# Patient Record
Sex: Female | Born: 1940 | Race: White | Hispanic: No | Smoking: Former smoker
Health system: Southern US, Community
[De-identification: ages and names within clinical notes are randomized; demographics above are authoritative.]

## PROBLEM LIST (undated history)

## (undated) DIAGNOSIS — I712 Thoracic aortic aneurysm, without rupture: Secondary | ICD-10-CM

## (undated) DIAGNOSIS — D649 Anemia, unspecified: Secondary | ICD-10-CM

## (undated) DIAGNOSIS — E119 Type 2 diabetes mellitus without complications: Secondary | ICD-10-CM

## (undated) DIAGNOSIS — R6889 Other general symptoms and signs: Secondary | ICD-10-CM

## (undated) DIAGNOSIS — J9601 Acute respiratory failure with hypoxia: Secondary | ICD-10-CM

## (undated) DIAGNOSIS — Z66 Do not resuscitate: Secondary | ICD-10-CM

## (undated) DIAGNOSIS — N390 Urinary tract infection, site not specified: Secondary | ICD-10-CM

## (undated) DIAGNOSIS — M1990 Unspecified osteoarthritis, unspecified site: Secondary | ICD-10-CM

## (undated) DIAGNOSIS — W19XXXA Unspecified fall, initial encounter: Secondary | ICD-10-CM

## (undated) DIAGNOSIS — L304 Erythema intertrigo: Secondary | ICD-10-CM

## (undated) DIAGNOSIS — K649 Unspecified hemorrhoids: Secondary | ICD-10-CM

## (undated) DIAGNOSIS — R918 Other nonspecific abnormal finding of lung field: Secondary | ICD-10-CM

## (undated) DIAGNOSIS — N3000 Acute cystitis without hematuria: Secondary | ICD-10-CM

## (undated) DIAGNOSIS — R911 Solitary pulmonary nodule: Secondary | ICD-10-CM

## (undated) DIAGNOSIS — F409 Phobic anxiety disorder, unspecified: Secondary | ICD-10-CM

## (undated) DIAGNOSIS — R410 Disorientation, unspecified: Secondary | ICD-10-CM

## (undated) DIAGNOSIS — I471 Supraventricular tachycardia: Secondary | ICD-10-CM

## (undated) DIAGNOSIS — E669 Obesity, unspecified: Secondary | ICD-10-CM

## (undated) DIAGNOSIS — R532 Functional quadriplegia: Secondary | ICD-10-CM

## (undated) DIAGNOSIS — Z593 Problems related to living in residential institution: Secondary | ICD-10-CM

## (undated) DIAGNOSIS — F32A Depression, unspecified: Secondary | ICD-10-CM

## (undated) DIAGNOSIS — J9602 Acute respiratory failure with hypercapnia: Secondary | ICD-10-CM

## (undated) DIAGNOSIS — F329 Major depressive disorder, single episode, unspecified: Secondary | ICD-10-CM

## (undated) DIAGNOSIS — I10 Essential (primary) hypertension: Secondary | ICD-10-CM

## (undated) DIAGNOSIS — A0811 Acute gastroenteropathy due to Norwalk agent: Secondary | ICD-10-CM

## (undated) DIAGNOSIS — I2721 Secondary pulmonary arterial hypertension: Secondary | ICD-10-CM

## (undated) DIAGNOSIS — M7752 Other enthesopathy of left foot: Secondary | ICD-10-CM

## (undated) DIAGNOSIS — D500 Iron deficiency anemia secondary to blood loss (chronic): Secondary | ICD-10-CM

## (undated) DIAGNOSIS — E785 Hyperlipidemia, unspecified: Secondary | ICD-10-CM

## (undated) DIAGNOSIS — R7303 Prediabetes: Secondary | ICD-10-CM

## (undated) HISTORY — DX: Iron deficiency anemia secondary to blood loss (chronic): D50.0

## (undated) HISTORY — DX: Major depressive disorder, single episode, unspecified: F32.9

## (undated) HISTORY — DX: Problems related to living in residential institution: Z59.3

## (undated) HISTORY — DX: Other enthesopathy of left foot and ankle: M77.52

## (undated) HISTORY — DX: Unspecified fall, initial encounter: W19.XXXA

## (undated) HISTORY — DX: Essential (primary) hypertension: I10

## (undated) HISTORY — DX: Hypercalcemia: E83.52

## (undated) HISTORY — DX: Urinary tract infection, site not specified: N39.0

## (undated) HISTORY — PX: KNEE SURGERY: SHX244

## (undated) HISTORY — DX: Unspecified osteoarthritis, unspecified site: M19.90

## (undated) HISTORY — DX: Hyperlipidemia, unspecified: E78.5

## (undated) HISTORY — DX: Do not resuscitate: Z66

## (undated) HISTORY — DX: Functional quadriplegia: R53.2

## (undated) HISTORY — DX: Type 2 diabetes mellitus without complications: E11.9

## (undated) HISTORY — DX: Acute respiratory failure with hypoxia: J96.02

## (undated) HISTORY — DX: Depression, unspecified: F32.A

## (undated) HISTORY — DX: Acute cystitis without hematuria: N30.00

## (undated) HISTORY — DX: Anemia, unspecified: D64.9

## (undated) HISTORY — DX: Obesity, unspecified: E66.9

## (undated) HISTORY — DX: Solitary pulmonary nodule: R91.1

## (undated) HISTORY — DX: Acute gastroenteropathy due to Norwalk agent: A08.11

## (undated) HISTORY — DX: Unspecified hemorrhoids: K64.9

## (undated) HISTORY — DX: Phobic anxiety disorder, unspecified: F40.9

## (undated) HISTORY — DX: Acute respiratory failure with hypoxia: J96.01

## (undated) HISTORY — DX: Disorientation, unspecified: R41.0

## (undated) HISTORY — DX: Other general symptoms and signs: R68.89

## (undated) HISTORY — DX: Erythema intertrigo: L30.4

---

## 2000-01-21 ENCOUNTER — Other Ambulatory Visit: Admission: RE | Admit: 2000-01-21 | Discharge: 2000-01-21 | Payer: Self-pay | Admitting: Obstetrics and Gynecology

## 2001-11-20 ENCOUNTER — Encounter: Admission: RE | Admit: 2001-11-20 | Discharge: 2002-02-18 | Payer: Self-pay | Admitting: Family Medicine

## 2004-05-12 ENCOUNTER — Encounter: Admission: RE | Admit: 2004-05-12 | Discharge: 2004-05-24 | Payer: Self-pay | Admitting: Family Medicine

## 2006-02-03 ENCOUNTER — Observation Stay (HOSPITAL_COMMUNITY): Admission: EM | Admit: 2006-02-03 | Discharge: 2006-02-04 | Payer: Self-pay | Admitting: Emergency Medicine

## 2006-05-30 ENCOUNTER — Inpatient Hospital Stay (HOSPITAL_COMMUNITY): Admission: EM | Admit: 2006-05-30 | Discharge: 2006-06-01 | Payer: Self-pay | Admitting: Emergency Medicine

## 2006-06-01 ENCOUNTER — Encounter (INDEPENDENT_AMBULATORY_CARE_PROVIDER_SITE_OTHER): Payer: Self-pay | Admitting: Specialist

## 2007-08-07 ENCOUNTER — Other Ambulatory Visit: Admission: RE | Admit: 2007-08-07 | Discharge: 2007-08-07 | Payer: Self-pay | Admitting: Obstetrics and Gynecology

## 2008-02-07 ENCOUNTER — Ambulatory Visit: Payer: Self-pay | Admitting: Oncology

## 2008-02-13 LAB — CBC WITH DIFFERENTIAL/PLATELET
BASO%: 0 % (ref 0.0–2.0)
EOS%: 2.8 % (ref 0.0–7.0)
HCT: 30.9 % — ABNORMAL LOW (ref 34.8–46.6)
LYMPH%: 11.2 % — ABNORMAL LOW (ref 14.0–48.0)
MCH: 27.8 pg (ref 26.0–34.0)
MCHC: 32.3 g/dL (ref 32.0–36.0)
MCV: 85.9 fL (ref 81.0–101.0)
MONO%: 5.3 % (ref 0.0–13.0)
NEUT%: 80.7 % — ABNORMAL HIGH (ref 39.6–76.8)
Platelets: 335 10*3/uL (ref 145–400)
RBC: 3.6 10*6/uL — ABNORMAL LOW (ref 3.70–5.32)

## 2008-02-18 LAB — COMPREHENSIVE METABOLIC PANEL
AST: 11 U/L (ref 0–37)
Alkaline Phosphatase: 108 U/L (ref 39–117)
BUN: 39 mg/dL — ABNORMAL HIGH (ref 6–23)
Creatinine, Ser: 1.37 mg/dL — ABNORMAL HIGH (ref 0.40–1.20)
Glucose, Bld: 123 mg/dL — ABNORMAL HIGH (ref 70–99)
Total Bilirubin: 0.3 mg/dL (ref 0.3–1.2)

## 2008-02-18 LAB — SPEP & IFE WITH QIG
Albumin ELP: 51.1 % — ABNORMAL LOW (ref 55.8–66.1)
Alpha-1-Globulin: 6.3 % — ABNORMAL HIGH (ref 2.9–4.9)
Alpha-2-Globulin: 13.7 % — ABNORMAL HIGH (ref 7.1–11.8)
Beta 2: 5.3 % (ref 3.2–6.5)
IgM, Serum: 90 mg/dL (ref 60–263)

## 2008-02-18 LAB — IRON AND TIBC
%SAT: 7 % — ABNORMAL LOW (ref 20–55)
Iron: 26 ug/dL — ABNORMAL LOW (ref 42–145)
TIBC: 349 ug/dL (ref 250–470)
UIBC: 323 ug/dL

## 2008-02-18 LAB — ERYTHROPOIETIN: Erythropoietin: 17.3 m[IU]/mL (ref 2.6–34.0)

## 2008-02-18 LAB — FERRITIN: Ferritin: 30 ng/mL (ref 10–291)

## 2008-04-21 ENCOUNTER — Ambulatory Visit: Payer: Self-pay | Admitting: Oncology

## 2008-05-09 LAB — COMPREHENSIVE METABOLIC PANEL
Albumin: 4 g/dL (ref 3.5–5.2)
BUN: 31 mg/dL — ABNORMAL HIGH (ref 6–23)
CO2: 26 mEq/L (ref 19–32)
Glucose, Bld: 108 mg/dL — ABNORMAL HIGH (ref 70–99)
Potassium: 4.4 mEq/L (ref 3.5–5.3)
Sodium: 143 mEq/L (ref 135–145)
Total Protein: 7.2 g/dL (ref 6.0–8.3)

## 2008-05-09 LAB — CBC WITH DIFFERENTIAL/PLATELET
Basophils Absolute: 0 10*3/uL (ref 0.0–0.1)
Eosinophils Absolute: 0.2 10*3/uL (ref 0.0–0.5)
HGB: 11.2 g/dL — ABNORMAL LOW (ref 11.6–15.9)
LYMPH%: 14.2 % (ref 14.0–49.7)
MCV: 94.6 fL (ref 79.5–101.0)
MONO#: 0.3 10*3/uL (ref 0.1–0.9)
MONO%: 4.8 % (ref 0.0–14.0)
NEUT#: 5.2 10*3/uL (ref 1.5–6.5)
Platelets: 283 10*3/uL (ref 145–400)
RBC: 3.57 10*6/uL — ABNORMAL LOW (ref 3.70–5.45)
RDW: 15 % — ABNORMAL HIGH (ref 11.2–14.5)
WBC: 6.8 10*3/uL (ref 3.9–10.3)

## 2008-05-09 LAB — IRON AND TIBC
Iron: 52 ug/dL (ref 42–145)
UIBC: 270 ug/dL

## 2008-05-09 LAB — FERRITIN: Ferritin: 66 ng/mL (ref 10–291)

## 2008-07-22 ENCOUNTER — Ambulatory Visit: Payer: Self-pay | Admitting: Oncology

## 2008-07-24 LAB — COMPREHENSIVE METABOLIC PANEL
AST: 11 U/L (ref 0–37)
Albumin: 4 g/dL (ref 3.5–5.2)
Alkaline Phosphatase: 89 U/L (ref 39–117)
Potassium: 4.2 mEq/L (ref 3.5–5.3)
Sodium: 138 mEq/L (ref 135–145)
Total Protein: 6.9 g/dL (ref 6.0–8.3)

## 2008-07-24 LAB — CBC WITH DIFFERENTIAL/PLATELET
EOS%: 4.9 % (ref 0.0–7.0)
MCH: 31.6 pg (ref 25.1–34.0)
MCV: 95.4 fL (ref 79.5–101.0)
MONO%: 5.6 % (ref 0.0–14.0)
NEUT#: 6.9 10*3/uL — ABNORMAL HIGH (ref 1.5–6.5)
RBC: 3.19 10*6/uL — ABNORMAL LOW (ref 3.70–5.45)
RDW: 13.4 % (ref 11.2–14.5)

## 2008-07-24 LAB — IRON AND TIBC: UIBC: 296 ug/dL

## 2008-10-24 ENCOUNTER — Ambulatory Visit: Payer: Self-pay | Admitting: Oncology

## 2008-10-28 LAB — COMPREHENSIVE METABOLIC PANEL
ALT: 10 U/L (ref 0–35)
BUN: 46 mg/dL — ABNORMAL HIGH (ref 6–23)
CO2: 21 mEq/L (ref 19–32)
Calcium: 10.2 mg/dL (ref 8.4–10.5)
Chloride: 105 mEq/L (ref 96–112)
Creatinine, Ser: 1.4 mg/dL — ABNORMAL HIGH (ref 0.40–1.20)
Glucose, Bld: 114 mg/dL — ABNORMAL HIGH (ref 70–99)

## 2008-10-28 LAB — CBC WITH DIFFERENTIAL/PLATELET
Basophils Absolute: 0 10*3/uL (ref 0.0–0.1)
EOS%: 2.4 % (ref 0.0–7.0)
HGB: 10.5 g/dL — ABNORMAL LOW (ref 11.6–15.9)
MCH: 31 pg (ref 25.1–34.0)
MCHC: 33.4 g/dL (ref 31.5–36.0)
MCV: 92.8 fL (ref 79.5–101.0)
MONO%: 4.7 % (ref 0.0–14.0)
NEUT%: 80.3 % — ABNORMAL HIGH (ref 38.4–76.8)
RDW: 14.4 % (ref 11.2–14.5)

## 2008-10-28 LAB — IRON AND TIBC
%SAT: 13 % — ABNORMAL LOW (ref 20–55)
Iron: 41 ug/dL — ABNORMAL LOW (ref 42–145)
UIBC: 284 ug/dL

## 2009-03-11 ENCOUNTER — Ambulatory Visit: Payer: Self-pay | Admitting: Oncology

## 2009-05-26 ENCOUNTER — Ambulatory Visit: Payer: Self-pay | Admitting: Oncology

## 2009-05-29 LAB — IRON AND TIBC
Iron: 23 ug/dL — ABNORMAL LOW (ref 42–145)
TIBC: 380 ug/dL (ref 250–470)
UIBC: 357 ug/dL

## 2009-05-29 LAB — COMPREHENSIVE METABOLIC PANEL
Albumin: 4.3 g/dL (ref 3.5–5.2)
Alkaline Phosphatase: 97 U/L (ref 39–117)
BUN: 40 mg/dL — ABNORMAL HIGH (ref 6–23)
Calcium: 10.4 mg/dL (ref 8.4–10.5)
Chloride: 103 mEq/L (ref 96–112)
Creatinine, Ser: 1.25 mg/dL — ABNORMAL HIGH (ref 0.40–1.20)
Glucose, Bld: 112 mg/dL — ABNORMAL HIGH (ref 70–99)
Potassium: 4.6 mEq/L (ref 3.5–5.3)

## 2009-05-29 LAB — CBC WITH DIFFERENTIAL/PLATELET
Basophils Absolute: 0 10*3/uL (ref 0.0–0.1)
EOS%: 2.8 % (ref 0.0–7.0)
Eosinophils Absolute: 0.3 10*3/uL (ref 0.0–0.5)
HCT: 29.6 % — ABNORMAL LOW (ref 34.8–46.6)
HGB: 9.9 g/dL — ABNORMAL LOW (ref 11.6–15.9)
MCH: 30.4 pg (ref 25.1–34.0)
MCV: 90.3 fL (ref 79.5–101.0)
MONO%: 4.7 % (ref 0.0–14.0)
NEUT#: 7 10*3/uL — ABNORMAL HIGH (ref 1.5–6.5)
NEUT%: 77.4 % — ABNORMAL HIGH (ref 38.4–76.8)
RDW: 13.8 % (ref 11.2–14.5)

## 2009-07-21 ENCOUNTER — Ambulatory Visit: Payer: Self-pay | Admitting: Oncology

## 2009-07-22 LAB — CBC WITH DIFFERENTIAL/PLATELET
BASO%: 0.2 % (ref 0.0–2.0)
EOS%: 2.1 % (ref 0.0–7.0)
HCT: 35.9 % (ref 34.8–46.6)
MCH: 30.1 pg (ref 25.1–34.0)
MCHC: 32.8 g/dL (ref 31.5–36.0)
MONO#: 0.6 10*3/uL (ref 0.1–0.9)
NEUT%: 72.8 % (ref 38.4–76.8)
RDW: 17.7 % — ABNORMAL HIGH (ref 11.2–14.5)
WBC: 8.9 10*3/uL (ref 3.9–10.3)
lymph#: 1.7 10*3/uL (ref 0.9–3.3)

## 2009-07-22 LAB — COMPREHENSIVE METABOLIC PANEL
ALT: 9 U/L (ref 0–35)
AST: 11 U/L (ref 0–37)
Albumin: 4.6 g/dL (ref 3.5–5.2)
CO2: 22 mEq/L (ref 19–32)
Calcium: 10.4 mg/dL (ref 8.4–10.5)
Chloride: 104 mEq/L (ref 96–112)
Potassium: 4.4 mEq/L (ref 3.5–5.3)
Sodium: 140 mEq/L (ref 135–145)
Total Protein: 7.9 g/dL (ref 6.0–8.3)

## 2009-07-22 LAB — IRON AND TIBC: TIBC: 300 ug/dL (ref 250–470)

## 2009-07-22 LAB — FERRITIN: Ferritin: 210 ng/mL (ref 10–291)

## 2009-08-31 ENCOUNTER — Ambulatory Visit: Payer: Self-pay | Admitting: Oncology

## 2009-09-02 LAB — CBC WITH DIFFERENTIAL/PLATELET
BASO%: 0.2 % (ref 0.0–2.0)
LYMPH%: 14.9 % (ref 14.0–49.7)
MCHC: 33.8 g/dL (ref 31.5–36.0)
MCV: 91.7 fL (ref 79.5–101.0)
MONO%: 5.7 % (ref 0.0–14.0)
NEUT%: 76.9 % — ABNORMAL HIGH (ref 38.4–76.8)
Platelets: 271 10*3/uL (ref 145–400)
RBC: 3.98 10*6/uL (ref 3.70–5.45)

## 2009-09-02 LAB — IRON AND TIBC
%SAT: 15 % — ABNORMAL LOW (ref 20–55)
UIBC: 265 ug/dL

## 2009-09-02 LAB — FERRITIN: Ferritin: 102 ng/mL (ref 10–291)

## 2009-09-02 LAB — COMPREHENSIVE METABOLIC PANEL
ALT: <8 U/L (ref 0–35)
Alkaline Phosphatase: 87 U/L (ref 39–117)
Creatinine, Ser: 1.2 mg/dL (ref 0.40–1.20)
Glucose, Bld: 114 mg/dL — ABNORMAL HIGH (ref 70–99)
Sodium: 139 mEq/L (ref 135–145)
Total Bilirubin: 0.3 mg/dL (ref 0.3–1.2)
Total Protein: 7.7 g/dL (ref 6.0–8.3)

## 2009-11-06 ENCOUNTER — Ambulatory Visit: Payer: Self-pay | Admitting: Oncology

## 2009-11-10 LAB — CBC WITH DIFFERENTIAL/PLATELET
BASO%: 0.4 % (ref 0.0–2.0)
EOS%: 2.7 % (ref 0.0–7.0)
HCT: 35.4 % (ref 34.8–46.6)
LYMPH%: 17.2 % (ref 14.0–49.7)
MCH: 31.5 pg (ref 25.1–34.0)
MCHC: 33.2 g/dL (ref 31.5–36.0)
MONO#: 0.4 10*3/uL (ref 0.1–0.9)
NEUT%: 73.7 % (ref 38.4–76.8)
Platelets: 259 10*3/uL (ref 145–400)
RBC: 3.74 10*6/uL (ref 3.70–5.45)
WBC: 7.4 10*3/uL (ref 3.9–10.3)

## 2009-11-10 LAB — COMPREHENSIVE METABOLIC PANEL
ALT: 9 U/L (ref 0–35)
AST: 14 U/L (ref 0–37)
Albumin: 4.2 g/dL (ref 3.5–5.2)
Alkaline Phosphatase: 88 U/L (ref 39–117)
Calcium: 10.1 mg/dL (ref 8.4–10.5)
Creatinine, Ser: 1.36 mg/dL — ABNORMAL HIGH (ref 0.40–1.20)
Sodium: 140 mEq/L (ref 135–145)
Total Bilirubin: 0.4 mg/dL (ref 0.3–1.2)
Total Protein: 7.2 g/dL (ref 6.0–8.3)

## 2009-11-10 LAB — IRON AND TIBC
Iron: 65 ug/dL (ref 42–145)
UIBC: 254 ug/dL

## 2009-11-10 LAB — FERRITIN: Ferritin: 92 ng/mL (ref 10–291)

## 2009-12-28 ENCOUNTER — Ambulatory Visit: Payer: Self-pay | Admitting: Oncology

## 2009-12-30 LAB — COMPREHENSIVE METABOLIC PANEL
ALT: 11 U/L (ref 0–35)
AST: 13 U/L (ref 0–37)
Alkaline Phosphatase: 92 U/L (ref 39–117)
BUN: 28 mg/dL — ABNORMAL HIGH (ref 6–23)
Chloride: 102 mEq/L (ref 96–112)
Creatinine, Ser: 1.17 mg/dL (ref 0.40–1.20)
Total Bilirubin: 0.6 mg/dL (ref 0.3–1.2)

## 2009-12-30 LAB — CBC WITH DIFFERENTIAL/PLATELET
BASO%: 0.4 % (ref 0.0–2.0)
Basophils Absolute: 0 10*3/uL (ref 0.0–0.1)
EOS%: 1.5 % (ref 0.0–7.0)
HCT: 36.3 % (ref 34.8–46.6)
LYMPH%: 15.9 % (ref 14.0–49.7)
MCH: 32.3 pg (ref 25.1–34.0)
MCHC: 34 g/dL (ref 31.5–36.0)
MCV: 95 fL (ref 79.5–101.0)
MONO%: 6.5 % (ref 0.0–14.0)
NEUT%: 75.7 % (ref 38.4–76.8)
lymph#: 1.4 10*3/uL (ref 0.9–3.3)

## 2009-12-30 LAB — IRON AND TIBC
%SAT: 17 % — ABNORMAL LOW (ref 20–55)
TIBC: 324 ug/dL (ref 250–470)

## 2009-12-30 LAB — FERRITIN: Ferritin: 74 ng/mL (ref 10–291)

## 2010-04-12 ENCOUNTER — Encounter: Payer: Self-pay | Admitting: Family Medicine

## 2010-05-05 ENCOUNTER — Other Ambulatory Visit: Payer: Self-pay | Admitting: Oncology

## 2010-05-05 ENCOUNTER — Encounter (HOSPITAL_BASED_OUTPATIENT_CLINIC_OR_DEPARTMENT_OTHER): Payer: Medicare Other | Admitting: Oncology

## 2010-05-05 DIAGNOSIS — D509 Iron deficiency anemia, unspecified: Secondary | ICD-10-CM

## 2010-05-05 DIAGNOSIS — D649 Anemia, unspecified: Secondary | ICD-10-CM

## 2010-05-05 DIAGNOSIS — N289 Disorder of kidney and ureter, unspecified: Secondary | ICD-10-CM

## 2010-05-05 DIAGNOSIS — D638 Anemia in other chronic diseases classified elsewhere: Secondary | ICD-10-CM

## 2010-05-05 LAB — CBC WITH DIFFERENTIAL/PLATELET
Basophils Absolute: 0 10*3/uL (ref 0.0–0.1)
Eosinophils Absolute: 0.3 10*3/uL (ref 0.0–0.5)
HGB: 12.9 g/dL (ref 11.6–15.9)
MCV: 93.5 fL (ref 79.5–101.0)
MONO%: 6.1 % (ref 0.0–14.0)
NEUT#: 6.1 10*3/uL (ref 1.5–6.5)
RBC: 4.14 10*6/uL (ref 3.70–5.45)
RDW: 14.2 % (ref 11.2–14.5)
WBC: 8.4 10*3/uL (ref 3.9–10.3)
lymph#: 1.4 10*3/uL (ref 0.9–3.3)

## 2010-05-05 LAB — IRON AND TIBC
%SAT: 16 % — ABNORMAL LOW (ref 20–55)
Iron: 56 ug/dL (ref 42–145)
UIBC: 287 ug/dL

## 2010-05-05 LAB — FERRITIN: Ferritin: 48 ng/mL (ref 10–291)

## 2010-08-06 NOTE — H&P (Signed)
[name], [name]                 ACCOUNT NO.:  [id number]   MEDICAL RECORD NO.:  [id number]          [name] TYPE:  INP   LOCATION:  1416                         FACILITY:  [facility]   PHYSICIAN:  Andres Shad. Lantos, MD     DATE OF BIRTH:  10/22/1940   DATE OF PROCEDURE:  DATE OF DISCHARGE:                    STAT - MUST CHANGE TO CORRECT WORK TYPE   CHIEF COMPLAINT:  Shortness of breath and severe anemia.   HISTORY OF PRESENT ILLNESS:  [name] is a 70 year old female who  presented to her primary care physician today for a physical.  During  the physical, she complained of being very short of breath, especially  with exertion over the last month and a half.  He performed some blood  work and found that she had a hemoglobin of 3.7 and referred her for  admission.  The [name] was an inpatient on the [facility] in November 2007 with a similar complaint.  At that time she was  found to have a hemoglobin of approximately 7 and heme-positive stools.  The [name] was having bloody bowel movements at the time that she  attributed to external hemorrhoids and a rectal prolapse.  She was seen  by gastroenterology during that admission who felt that she was most  likely having chronic blood loss from her hemorrhoids and referred her  for an outpatient colonoscopy.  She was transfused during that admission  and discharged in stable condition.  However, her husband passed away  several days after she was discharged and she was unable to keep her  colonoscopy appointment.  She has been physically reasonably well since  that time until about one to one and a half months ago when she began to  have progressive shortness of breath, especially with minimal exertion.  She has always felt short of breath while lying flat on her back and  feels that this is unchanged.  She more recently has been short of  breath even at rest.   REVIEW OF SYSTEMS:  Positive for headache, especially when  she is lying  supine.  Positive for cold intolerance.  Negative for diarrhea, negative  for blood in her stools, negative for hematemesis, negative for melena.   PAST MEDICAL HISTORY:  1. Diet controlled diabetes mellitus.  2. Stress incontinence.  3. Hypertension.  4. Gastroesophageal reflux disease.  5. Hyperlipidemia.  6. Diverticulosis.  7. Obesity.  8. Osteoarthritis.  9. Anxiety and depression.  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   MEDICATIONS:  1. Maxzide 75/50 mg p.o. daily.  2. Tylenol 1 g p.o. q.6 h. p.r.n.  3. Methadone 5 mg p.o. q.i.d. p.r.n.  4. Tenex 2 mg p.o. daily.  5. Diovan 80 mg p.o. daily.  6. MiraLax 17 g p.o. daily.   ALLERGIES/INTOLERANCES:  1. PENICILLIN.  2. ASPIRIN.  3. DARVOCET.  4. HYDROCODONE.  5. CODEINE.  6. LIPITOR.  7. STADOL.  8. AVANDIA.  9. ACTOS.  10.ZETIA.  11.ACE INHIBITORS.  12.WELCHOL.   SOCIAL HISTORY:  The [name] does not smoke.  She denies drinking and  drug use.  She is a  former nurses' aide.   PHYSICAL EXAMINATION:  VITAL SIGNS:  Temperature 98.4, pulse 73,  respiratory rate 20, blood pressure 130/48, oxygen saturation 94-96% on  room air.  Weight 113 kg.  Height 60 inches.  GENERAL APPEARANCE:  The [name] was an obese, Caucasian female, who was  quite pale.  She was alert, oriented and in no apparent distress.  HEENT:  Mucous membranes were pale and moist.  NECK:  There was no jugular venous distension.  CHEST:  Clear to auscultation bilaterally.  CARDIOVASCULAR:  Regular.  Hyperdynamic.  No murmurs, rubs or gallops.  ABDOMEN:  Obese.  Normal bowel sounds.  Soft, nontender, nondistended.  EXTREMITIES:  No edema.  Warm. Well perfused.  RECTAL:  The [name] had three large, visible external hemorrhoids.  None  of the hemorrhoids were bleeding and none were thrombosed.   LABORATORY DATA:  White blood cell count 15.2, hemoglobin 4.1,  hematocrit 13.1, platelets 514,000, 86% polys, 10% lymphocytes, 4%  monocytes  and MCV 71.6, RDW 22.5.  Blood smear showed polychromasia with  reticulocyte count of 2.9%.  Coagulation studies were normal.  Electrolytes were within normal limits.  Creatinine was 1.35. The  previous one checked at this hospital was 1.2 in November.   ASSESSMENT/PLAN:  1. Severe anemia.  This is most likely an iron deficiency anemia due      to chronic blood loss. This is suggestive by her very low MCV and      very elevated RDW.  Plan:  I will transfuse the [name] 4 units of      packed red blood cells and give a dose of Lasix to prevent volume      overload.  I will begin the [name] on an iron supplement, 325 mg      twice daily of iron sulfate.  I will also consult gastroenterology.      Additionally, the [name] will remain on her daily MiraLax which      she says prevents her hemorrhoids from bleeding and will begin her      on a proton pump inhibitor.  2. Hypertension.  This is stable and will be monitored.  3. Diabetes mellitus.  The [name] is requesting a regular diet and      says that she is ordinarily under good control.  I will give her      normal diet but check blood sugars four times a daily and restrict      her diet if these become elevated.      Andres Shad. Rudean Curt, MD  Electronically Signed     PML/MEDQ  D:  05/30/2006  T:  06/01/2006  Job:  [id number]   cc:   Fayrene Fearing L. Malon Kindle., M.D.  Fax: [phone]   Chales Salmon. Abigail Miyamoto, M.D.  Fax: [phone]

## 2010-08-06 NOTE — Op Note (Signed)
[name], [name]                 ACCOUNT NO.:  [id number]   MEDICAL RECORD NO.:  [id number]          PATIENT TYPE:  INP   LOCATION:  1416                         FACILITY:  [facility]   PHYSICIAN:  Bernette Redbird, M.D.   DATE OF BIRTH:  1940/12/19   DATE OF PROCEDURE:  06/01/2006  DATE OF DISCHARGE:  06/01/2006                               OPERATIVE REPORT   PROCEDURE:  Upper endoscopy with biopsies.   INDICATIONS:  Profound microcytic anemia in a 70 year old female with a  prior history of recurrent anemia, she is not on any ulcerogenic  medications.   FINDINGS:  Minimal hiatal hernia with Schatzki's ring.   DESCRIPTION OF PROCEDURE:  The nature, purpose, and risks of the  procedure had been discussed with the patient who provided written  consent.  Sedation was per anesthesia and included propofol, Versed, and  a short acting form of a fentanyl derivative.  The Pentax video  endoscope was passed under direct vision.  The vocal cords looked  grossly normal.  The esophagus was readily entered and was  endoscopically normal except for a widely patent esophageal mucosal ring  at the squamocolumnar junction, below which was a 2 cm hiatal hernia.  No reflux esophagitis, Barrett's esophagus, varices, infection or  neoplasia were seen.   The stomach contained a small bilious residual.  No significant  gastritis or any focal lesion such as erosions, ulcers, polyps or masses  were seen.  The pylorus, duodenal bulb and second duodenum looked  normal.  Duodenal biopsies were obtained to help exclude celiac disease,  amyloidosis, etc.  The endoscope was then removed from the patient who  tolerated the procedure well without apparent complication.   IMPRESSION:  Essentially normal endoscopy without source of anemia seen.  Small hiatal hernia and Schatzki's ring not felt to be clinically  significant.   PLAN:  Await pathology and duodenal biopsies and proceed to colonoscopic   evaluation.           ______________________________  Bernette Redbird, M.D.     RB/MEDQ  D:  06/01/2006  T:  06/02/2006  Job:  [id number]   cc:   Chales Salmon. Abigail Miyamoto, M.D.  Fax: [phone]

## 2010-08-06 NOTE — Discharge Summary (Signed)
[name], [name]                 ACCOUNT NO.:  [id number]   MEDICAL RECORD NO.:  [id number]          PATIENT TYPE:  OBV   LOCATION:  1603                         FACILITY:  [facility]   PHYSICIAN:  Theone Stanley, MD   DATE OF BIRTH:  [birth year]   DATE OF ADMISSION:  02/03/2006  DATE OF DISCHARGE:  02/04/2006                               DISCHARGE SUMMARY   ADMISSION DIAGNOSES:  1. Critical anemia.  2. Hypertension.  3. Gastroesophageal reflux disease.  4. Hyperlipidemia.  5. Diabetes type 2.  6. Morbid obesity.  7. Osteoarthritis.  8. Degenerative disk disease.   DISCHARGE DIAGNOSES:  1. Critical anemia secondary to gastrointestinal loss, most likely      from hemorrhoids.  2. Hypertension.  3. Gastroesophageal reflux disease.  4. Hyperlipidemia.  5. Diabetes type 2.  6. Morbid obesity.  7. Osteoarthritis.  8. Degenerative disk disease.   CONSULTATIONS:  None.   PROCEDURES/DIAGNOSTIC TESTS:  None.   HOSPITAL COURSE:  Ms. Korf is a very pleasant 70 year old Caucasian  female who presents to her primary care physician's office for a routine  exam where lab work was obtained.  It was noted that she has critical  anemia.  She was asked to return to the office for confirmatory repeat  CBC, which also indicated that she had critical anemia.  Although she  was not very symptomatic with shortness of breath, she did have some  tachycardia on presentation to the ER.  She was transfused 2 units.  Her  hemoglobin came up to 9.7.  I was able to talk with Dr. Ewing Schlein.  Because  there was no urgent need at this point in time, it was felt she could  have a follow-up colonoscopy as an outpatient.  In addition, patient was  very anxious to go home because her husband recently had surgery, and  she was very worried about him.  She is a very reliable patient, and it  was felt that she could follow up with Dr. Randa Evens on an outpatient  basis for a colonoscopy since her last one was about  5-6 years ago.   Follow up with Dr. Abigail Miyamoto in 1-2 weeks, Dr. Randa Evens in 2-3 weeks.   DISCHARGE MEDICATIONS:  1. Maxzide 75/50 1 p.o. daily.  2. Diovan 80 mg 1 p.o. daily.  3. Tenex 2 mg 1 p.o. daily.  4. Methadone 5 mg q.i.d.      Theone Stanley, MD  Electronically Signed     AEJ/MEDQ  D:  02/04/2006  T:  02/04/2006  Job:  [id number]   cc:   Chales Salmon. Abigail Miyamoto, M.D.  Fax: [phone]   Llana Aliment. Malon Kindle., M.D.  Fax: [phone]

## 2010-08-06 NOTE — Consult Note (Signed)
[name], [name]                 ACCOUNT NO.:  [id number]   MEDICAL RECORD NO.:  [id number]          PATIENT TYPE:  INP   LOCATION:  1416                         FACILITY:  [facility]   PHYSICIAN:  Bernette Redbird, M.D.   DATE OF BIRTH:  10-Mar-1941   DATE OF CONSULTATION:  05/30/2006  DATE OF DISCHARGE:                                 CONSULTATION   GASTROENTEROLOGY CONSULTATION   HISTORY OF PRESENT ILLNESS:  Dr. Rudean Curt of the [facility] hospitalists asked  [name] to see this pleasant 70 year old female because of profound anemia.   [name] was admitted to the hospital, today, from her physician's  office where she had been found to have a hemoglobin of about 3.6; and,  on admission here, it was 4.1.  She had had critical anemia back in  November; and was transfused 2 units with a discharge hemoglobin of 9.7;  I do not have the value for what that actual hemoglobin was.  Unfortunately, the patient's husband passed away shortly, thereafter,  and her plans for follow-up colonoscopy got put on hold; and she  presented to Dr. Recardo Evangelist office today with weakness and shortness of  breath; and was found to have the above-mentioned low hemoglobin.   Of note, the patient has longstanding small-volume hematochezia  attributed to hemorrhoids.  The blood occurs only on the commode, not at  other times.  She has had a tendency toward constipation which is well  controlled by MiraLax.  The patient was seen by Dr. Randa Evens a year ago  with the rectal bleeding and colonoscopy was recommended at that time;  but she did not follow through, apparently due to looking after her  husband.   She is not having any problem with abdominal pain.  She does have a fair  amount of dyspeptic symptoms, but is not on prescription medication for  that.  She does take Tums regularly.  She is not on any ulcerogenic  medications.   The patient had colonoscopy by Dr. Randa Evens 10 years ago in 1997, at  which time  diverticulosis and internal hemorrhoids were noted, but no  polyps.   ALLERGIES/INTOLERANCE:  To multiple medications including PENICILLIN,  LIPITOR, STADOL, ASPIRIN, AVANDIA, ACTOS, CODEINE, WELCHOL, ACE  inhibitors, ZETIA, VICODIN and DARVOCET.   OUTPATIENT MEDICATIONS PRIOR TO ADMISSION INCLUDED:  Maxzide, methadone,  Tenex and Diovan.   OPERATIONS INCLUDE:  Hysterectomy, a subsequent bladder suspension  procedure, also umbilical hernia repair on multiple occasions with mesh  on the latter occasion; and she has had some sort of orthopedic surgery.   MEDICAL ILLNESSES INCLUDE:  1. Type 2 diabetes currently diet controlled.  2. Recurrent anemia.  3. GERD symptoms.  4. Hypertension.  5. Hyperlipidemia.  6. Morbid obesity.  7. Peripheral neuropathy.  8. Stress urinary incontinence.   HABITS:  Nonsmoker, nondrinker.   FAMILY HISTORY:  Negative for GI illnesses such as colon cancer or ulcer  disease.   SOCIAL HISTORY:  As noted, the patient has just been widowed for the  past several months.  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   PHYSICAL EXAM:  GENERAL:  A morbidly obese very pleasant, articulate  Caucasian female in no evident distress.  She is anicteric.  Despite her  profound anemia, she does not really look all that pale.  She has  received part of 1 unit of blood so far.  CHEST:  Clear to auscultation.  HEART:  Normal without gallops, rubs, murmurs, clicks, or arrhythmias.  ABDOMEN:  Morbidly obese but I cannot discern any discrete ventral  hernia.  RECTAL:  I did not repeat a rectal exam, but when the physician  assistant did it earlier, there did appear to be just a tinge of mucoid  blood, with no stool present.  The residue was strongly guaiac positive.   LABS:  White count 15,200 with 86 polys, 10 lymphs, and 4 monocytes.  Hemoglobin 4.1 with an MCV of 72, platelets 514,000, RDW elevated at  22.5.  INR  normal at 1.1.  Chemistry panel pertinent for BUN 34,  creatinine 1.4.  Liver chemistries not checked glucose 117.   IMPRESSION:  1. Chronic recurrent microcytic anemia.  2. Longstanding small-volume hematochezia.  3. Recurrent small volume hematochezia most likely due to hemorrhoids.  4. Multiple medical problems including morbid obesity.   DISCUSSION:  I do not think the patient's small volume hematochezia  accounts for such profound anemia.  I thus suspect either chronic  gastrointestinal blood loss from a lower gastrointestinal tract lesion  such as a tumor, or alternatively, perhaps an upper tract source such as  a large hiatal hernia.   PLAN:  The patient will be transfused, tonight, undergo a colonoscopy  prep tomorrow; and then undergo colonoscopy, hopefully under propofol  sedation, the day after.  I have reviewed the nature, purpose, and risks  of the procedures with the patient and she is agreeable.           ______________________________  Bernette Redbird, M.D.     RB/MEDQ  D:  05/30/2006  T:  06/01/2006  Job:  [id number]   cc:   Chales Salmon. Abigail Miyamoto, M.D.  Fax: [phone]   Llana Aliment. Malon Kindle., M.D.  Fax: [phone]

## 2010-08-12 ENCOUNTER — Other Ambulatory Visit: Payer: Self-pay | Admitting: Medical

## 2010-08-12 ENCOUNTER — Encounter (HOSPITAL_BASED_OUTPATIENT_CLINIC_OR_DEPARTMENT_OTHER): Payer: Medicare Other | Admitting: Oncology

## 2010-08-12 DIAGNOSIS — D509 Iron deficiency anemia, unspecified: Secondary | ICD-10-CM

## 2010-08-12 DIAGNOSIS — D649 Anemia, unspecified: Secondary | ICD-10-CM

## 2010-08-12 DIAGNOSIS — N289 Disorder of kidney and ureter, unspecified: Secondary | ICD-10-CM

## 2010-08-12 DIAGNOSIS — D638 Anemia in other chronic diseases classified elsewhere: Secondary | ICD-10-CM

## 2010-08-12 LAB — CBC WITH DIFFERENTIAL/PLATELET
BASO%: 0.8 % (ref 0.0–2.0)
EOS%: 3.1 % (ref 0.0–7.0)
LYMPH%: 15.9 % (ref 14.0–49.7)
MCHC: 32.9 g/dL (ref 31.5–36.0)
MCV: 91.5 fL (ref 79.5–101.0)
MONO%: 5.2 % (ref 0.0–14.0)
Platelets: 256 10*3/uL (ref 145–400)
RBC: 4.1 10*6/uL (ref 3.70–5.45)
RDW: 14.4 % (ref 11.2–14.5)

## 2010-08-12 LAB — IRON AND TIBC
%SAT: 13 % — ABNORMAL LOW (ref 20–55)
Iron: 45 ug/dL (ref 42–145)

## 2010-08-12 LAB — FERRITIN: Ferritin: 44 ng/mL (ref 10–291)

## 2010-11-16 ENCOUNTER — Encounter (HOSPITAL_BASED_OUTPATIENT_CLINIC_OR_DEPARTMENT_OTHER): Payer: Medicare Other | Admitting: Oncology

## 2010-11-16 ENCOUNTER — Other Ambulatory Visit: Payer: Self-pay | Admitting: Oncology

## 2010-11-16 DIAGNOSIS — D649 Anemia, unspecified: Secondary | ICD-10-CM

## 2010-11-16 DIAGNOSIS — D509 Iron deficiency anemia, unspecified: Secondary | ICD-10-CM

## 2010-11-16 DIAGNOSIS — N289 Disorder of kidney and ureter, unspecified: Secondary | ICD-10-CM

## 2010-11-16 DIAGNOSIS — D638 Anemia in other chronic diseases classified elsewhere: Secondary | ICD-10-CM

## 2010-11-16 LAB — COMPREHENSIVE METABOLIC PANEL
Albumin: 4.3 g/dL (ref 3.5–5.2)
BUN: 24 mg/dL — ABNORMAL HIGH (ref 6–23)
Calcium: 10.3 mg/dL (ref 8.4–10.5)
Chloride: 100 mEq/L (ref 96–112)
Glucose, Bld: 102 mg/dL — ABNORMAL HIGH (ref 70–99)
Potassium: 4 mEq/L (ref 3.5–5.3)

## 2010-11-16 LAB — IRON AND TIBC
TIBC: 369 ug/dL (ref 250–470)
UIBC: 312 ug/dL (ref 125–400)

## 2010-11-16 LAB — FERRITIN: Ferritin: 48 ng/mL (ref 10–291)

## 2010-11-16 LAB — CBC WITH DIFFERENTIAL/PLATELET
Basophils Absolute: 0 10*3/uL (ref 0.0–0.1)
EOS%: 2.6 % (ref 0.0–7.0)
HCT: 40.8 % (ref 34.8–46.6)
HGB: 13.2 g/dL (ref 11.6–15.9)
LYMPH%: 16.6 % (ref 14.0–49.7)
MCH: 29.2 pg (ref 25.1–34.0)
MONO#: 0.7 10*3/uL (ref 0.1–0.9)
NEUT%: 73.2 % (ref 38.4–76.8)
Platelets: 257 10*3/uL (ref 145–400)
lymph#: 1.6 10*3/uL (ref 0.9–3.3)

## 2011-01-31 ENCOUNTER — Telehealth: Payer: Self-pay | Admitting: Oncology

## 2011-01-31 NOTE — Telephone Encounter (Signed)
S/w pt, advised 11/29 appt has been cancelled due to Epic Go-Live. Advised pt, we will call her later with a new appt. Pt verbalize understanding.

## 2011-02-05 ENCOUNTER — Telehealth: Payer: Self-pay | Admitting: Oncology

## 2011-02-05 NOTE — Telephone Encounter (Signed)
S/w pt, gave appt 03/31/11 @ 9am r/s'd from 11/29 appt (cx'd due to Epic).

## 2011-02-08 ENCOUNTER — Telehealth: Payer: Self-pay | Admitting: Oncology

## 2011-02-08 NOTE — Telephone Encounter (Signed)
Returned pt's call and r/s 1/10 appt to 1/17 @ 9 am.  D/t per pt.

## 2011-03-31 ENCOUNTER — Other Ambulatory Visit: Payer: Medicare Other | Admitting: Lab

## 2011-03-31 ENCOUNTER — Ambulatory Visit: Payer: Medicare Other | Admitting: Oncology

## 2011-04-04 ENCOUNTER — Encounter: Payer: Self-pay | Admitting: *Deleted

## 2011-04-07 ENCOUNTER — Ambulatory Visit: Payer: Medicare Other | Admitting: Oncology

## 2011-04-07 ENCOUNTER — Other Ambulatory Visit: Payer: Medicare Other | Admitting: Lab

## 2011-05-25 ENCOUNTER — Other Ambulatory Visit (HOSPITAL_BASED_OUTPATIENT_CLINIC_OR_DEPARTMENT_OTHER): Payer: Medicare Other | Admitting: Lab

## 2011-05-25 ENCOUNTER — Ambulatory Visit (HOSPITAL_BASED_OUTPATIENT_CLINIC_OR_DEPARTMENT_OTHER): Payer: Medicare Other | Admitting: Oncology

## 2011-05-25 VITALS — BP 151/90 | HR 93 | Temp 98.3°F

## 2011-05-25 DIAGNOSIS — D509 Iron deficiency anemia, unspecified: Secondary | ICD-10-CM

## 2011-05-25 DIAGNOSIS — D649 Anemia, unspecified: Secondary | ICD-10-CM

## 2011-05-25 DIAGNOSIS — K649 Unspecified hemorrhoids: Secondary | ICD-10-CM

## 2011-05-25 LAB — CBC WITH DIFFERENTIAL/PLATELET
BASO%: 0.2 % (ref 0.0–2.0)
EOS%: 1.6 % (ref 0.0–7.0)
HCT: 38.3 % (ref 34.8–46.6)
LYMPH%: 16.8 % (ref 14.0–49.7)
MCH: 30.1 pg (ref 25.1–34.0)
MCHC: 33.1 g/dL (ref 31.5–36.0)
NEUT%: 75.9 % (ref 38.4–76.8)
Platelets: 261 10*3/uL (ref 145–400)
RBC: 4.22 10*6/uL (ref 3.70–5.45)
WBC: 9 10*3/uL (ref 3.9–10.3)
lymph#: 1.5 10*3/uL (ref 0.9–3.3)

## 2011-05-25 LAB — FERRITIN: Ferritin: 38 ng/mL (ref 10–291)

## 2011-05-25 NOTE — Progress Notes (Signed)
Hematology and Oncology Follow Up Visit  [name] [id number] [birth year] 71 y.o. 05/25/2011 10:24 AM  CC: [name]. [name], M.D.    Principle Diagnosis: This is a 71 year old with an iron-deficiency anemia.   Prior Therapy:  Status post total iron replacement with Feraheme in March 2011 as well as 2 treatment on 24th and March 31.   Interim History: [name] presents today for a followup visit.  She has continued to do very well without any evidence to suggest a worsening iron-deficiency.  She did not report any shortness of breath and did not report any chest pain.  She did report an increase in hemorrhoidal bleeding intermittently but otherwise did not report any hematochezia, did not report any melena.  Overall, performance status and activity level remains stable.  Medications: I have reviewed the patient's current medications. Current outpatient prescriptions:acetaminophen (TYLENOL EX ST ARTHRITIS PAIN) 500 MG tablet, Take 500 mg by mouth 2 (two) times daily as needed., Disp: , Rfl: ;  cholecalciferol (VITAMIN D) 1000 UNITS tablet, Take 2,000 Units by mouth 2 (two) times daily., Disp: , Rfl: ;  Cyanocobalamin (VITAMIN B-12 IJ), Inject 500 mcg as directed once a week., Disp: , Rfl: ;  docusate calcium (SURFAK) 240 MG capsule, Take 240 mg by mouth as needed., Disp: , Rfl:  docusate sodium (COLACE) 100 MG capsule, Take 100 mg by mouth daily., Disp: , Rfl: ;  guanFACINE (TENEX) 2 MG tablet, Take 2 mg by mouth at bedtime., Disp: , Rfl: ;  hydrochlorothiazide (HYDRODIURIL) 25 MG tablet, Take 25 mg by mouth daily., Disp: , Rfl: ;  polyethylene glycol (MIRALAX / GLYCOLAX) packet, Take 17 g by mouth daily., Disp: , Rfl: ;  valsartan (DIOVAN) 80 MG tablet, Take 80 mg by mouth daily., Disp: , Rfl:   Allergies:  Allergies  Allergen Reactions  . Ace Inhibitors   . Aspirin   . Atorvastatin   . Codeine Sulfate   . Colesevelam Hcl   . Estradiol   . Ezetimibe   . Penicillins   . Pioglitazone   .  Rosiglitazone Maleate   . Statins     Past Medical History, Surgical history, Social history, and Family History were reviewed and updated.  Review of Systems: Constitutional:  Negative for fever, chills, night sweats, anorexia, weight loss, pain. Cardiovascular: no chest pain or dyspnea on exertion Respiratory: negative Neurological: negative Dermatological: negative ENT: negative Skin: Negative. Gastrointestinal: negative Genito-Urinary: negative Hematological and Lymphatic: negative Breast: negative Musculoskeletal: negative Remaining ROS negative. Physical Exam: Blood pressure 151/90, pulse 93, temperature 98.3 F (36.8 C), temperature source Oral. ECOG: 1 General appearance: alert Head: Normocephalic, without obvious abnormality, atraumatic Neck: no adenopathy, no carotid bruit, no JVD, supple, symmetrical, trachea midline and thyroid not enlarged, symmetric, no tenderness/mass/nodules Lymph nodes: Cervical, supraclavicular, and axillary nodes normal. Heart:regular rate and rhythm, S1, S2 normal, no murmur, click, rub or gallop Lung:chest clear, no wheezing, rales, normal symmetric air entry Abdomin: soft, non-tender, without masses or organomegaly EXT:no erythema, induration, or nodules   Lab Results: Lab Results  Component Value Date   WBC 9.0 05/25/2011   HGB 12.7 05/25/2011   HCT 38.3 05/25/2011   MCV 90.9 05/25/2011   PLT 261 05/25/2011     Chemistry      Component Value Date/Time   NA 142 11/16/2010 0926   K 4.0 11/16/2010 0926   CL 100 11/16/2010 0926   CO2 28 11/16/2010 0926   BUN 24* 11/16/2010 0926   CREATININE 1.09 11/16/2010 0926  Component Value Date/Time   CALCIUM 10.3 11/16/2010 0926   ALKPHOS 94 11/16/2010 0926   AST 16 11/16/2010 0926   ALT 11 11/16/2010 0926   BILITOT 0.5 11/16/2010 0926        Impression and Plan:  A pleasant 71 year old female with the following issues:  1. Iron-deficiency anemia.  Her hemoglobin continued to be adequate at  this time, within normal range.  Her last set of iron studies in 10/2010 showed a ferritin was 48 and iron level 57.  All within normal range at this time.  We will continue to monitor every 6 month basis. 2. Hemorrhoids.  No bleeding noted.  3. Followup will be in 6 months.  [facility], MD 3/6/201310:24 AM

## 2011-12-08 ENCOUNTER — Other Ambulatory Visit: Payer: Medicare Other | Admitting: Lab

## 2011-12-08 ENCOUNTER — Ambulatory Visit: Payer: Medicare Other | Admitting: Oncology

## 2014-08-29 DIAGNOSIS — R911 Solitary pulmonary nodule: Secondary | ICD-10-CM | POA: Insufficient documentation

## 2014-11-28 ENCOUNTER — Emergency Department (HOSPITAL_COMMUNITY): Payer: Medicare Other

## 2014-11-28 ENCOUNTER — Inpatient Hospital Stay (HOSPITAL_COMMUNITY)
Admission: EM | Admit: 2014-11-28 | Discharge: 2014-12-02 | DRG: 871 | Disposition: A | Discharge status: Skilled Nursing Facility | Payer: Medicare Other | Attending: Internal Medicine | Admitting: Internal Medicine

## 2014-11-28 ENCOUNTER — Encounter (HOSPITAL_COMMUNITY): Payer: Self-pay | Admitting: Emergency Medicine

## 2014-11-28 DIAGNOSIS — I10 Essential (primary) hypertension: Secondary | ICD-10-CM | POA: Diagnosis present

## 2014-11-28 DIAGNOSIS — Z993 Dependence on wheelchair: Secondary | ICD-10-CM

## 2014-11-28 DIAGNOSIS — Y92003 Bedroom of unspecified non-institutional (private) residence as the place of occurrence of the external cause: Secondary | ICD-10-CM

## 2014-11-28 DIAGNOSIS — Z66 Do not resuscitate: Secondary | ICD-10-CM | POA: Diagnosis present

## 2014-11-28 DIAGNOSIS — Z833 Family history of diabetes mellitus: Secondary | ICD-10-CM

## 2014-11-28 DIAGNOSIS — R911 Solitary pulmonary nodule: Secondary | ICD-10-CM | POA: Diagnosis present

## 2014-11-28 DIAGNOSIS — A419 Sepsis, unspecified organism: Secondary | ICD-10-CM | POA: Diagnosis not present

## 2014-11-28 DIAGNOSIS — Z79899 Other long term (current) drug therapy: Secondary | ICD-10-CM

## 2014-11-28 DIAGNOSIS — Z880 Allergy status to penicillin: Secondary | ICD-10-CM

## 2014-11-28 DIAGNOSIS — Z8249 Family history of ischemic heart disease and other diseases of the circulatory system: Secondary | ICD-10-CM

## 2014-11-28 DIAGNOSIS — M1990 Unspecified osteoarthritis, unspecified site: Secondary | ICD-10-CM | POA: Diagnosis present

## 2014-11-28 DIAGNOSIS — N3000 Acute cystitis without hematuria: Secondary | ICD-10-CM

## 2014-11-28 DIAGNOSIS — R0789 Other chest pain: Secondary | ICD-10-CM | POA: Diagnosis present

## 2014-11-28 DIAGNOSIS — E785 Hyperlipidemia, unspecified: Secondary | ICD-10-CM | POA: Diagnosis present

## 2014-11-28 DIAGNOSIS — M109 Gout, unspecified: Secondary | ICD-10-CM | POA: Diagnosis present

## 2014-11-28 DIAGNOSIS — Z87891 Personal history of nicotine dependence: Secondary | ICD-10-CM

## 2014-11-28 DIAGNOSIS — E119 Type 2 diabetes mellitus without complications: Secondary | ICD-10-CM | POA: Diagnosis present

## 2014-11-28 DIAGNOSIS — R296 Repeated falls: Secondary | ICD-10-CM | POA: Diagnosis present

## 2014-11-28 DIAGNOSIS — R4182 Altered mental status, unspecified: Secondary | ICD-10-CM | POA: Diagnosis not present

## 2014-11-28 DIAGNOSIS — F329 Major depressive disorder, single episode, unspecified: Secondary | ICD-10-CM | POA: Diagnosis present

## 2014-11-28 DIAGNOSIS — W19XXXA Unspecified fall, initial encounter: Secondary | ICD-10-CM | POA: Diagnosis present

## 2014-11-28 DIAGNOSIS — R627 Adult failure to thrive: Secondary | ICD-10-CM | POA: Diagnosis present

## 2014-11-28 DIAGNOSIS — R410 Disorientation, unspecified: Secondary | ICD-10-CM | POA: Diagnosis present

## 2014-11-28 DIAGNOSIS — E872 Acidosis: Secondary | ICD-10-CM | POA: Diagnosis present

## 2014-11-28 DIAGNOSIS — Z9181 History of falling: Secondary | ICD-10-CM

## 2014-11-28 DIAGNOSIS — B9620 Unspecified Escherichia coli [E. coli] as the cause of diseases classified elsewhere: Secondary | ICD-10-CM | POA: Diagnosis present

## 2014-11-28 DIAGNOSIS — G9340 Encephalopathy, unspecified: Secondary | ICD-10-CM | POA: Diagnosis present

## 2014-11-28 DIAGNOSIS — N390 Urinary tract infection, site not specified: Secondary | ICD-10-CM | POA: Diagnosis present

## 2014-11-28 DIAGNOSIS — W06XXXA Fall from bed, initial encounter: Secondary | ICD-10-CM | POA: Diagnosis present

## 2014-11-28 LAB — CBC WITH DIFFERENTIAL/PLATELET
Basophils Absolute: 0 10*3/uL (ref 0.0–0.1)
Basophils Relative: 0 % (ref 0–1)
EOS ABS: 0 10*3/uL (ref 0.0–0.7)
EOS PCT: 0 % (ref 0–5)
HCT: 42.1 % (ref 36.0–46.0)
Hemoglobin: 13.7 g/dL (ref 12.0–15.0)
LYMPHS ABS: 1.1 10*3/uL (ref 0.7–4.0)
Lymphocytes Relative: 9 % — ABNORMAL LOW (ref 12–46)
MCH: 31.6 pg (ref 26.0–34.0)
MCHC: 32.5 g/dL (ref 30.0–36.0)
MCV: 97.2 fL (ref 78.0–100.0)
MONO ABS: 1 10*3/uL (ref 0.1–1.0)
Monocytes Relative: 8 % (ref 3–12)
Neutro Abs: 10 10*3/uL — ABNORMAL HIGH (ref 1.7–7.7)
Neutrophils Relative %: 83 % — ABNORMAL HIGH (ref 43–77)
PLATELETS: 298 10*3/uL (ref 150–400)
RBC: 4.33 MIL/uL (ref 3.87–5.11)
RDW: 14.4 % (ref 11.5–15.5)
WBC: 12.1 10*3/uL — AB (ref 4.0–10.5)

## 2014-11-28 NOTE — ED Notes (Signed)
Pt to CT

## 2014-11-28 NOTE — ED Notes (Signed)
Pt BIB EMS. Pt is from home. Family states that pt was "talking out of her head" today. Pt a/o x 3 per EMS. Pt has strong urine odor. Pt is weaker than normal today per family. Pt has no acute distress. Skin warm, dry.

## 2014-11-28 NOTE — ED Provider Notes (Signed)
CSN: [id number]   Arrival date & time 11/28/14 2147  History  This chart was scribed for Lynn Baton, MD by [name], ED Scribe. This patient was seen in room WA09/WA09 and the patient's care was started at 11:14 PM.  Chief Complaint  Patient presents with  . Urinary Tract Infection    HPI The history is provided by the patient and a relative. No language interpreter was used.   Brought in by EMS from home, [name] is a 74 y.o. female with PMHx of HTN, DM, and HLD who presents to the Emergency Department with her son complaining of multiple falls over the last 3 days. Pt states that "I haven't been myself" and that she has been falling from bed at night. She has been less mobile stating that "all I do is stay in my room".  Associated symptoms include dizziness, right frontal headache, and left ankle pain. Her son states that she started falling 6 days ago instead of 3. He states that ordinarily she uses a cane and a wheelchair for mobility. She is normally able to transfer by herself. Over the last several days she has been newly confused, incontinent of urine, and appearing weak. Her son is concerned that she is no longer able to care for herself. Pt denies fever, chills, chest pain, SOB, vomiting, and diarrhea.   Past Medical History  Diagnosis Date  . Hypertension   . Diabetes mellitus   . Obesity   . Hyperlipemia   . Osteoarthritis   . Gout   . Depression   . Hemorrhoid   . Iron deficiency anemia secondary to blood loss (chronic)     History reviewed. No pertinent past surgical history.  No family history on file.  Social History  Substance Use Topics  . Smoking status: None  . Smokeless tobacco: None  . Alcohol Use: None     Review of Systems  Constitutional: Positive for fatigue. Negative for fever.  Respiratory: Negative for chest tightness and shortness of breath.   Cardiovascular: Negative for chest pain.  Gastrointestinal: Negative for nausea,  vomiting and abdominal pain.  Genitourinary: Negative for dysuria.  Musculoskeletal: Negative for back pain.  Neurological: Positive for dizziness, light-headedness and headaches. Negative for weakness.  Psychiatric/Behavioral: Positive for confusion. Negative for hallucinations.  All other systems reviewed and are negative.  Home Medications   Prior to Admission medications   Medication Sig Start Date End Date Taking? Authorizing Provider  acetaminophen (TYLENOL EX ST ARTHRITIS PAIN) 500 MG tablet Take 500-1,000 mg by mouth 2 (two) times daily as needed for mild pain, moderate pain, fever or headache.    Yes Historical Provider, MD  valsartan (DIOVAN) 80 MG tablet Take 80 mg by mouth 2 (two) times daily.    Yes Historical Provider, MD    Allergies  Aspirin and Penicillins  Triage Vitals: BP 133/77 mmHg  Pulse 109  Temp(Src) 97.7 F (36.5 C) (Oral)  Resp 16  SpO2 93%  Physical Exam  Constitutional: She is oriented to person, place, and time. No distress.  Elderly, obese  HENT:  Head: Normocephalic and atraumatic.  Eyes: Pupils are equal, round, and reactive to light.  Cardiovascular: Normal rate, regular rhythm and normal heart sounds.   No murmur heard. Pulmonary/Chest: Effort normal and breath sounds normal. No respiratory distress. She has no wheezes.  Abdominal: Soft. Bowel sounds are normal. There is no tenderness. There is no rebound.  Musculoskeletal: She exhibits no edema.  Neurological: She is  alert and oriented to person, place, and time.  Cranial nerves II through XII intact, 5 out of 5 strength in all 4 extremity as, no dysmetria to finger-nose-finger  Skin: Skin is warm and dry.  Psychiatric: She has a normal mood and affect.  Nursing note and vitals reviewed.   ED Course  Procedures   DIAGNOSTIC STUDIES: Oxygen Saturation is 93% on RA, adequate by my interpretation.    COORDINATION OF CARE: 11:26 PM Discussed treatment plan which includes lab work with  the pt and her son at bedside and they agreed to the plan.  Labs Reviewed  CBC WITH DIFFERENTIAL/PLATELET - Abnormal; Notable for the following:    WBC 12.1 (*)    Neutrophils Relative % 83 (*)    Neutro Abs 10.0 (*)    Lymphocytes Relative 9 (*)    All other components within normal limits  COMPREHENSIVE METABOLIC PANEL - Abnormal; Notable for the following:    Glucose, Bld 156 (*)    BUN 27 (*)    ALT 11 (*)    All other components within normal limits  URINALYSIS, ROUTINE W REFLEX MICROSCOPIC (NOT AT [facility]) - Abnormal; Notable for the following:    Color, Urine AMBER (*)    APPearance CLOUDY (*)    Hgb urine dipstick TRACE (*)    Protein, ur 100 (*)    Nitrite POSITIVE (*)    Leukocytes, UA SMALL (*)    All other components within normal limits  URINE MICROSCOPIC-ADD ON - Abnormal; Notable for the following:    Bacteria, UA MANY (*)    All other components within normal limits  I-STAT CG4 LACTIC ACID, ED - Abnormal; Notable for the following:    Lactic Acid, Venous 2.12 (*)    All other components within normal limits  URINE CULTURE  CULTURE, BLOOD (ROUTINE X 2)  CULTURE, BLOOD (ROUTINE X 2)    Imaging Review Dg Ankle Complete Left  11/29/2014   CLINICAL DATA:  74 year old female with fall and ankle pain.  EXAM: LEFT ANKLE COMPLETE - 3+ VIEW  COMPARISON:  None.  FINDINGS: No acute fracture or dislocation. The ankle mortise is intact. There is osteopenia with degenerative changes. The soft tissues are unremarkable. No radiopaque foreign object. A 7 mm calcaneal spur.  IMPRESSION: No acute fracture or dislocation.   Electronically Signed   By: Elgie Collard M.D.   On: 11/29/2014 02:08   Ct Head Wo Contrast  11/29/2014   CLINICAL DATA:  Weakness, confusion, and recent falls.  EXAM: CT HEAD WITHOUT CONTRAST  TECHNIQUE: Contiguous axial images were obtained from the base of the skull through the vertex without intravenous contrast.  COMPARISON:  None.  FINDINGS: Mild diffuse  cerebral atrophy. Patchy low-attenuation changes in the deep white matter consistent with small vessel ischemia. No mass effect or midline shift. No abnormal extra-axial fluid collections. Gray-white matter junctions are distinct. Basal cisterns are not effaced. No evidence of acute intracranial hemorrhage. No depressed skull fractures. Visualized paranasal sinuses and mastoid air cells are not opacified. Vascular calcifications.  IMPRESSION: No acute intracranial abnormalities. Chronic atrophy and small vessel ischemic changes.   Electronically Signed   By: Burman Nieves M.D.   On: 11/29/2014 00:01    EKG Interpretation  Date/Time:  Saturday November 29 2014 00:15:42 EDT Ventricular Rate:  93 PR Interval:  137 QRS Duration: 82 QT Interval:  351 QTC Calculation: 436 R Axis:   14 Text Interpretation:  Sinus rhythm Right atrial enlargement Confirmed by  HORTON  MD, Toni Amend ([id number]) on 11/29/2014 12:56:33 AM    MDM   Final diagnoses:  Acute cystitis without hematuria  Failure to thrive in adult   Patient presents with confusion, difficulty doing ADLs and general decline over the last several days. Afebrile. She presents with her son. She is technically alert and oriented 3.  Workup notable for leukocytosis to 12.1 mild lactic acidosis to 2.12. Nitrite-positive urine with many bacteria and 3-6 white cells. Urine and blood cultures place. Patient was given fluids and Rocephin.  She may be early urosepsis given leukocytosis.  Given age and inability to perform her ADLs in the setting of an acute urinary tract infection and possible early sepsis, will admit for further antibiotics.  I personally performed the services described in this documentation, which was scribed in my presence. The recorded information has been reviewed and is accurate.   Lynn Baton, MD 11/29/14 [phone]

## 2014-11-29 ENCOUNTER — Encounter (HOSPITAL_COMMUNITY): Payer: Self-pay

## 2014-11-29 ENCOUNTER — Emergency Department (HOSPITAL_COMMUNITY): Payer: Medicare Other

## 2014-11-29 DIAGNOSIS — R410 Disorientation, unspecified: Secondary | ICD-10-CM

## 2014-11-29 DIAGNOSIS — Z79899 Other long term (current) drug therapy: Secondary | ICD-10-CM | POA: Diagnosis not present

## 2014-11-29 DIAGNOSIS — Y92003 Bedroom of unspecified non-institutional (private) residence as the place of occurrence of the external cause: Secondary | ICD-10-CM | POA: Diagnosis not present

## 2014-11-29 DIAGNOSIS — Z66 Do not resuscitate: Secondary | ICD-10-CM | POA: Diagnosis present

## 2014-11-29 DIAGNOSIS — R296 Repeated falls: Secondary | ICD-10-CM | POA: Diagnosis present

## 2014-11-29 DIAGNOSIS — Z8249 Family history of ischemic heart disease and other diseases of the circulatory system: Secondary | ICD-10-CM | POA: Diagnosis not present

## 2014-11-29 DIAGNOSIS — N390 Urinary tract infection, site not specified: Secondary | ICD-10-CM | POA: Diagnosis not present

## 2014-11-29 DIAGNOSIS — W19XXXA Unspecified fall, initial encounter: Secondary | ICD-10-CM

## 2014-11-29 DIAGNOSIS — Z87891 Personal history of nicotine dependence: Secondary | ICD-10-CM | POA: Diagnosis not present

## 2014-11-29 DIAGNOSIS — E119 Type 2 diabetes mellitus without complications: Secondary | ICD-10-CM

## 2014-11-29 DIAGNOSIS — B9620 Unspecified Escherichia coli [E. coli] as the cause of diseases classified elsewhere: Secondary | ICD-10-CM | POA: Diagnosis present

## 2014-11-29 DIAGNOSIS — G9340 Encephalopathy, unspecified: Secondary | ICD-10-CM | POA: Diagnosis not present

## 2014-11-29 DIAGNOSIS — A419 Sepsis, unspecified organism: Secondary | ICD-10-CM | POA: Diagnosis not present

## 2014-11-29 DIAGNOSIS — E785 Hyperlipidemia, unspecified: Secondary | ICD-10-CM | POA: Diagnosis present

## 2014-11-29 DIAGNOSIS — M109 Gout, unspecified: Secondary | ICD-10-CM | POA: Diagnosis present

## 2014-11-29 DIAGNOSIS — M1990 Unspecified osteoarthritis, unspecified site: Secondary | ICD-10-CM | POA: Diagnosis present

## 2014-11-29 DIAGNOSIS — Z993 Dependence on wheelchair: Secondary | ICD-10-CM | POA: Diagnosis not present

## 2014-11-29 DIAGNOSIS — E872 Acidosis: Secondary | ICD-10-CM | POA: Diagnosis present

## 2014-11-29 DIAGNOSIS — Z880 Allergy status to penicillin: Secondary | ICD-10-CM | POA: Diagnosis not present

## 2014-11-29 DIAGNOSIS — Z833 Family history of diabetes mellitus: Secondary | ICD-10-CM | POA: Diagnosis not present

## 2014-11-29 DIAGNOSIS — F329 Major depressive disorder, single episode, unspecified: Secondary | ICD-10-CM | POA: Diagnosis present

## 2014-11-29 DIAGNOSIS — Z9181 History of falling: Secondary | ICD-10-CM | POA: Diagnosis not present

## 2014-11-29 DIAGNOSIS — I10 Essential (primary) hypertension: Secondary | ICD-10-CM | POA: Diagnosis not present

## 2014-11-29 DIAGNOSIS — W06XXXA Fall from bed, initial encounter: Secondary | ICD-10-CM | POA: Diagnosis present

## 2014-11-29 DIAGNOSIS — R911 Solitary pulmonary nodule: Secondary | ICD-10-CM | POA: Diagnosis present

## 2014-11-29 DIAGNOSIS — R0789 Other chest pain: Secondary | ICD-10-CM | POA: Diagnosis present

## 2014-11-29 DIAGNOSIS — R627 Adult failure to thrive: Secondary | ICD-10-CM | POA: Diagnosis present

## 2014-11-29 DIAGNOSIS — R4182 Altered mental status, unspecified: Secondary | ICD-10-CM | POA: Diagnosis present

## 2014-11-29 HISTORY — DX: Type 2 diabetes mellitus without complications: E11.9

## 2014-11-29 HISTORY — DX: Urinary tract infection, site not specified: N39.0

## 2014-11-29 HISTORY — DX: Disorientation, unspecified: R41.0

## 2014-11-29 LAB — URINE MICROSCOPIC-ADD ON

## 2014-11-29 LAB — BASIC METABOLIC PANEL
ANION GAP: 9 (ref 5–15)
BUN: 25 mg/dL — ABNORMAL HIGH (ref 6–20)
CALCIUM: 9.7 mg/dL (ref 8.9–10.3)
CO2: 28 mmol/L (ref 22–32)
Chloride: 104 mmol/L (ref 101–111)
Creatinine, Ser: 0.79 mg/dL (ref 0.44–1.00)
GLUCOSE: 140 mg/dL — AB (ref 65–99)
Potassium: 3.5 mmol/L (ref 3.5–5.1)
SODIUM: 141 mmol/L (ref 135–145)

## 2014-11-29 LAB — URINALYSIS, ROUTINE W REFLEX MICROSCOPIC
BILIRUBIN URINE: NEGATIVE
Glucose, UA: NEGATIVE mg/dL
Ketones, ur: NEGATIVE mg/dL
NITRITE: POSITIVE — AB
Protein, ur: 100 mg/dL — AB
SPECIFIC GRAVITY, URINE: 1.023 (ref 1.005–1.030)
UROBILINOGEN UA: 1 mg/dL (ref 0.0–1.0)
pH: 5.5 (ref 5.0–8.0)

## 2014-11-29 LAB — CBC
HCT: 40 % (ref 36.0–46.0)
HEMOGLOBIN: 12.7 g/dL (ref 12.0–15.0)
MCH: 31.1 pg (ref 26.0–34.0)
MCHC: 31.8 g/dL (ref 30.0–36.0)
MCV: 97.8 fL (ref 78.0–100.0)
Platelets: 296 10*3/uL (ref 150–400)
RBC: 4.09 MIL/uL (ref 3.87–5.11)
RDW: 14.5 % (ref 11.5–15.5)
WBC: 9.8 10*3/uL (ref 4.0–10.5)

## 2014-11-29 LAB — COMPREHENSIVE METABOLIC PANEL
ALT: 11 U/L — ABNORMAL LOW (ref 14–54)
ANION GAP: 10 (ref 5–15)
AST: 17 U/L (ref 15–41)
Albumin: 3.7 g/dL (ref 3.5–5.0)
Alkaline Phosphatase: 70 U/L (ref 38–126)
BUN: 27 mg/dL — ABNORMAL HIGH (ref 6–20)
CHLORIDE: 105 mmol/L (ref 101–111)
CO2: 26 mmol/L (ref 22–32)
Calcium: 10.3 mg/dL (ref 8.9–10.3)
Creatinine, Ser: 0.86 mg/dL (ref 0.44–1.00)
GFR calc non Af Amer: >60 mL/min (ref >60)
Glucose, Bld: 156 mg/dL — ABNORMAL HIGH (ref 65–99)
POTASSIUM: 3.6 mmol/L (ref 3.5–5.1)
SODIUM: 141 mmol/L (ref 135–145)
Total Bilirubin: 1.1 mg/dL (ref 0.3–1.2)
Total Protein: 7.5 g/dL (ref 6.5–8.1)

## 2014-11-29 LAB — GLUCOSE, CAPILLARY
GLUCOSE-CAPILLARY: 114 mg/dL — AB (ref 65–99)
GLUCOSE-CAPILLARY: 137 mg/dL — AB (ref 65–99)

## 2014-11-29 LAB — LACTIC ACID, PLASMA
Lactic Acid, Venous: 1.1 mmol/L (ref 0.5–2.0)
Lactic Acid, Venous: 0.9 mmol/L (ref 0.5–2.0)

## 2014-11-29 LAB — LIPID PANEL
CHOL/HDL RATIO: 3.5 ratio
Cholesterol: 218 mg/dL — ABNORMAL HIGH (ref 0–200)
HDL: 62 mg/dL (ref >40)
LDL Cholesterol: 140 mg/dL — ABNORMAL HIGH (ref 0–99)
Triglycerides: 80 mg/dL (ref <150)
VLDL: 16 mg/dL (ref 0–40)

## 2014-11-29 LAB — PROTIME-INR
INR: 1 (ref 0.00–1.49)
Prothrombin Time: 13.4 seconds (ref 11.6–15.2)

## 2014-11-29 LAB — PROCALCITONIN: Procalcitonin: 0.33 ng/mL

## 2014-11-29 LAB — APTT: APTT: 29 s (ref 24–37)

## 2014-11-29 LAB — I-STAT CG4 LACTIC ACID, ED: LACTIC ACID, VENOUS: 2.12 mmol/L — AB (ref 0.5–2.0)

## 2014-11-29 MED ORDER — DEXTROSE 5 % IV SOLN
1.0000 g | Freq: Once | INTRAVENOUS | Status: AC
  Administered 2014-11-29: 1 g via INTRAVENOUS
  Filled 2014-11-29: qty 1

## 2014-11-29 MED ORDER — DEXTROSE 5 % IV SOLN
2.0000 g | Freq: Once | INTRAVENOUS | Status: DC
  Administered 2014-11-29: 2 g via INTRAVENOUS
  Filled 2014-11-29: qty 2

## 2014-11-29 MED ORDER — SODIUM CHLORIDE 0.9 % IV BOLUS (SEPSIS)
1000.0000 mL | Freq: Once | INTRAVENOUS | Status: AC
  Administered 2014-11-29: 1000 mL via INTRAVENOUS

## 2014-11-29 MED ORDER — HEPARIN SODIUM (PORCINE) 5000 UNIT/ML IJ SOLN
5000.0000 [IU] | Freq: Three times a day (TID) | INTRAMUSCULAR | Status: DC
  Administered 2014-11-29 – 2014-12-02 (×8): 5000 [IU] via SUBCUTANEOUS
  Filled 2014-11-29 (×11): qty 1

## 2014-11-29 MED ORDER — DEXTROSE 5 % IV SOLN
1.0000 g | Freq: Three times a day (TID) | INTRAVENOUS | Status: DC
  Administered 2014-11-29 – 2014-12-02 (×9): 1 g via INTRAVENOUS
  Filled 2014-11-29 (×11): qty 1

## 2014-11-29 MED ORDER — ACETAMINOPHEN 650 MG RE SUPP: 650.0000 mg | Freq: Four times a day (QID) | RECTAL | Status: DC | PRN

## 2014-11-29 MED ORDER — ACETAMINOPHEN 325 MG PO TABS: 650.0000 mg | ORAL_TABLET | Freq: Four times a day (QID) | ORAL | Status: DC | PRN

## 2014-11-29 MED ORDER — CETYLPYRIDINIUM CHLORIDE 0.05 % MT LIQD
7.0000 mL | Freq: Two times a day (BID) | OROMUCOSAL | Status: DC
  Administered 2014-11-29 (×2): 7 mL via OROMUCOSAL

## 2014-11-29 MED ORDER — OXYCODONE-ACETAMINOPHEN 5-325 MG PO TABS: 1.0000 | ORAL_TABLET | Freq: Four times a day (QID) | ORAL | Status: DC | PRN

## 2014-11-29 MED ORDER — IRBESARTAN 75 MG PO TABS
75.0000 mg | ORAL_TABLET | Freq: Every day | ORAL | Status: DC
  Administered 2014-11-29 – 2014-12-02 (×4): 75 mg via ORAL
  Filled 2014-11-29 (×4): qty 1

## 2014-11-29 MED ORDER — SODIUM CHLORIDE 0.9 % IV SOLN
INTRAVENOUS | Status: DC
  Administered 2014-11-29: 05:00:00 via INTRAVENOUS

## 2014-11-29 MED ORDER — CHLORHEXIDINE GLUCONATE 0.12 % MT SOLN
15.0000 mL | Freq: Two times a day (BID) | OROMUCOSAL | Status: DC
  Administered 2014-11-29 – 2014-12-02 (×7): 15 mL via OROMUCOSAL
  Filled 2014-11-29 (×7): qty 15

## 2014-11-29 MED ORDER — SODIUM CHLORIDE 0.9 % IJ SOLN
3.0000 mL | Freq: Two times a day (BID) | INTRAMUSCULAR | Status: DC
  Administered 2014-11-29 – 2014-12-02 (×8): 3 mL via INTRAVENOUS

## 2014-11-29 MED ORDER — ONDANSETRON HCL 4 MG PO TABS: 4.0000 mg | ORAL_TABLET | Freq: Four times a day (QID) | ORAL | Status: DC | PRN

## 2014-11-29 MED ORDER — ONDANSETRON HCL 4 MG/2ML IJ SOLN: 4.0000 mg | Freq: Four times a day (QID) | INTRAMUSCULAR | Status: DC | PRN

## 2014-11-29 MED ORDER — HYDRALAZINE HCL 20 MG/ML IJ SOLN
5.0000 mg | INTRAMUSCULAR | Status: DC | PRN
  Administered 2014-12-01 – 2014-12-02 (×3): 5 mg via INTRAVENOUS
  Filled 2014-11-29 (×5): qty 1

## 2014-11-29 NOTE — Progress Notes (Signed)
PROGRESS NOTE    [name]:[id number] DOB: [birth year] DOA: 11/28/2014 PCP: No primary care provider on file.  HPI/Brief narrative 74 year old female patient with history of HTN, HLD, diet-controlled diabetes, gout, depression, urinary incontinence, admitted to [facility] on 11/28/14 with complaints of altered mental status and fall. As per report, confusion and multiple falls over the last 6 days PTA. Normally able to transfer by herself and uses a cane and a wheelchair for mobility. Dizziness +. In ED, patient was found to have negative CT-head for acute abnormalities, positive urinalysis for UTI, WBC 12.1, temperature 99.4, lactate of 2.12, tachycardia, electrolytes okay. X-ray of left ankle is negative for any fracture. The patient is admitted to inpatient for further evaluated and treatment.   Assessment/Plan:  UTI - Started empirically on IV aztreonam pending urine culture results-continue - Mild sepsis present on admission (heart rate in the 110s, temperature 99.4, WBC 12.1 & elevated lactate) - Sepsis physiology has resolved. Lactate normalized. Pro-calcitonin low.  Acute encephalopathy - Likely secondary to UTI sepsis - CT head negative - Treat underlying cause. Improving.  Recurrent falls - Likely due to UTI complicating advanced age. No focal neurological deficits. PT and OT evaluation  Essential hypertension - Mildly uncontrolled. Continue irbesartan. Added when necessary IV hydralazine  Hyperlipidemia - Not on statins at home  Diet-controlled DM 2  Left lower chest wall pain - No fractures reported on chest x-ray  RUL pulmonary nodule 6 mm - Reported on portable chest x-ray. Obtain 2 view chest x-ray. May need further evaluation by CT-can be done as outpatient.      DVT prophylaxis: Subcutaneous heparin Code Status: Full Family Communication: None at bedside Disposition Plan: DC home when medically  stable   Consultants:  None  Procedures:  None  Antibiotics:  IV aztreonam 9/9 >  IV Rocephin 1 dose on 9/9   Subjective: Slightly confused. Wants something to eat. Denies pain. As per nursing, no acute events and appears coherent.  Objective: Filed Vitals:   11/29/14 0248 11/29/14 0300 11/29/14 0330 11/29/14 0447  BP: 175/85 193/96  163/86  Pulse: 88 93    Temp:  97.7 F (36.5 C)    TempSrc:  Oral    Resp: 22 20    Height:   5' (1.524 m)   Weight:   67.1 kg (147 lb 14.9 oz)   SpO2: 94% 98%     No intake or output data in the 24 hours ending 11/29/14 1345 Filed Weights   11/29/14 0330  Weight: 67.1 kg (147 lb 14.9 oz)     Exam:  General exam: Moderately built and frail elderly female lying comfortably propped up in bed Respiratory system: Clear. No increased work of breathing. Cardiovascular system: S1 & S2 heard, RRR. No JVD, murmurs, gallops, clicks or pedal edema. Telemetry: Sinus rhythm with occasional PVCs. Gastrointestinal system: Abdomen is nondistended, soft and nontender. Normal bowel sounds heard. Central nervous system: Alert and oriented to self and place. No focal neurological deficits. Extremities: Symmetric 5 x 5 power.   Data Reviewed: Basic Metabolic Panel:  Recent Labs Lab 11/28/14 2342 11/29/14 0340  NA 141 141  K 3.6 3.5  CL 105 104  CO2 26 28  GLUCOSE 156* 140*  BUN 27* 25*  CREATININE 0.86 0.79  CALCIUM 10.3 9.7   Liver Function Tests:  Recent Labs Lab 11/28/14 2342  AST 17  ALT 11*  ALKPHOS 70  BILITOT 1.1  PROT 7.5  ALBUMIN 3.7  No results for input(s): LIPASE, AMYLASE in the last 168 hours. No results for input(s): AMMONIA in the last 168 hours. CBC:  Recent Labs Lab 11/28/14 2342 12/19/14 0340  WBC 12.1* 9.8  NEUTROABS 10.0*  --   HGB 13.7 12.7  HCT 42.1 40.0  MCV 97.2 97.8  PLT 298 296   Cardiac Enzymes: No results for input(s): CKTOTAL, CKMB, CKMBINDEX, TROPONINI in the last 168 hours. BNP  (last 3 results) No results for input(s): PROBNP in the last 8760 hours. CBG:  Recent Labs Lab 19-Dec-2014 0729 12-19-2014 1102  GLUCAP 114* 137*    No results found for this or any previous visit (from the past 240 hour(s)).         Studies: Dg Ankle Complete Left  19-Dec-2014   CLINICAL DATA:  74 year old female with fall and ankle pain.  EXAM: LEFT ANKLE COMPLETE - 3+ VIEW  COMPARISON:  None.  FINDINGS: No acute fracture or dislocation. The ankle mortise is intact. There is osteopenia with degenerative changes. The soft tissues are unremarkable. No radiopaque foreign object. A 7 mm calcaneal spur.  IMPRESSION: No acute fracture or dislocation.   Electronically Signed   By: Elgie Collard M.D.   On: December 19, 2014 02:08   Ct Head Wo Contrast  19-Dec-2014   CLINICAL DATA:  Weakness, confusion, and recent falls.  EXAM: CT HEAD WITHOUT CONTRAST  TECHNIQUE: Contiguous axial images were obtained from the base of the skull through the vertex without intravenous contrast.  COMPARISON:  None.  FINDINGS: Mild diffuse cerebral atrophy. Patchy low-attenuation changes in the deep white matter consistent with small vessel ischemia. No mass effect or midline shift. No abnormal extra-axial fluid collections. Gray-white matter junctions are distinct. Basal cisterns are not effaced. No evidence of acute intracranial hemorrhage. No depressed skull fractures. Visualized paranasal sinuses and mastoid air cells are not opacified. Vascular calcifications.  IMPRESSION: No acute intracranial abnormalities. Chronic atrophy and small vessel ischemic changes.   Electronically Signed   By: Burman Nieves M.D.   On: 12-19-14 00:01   Dg Chest Portable 1 View  2014/12/19   CLINICAL DATA:  74 year old female with leukocytosis  EXAM: PORTABLE CHEST - 1 VIEW  COMPARISON:  None.  FINDINGS: Single-view of the chest demonstrate mild emphysematous changes of the lungs. There is no focal consolidation, pleural effusion, or  pneumothorax. A 6 mm nodular density noted in the right upper lung field projecting over the right anterior third rib. It is indeterminate whether this lesion is on the skin or in the lung on this single view study. PA and lateral projections may provide better evaluation. Correlation with prior studies if available recommended to evaluate for interval change. If prior studies are not available CT is recommended for better evaluation of the lungs.  The aorta is tortuous. Mild cardiomegaly. No pleural effusion or pneumothorax. The osseous structures are grossly unremarkable.  IMPRESSION: A 6 mm nodule in the right upper lung field as described. Comparison with prior studies if available or further evaluation with CT is recommended.   Electronically Signed   By: Elgie Collard M.D.   On: 12/19/2014 03:42        Scheduled Meds: . antiseptic oral rinse  7 mL Mouth Rinse q12n4p  . aztreonam  1 g Intravenous Q8H  . chlorhexidine  15 mL Mouth Rinse BID  . heparin  5,000 Units Subcutaneous 3 times per day  . irbesartan  75 mg Oral Daily  . sodium chloride  3 mL Intravenous Q12H  Continuous Infusions:    Principal Problem:   UTI (lower urinary tract infection) Active Problems:   Hypertension   Hyperlipemia   Diabetes mellitus without complication   Sepsis   Acute encephalopathy   Essential hypertension   Fall    Time spent: 30 minutes    Kengo Sturges, MD, FACP, FHM. [facility] Pager [phone]  If 7PM-7AM, please contact night-coverage [email or web address] Password [email or web address] 11/29/2014, 1:45 PM    LOS: 0 days

## 2014-11-29 NOTE — Evaluation (Signed)
Physical Therapy Evaluation Patient Details Name: [name] MRN: [id number] DOB: 1940-04-23 Today's Date: 11/29/2014   History of Present Illness  74 year old female patient with history of HTN, HLD, diet-controlled diabetes, gout, depression, urinary incontinence, admitted to [facility] on 11/28/14 with complaints of altered mental status and fall. As per report, confusion and multiple falls over the last 6 days PTA.  Clinical Impression  Pt admitted with above diagnosis. Pt currently with functional limitations due to the deficits listed below (see PT Problem List).  Pt will benefit from skilled PT to increase their independence and safety with mobility to allow discharge to the venue listed below.  Pt has a lot of anxiety with any movement.  Pt fearful even with rolling in the bed with all rails up.  Attempted sitting EOB, and pt too fearful to try and became resistive.  Recommend SNF.     Follow Up Recommendations SNF    Equipment Recommendations  None recommended by PT    Recommendations for Other Services       Precautions / Restrictions Precautions Precautions: Fall Precaution Comments: 4 falls in 3 days before hospitalization Restrictions Weight Bearing Restrictions: No      Mobility  Bed Mobility Overal bed mobility: Needs Assistance Bed Mobility: Rolling Rolling: Mod assist         General bed mobility comments: Pt VERY fearful of falling with rolling.  Attempted to sit EOB and pt unable to complete due to FEAR of falling.  Transfers                    Ambulation/Gait                Stairs            Wheelchair Mobility    Modified Rankin (Stroke Patients Only)       Balance                                             Pertinent Vitals/Pain Pain Assessment: No/denies pain    Home Living Family/patient expects to be discharged to:: Private residence Living Arrangements: Children (daughter is  unable to A pt with mobility) Available Help at Discharge: Available PRN/intermittently Type of Home: House Home Access: Ramped entrance     Home Layout: One level Home Equipment: Wheelchair - manual;Cane - single point;Bedside commode;Walker - 2 wheels Additional Comments: Pt propels w/c with 2 canes.    Prior Function Level of Independence: Needs assistance   Gait / Transfers Assistance Needed: Pt had been ambulating from bathroom door to toilet until recent fall and uses w/c the rest of the time.  Transfers herself I'ly.  Pt reports he daughter is unable to A her.           Hand Dominance        Extremity/Trunk Assessment   Upper Extremity Assessment: Generalized weakness           Lower Extremity Assessment: Generalized weakness         Communication   Communication: No difficulties  Cognition Arousal/Alertness: Awake/alert Behavior During Therapy: Anxious Overall Cognitive Status: Within Functional Limits for tasks assessed                      General Comments General comments (skin integrity, edema, etc.): Unable to sit EOB due to fear  of falling.    Exercises        Assessment/Plan    PT Assessment Patient needs continued PT services  PT Diagnosis Generalized weakness   PT Problem List Decreased strength;Decreased activity tolerance;Decreased balance;Decreased mobility  PT Treatment Interventions Functional mobility training;Therapeutic activities;Therapeutic exercise;Balance training   PT Goals (Current goals can be found in the Care Plan section) Acute Rehab PT Goals Patient Stated Goal: To not fall PT Goal Formulation: With patient Time For Goal Achievement: 12/13/14 Potential to Achieve Goals: Good    Frequency Min 2X/week   Barriers to discharge        Co-evaluation               End of Session   Activity Tolerance: Other (comment) Patient left: in bed;with bed alarm set Nurse Communication: Mobility status          Time: 1610-9604 PT Time Calculation (min) (ACUTE ONLY): 23 min   Charges:   PT Evaluation $Initial PT Evaluation Tier I: 1 Procedure PT Treatments $Therapeutic Activity: 8-22 mins   PT G Codes:        Niah Heinle LUBECK 11/29/2014, 5:06 PM

## 2014-11-29 NOTE — H&P (Signed)
[facility] History and Physical  [name] ZOX:[id number] DOB: 09/08/1940 DOA: 11/28/2014  Referring physician: ED physician PCP: No primary care provider on file.  Specialists:   Chief Complaint: Altered mental status, fall  HPI: [name] is a 74 y.o. female with PMH of hypertension, hyperlipidemia, diet-controlled diabetes, gout, depression, osteoarthritis, urinary incontinence, who presents with altered mental status and fall.  Patient has AMS, and is unable to provide accurate medical history, therefore, most of the history is obtained by discussing the case with ED physician and partially from pt. ED physician was able to talk to patient's son. It seems that patient has been confused and had multiple falls over the last 6 days. She has been falling from bed at night. She is normally able to transfer by herself and uses a cane and a wheelchair for mobility. Over the last several days she has been newly confused and appearing weak. She has pain over left ankle and left lower rib cage. She seems to have dizziness. Not sure whether patient has unilateral weakness. No diarrhea, abdominal pain. She has urinary incontinence, not sure whether patient has symptoms of UTI.  In ED, patient was found to have negative CT-head for acute abnormalities, positive urinalysis for UTI, WBC 12.1, temperature 99.4, lactate of 2.12, tachycardia, electrolytes okay. X-ray of left ankle is negative for any fracture. The patient is admitted to inpatient for further evaluated and treatment.  Where does patient live?   At home  Can patient participate in ADLs?  Yes    Review of Systems: Unable to be obtained due to altered mental status  Allergy:  Allergies  Allergen Reactions  . Aspirin Other (See Comments)    Bleeding   . Penicillins Rash    Has patient had a PCN reaction causing immediate rash, facial/tongue/throat swelling, SOB or lightheadedness with hypotension: No Has patient had a PCN  reaction causing severe rash involving mucus membranes or skin necrosis: No Has patient had a PCN reaction that required hospitalization No Has patient had a PCN reaction occurring within the last 10 years: No If all of the above answers are "NO", then may proceed with Cephalosporin use.     Past Medical History  Diagnosis Date  . Hypertension   . Diabetes mellitus   . Obesity   . Hyperlipemia   . Osteoarthritis   . Gout   . Depression   . Hemorrhoid   . Iron deficiency anemia secondary to blood loss (chronic)     Past Surgical History  Procedure Laterality Date  . Knee surgery Right     Social History:  reports that she has quit smoking. She does not have any smokeless tobacco history on file. Her alcohol and drug histories are not on file.  Family History:  Family History  Problem Relation Age of Onset  . Heart attack Mother   . Diabetes Father      Prior to Admission medications   Medication Sig Start Date End Date Taking? Authorizing Provider  acetaminophen (TYLENOL EX ST ARTHRITIS PAIN) 500 MG tablet Take 500-1,000 mg by mouth 2 (two) times daily as needed for mild pain, moderate pain, fever or headache.    Yes Historical Provider, MD  valsartan (DIOVAN) 80 MG tablet Take 80 mg by mouth 2 (two) times daily.    Yes Historical Provider, MD    Physical Exam: Filed Vitals:   11/29/14 0248 11/29/14 0300 11/29/14 0330 11/29/14 0447  BP: 175/85 193/96  163/86  Pulse: 88  93    Temp:  97.7 F (36.5 C)    TempSrc:  Oral    Resp: 22 20    Height:   5' (1.524 m)   Weight:   67.1 kg (147 lb 14.9 oz)   SpO2: 94% 98%     General: Not in acute distress HEENT:       Eyes: PERRL, EOMI, no scleral icterus.       ENT: No discharge from the ears and nose, no pharynx injection, no tonsillar enlargement.  Very poor teeth.       Neck: No JVD, no bruit, no mass felt. Heme: No neck lymph node enlargement. Cardiac: S1/S2, RRR, No murmurs, No gallops or rubs. Pulm:  No rales,  wheezing, rhonchi or rubs. Has tenderness over L lower chest wall.  Abd: Soft, nondistended, nontender, no rebound pain, no organomegaly, BS present. Ext: No pitting leg edema bilaterally. 2+DP/PT pulse bilaterally. Has tenderness over left ankle. Musculoskeletal: No joint deformities, No joint redness or warmth, no limitation of ROM in spin. Skin: No rashes.  Neuro: Alert, confused, oriented to place, not to time and person, cranial nerves II-XII grossly intact, muscle strength 5/5 in all extremities, sensation to light touch intact. Brachial reflex 1+ bilaterally. Knee reflex 1+ bilaterally. Negative Babinski's sign.  Psych: Patient is not psychotic, no suicidal or hemocidal ideation.  Labs on Admission:  Basic Metabolic Panel:  Recent Labs Lab 11/28/14 2342 11/29/14 0340  NA 141 141  K 3.6 3.5  CL 105 104  CO2 26 28  GLUCOSE 156* 140*  BUN 27* 25*  CREATININE 0.86 0.79  CALCIUM 10.3 9.7   Liver Function Tests:  Recent Labs Lab 11/28/14 2342  AST 17  ALT 11*  ALKPHOS 70  BILITOT 1.1  PROT 7.5  ALBUMIN 3.7   No results for input(s): LIPASE, AMYLASE in the last 168 hours. No results for input(s): AMMONIA in the last 168 hours. CBC:  Recent Labs Lab 11/28/14 2342 11/29/14 0340  WBC 12.1* 9.8  NEUTROABS 10.0*  --   HGB 13.7 12.7  HCT 42.1 40.0  MCV 97.2 97.8  PLT 298 296   Cardiac Enzymes: No results for input(s): CKTOTAL, CKMB, CKMBINDEX, TROPONINI in the last 168 hours.  BNP (last 3 results) No results for input(s): BNP in the last 8760 hours.  ProBNP (last 3 results) No results for input(s): PROBNP in the last 8760 hours.  CBG: No results for input(s): GLUCAP in the last 168 hours.  Radiological Exams on Admission: Dg Ankle Complete Left  11/29/2014   CLINICAL DATA:  74 year old female with fall and ankle pain.  EXAM: LEFT ANKLE COMPLETE - 3+ VIEW  COMPARISON:  None.  FINDINGS: No acute fracture or dislocation. The ankle mortise is intact. There is  osteopenia with degenerative changes. The soft tissues are unremarkable. No radiopaque foreign object. A 7 mm calcaneal spur.  IMPRESSION: No acute fracture or dislocation.   Electronically Signed   By: Elgie Collard M.D.   On: 11/29/2014 02:08   Ct Head Wo Contrast  11/29/2014   CLINICAL DATA:  Weakness, confusion, and recent falls.  EXAM: CT HEAD WITHOUT CONTRAST  TECHNIQUE: Contiguous axial images were obtained from the base of the skull through the vertex without intravenous contrast.  COMPARISON:  None.  FINDINGS: Mild diffuse cerebral atrophy. Patchy low-attenuation changes in the deep white matter consistent with small vessel ischemia. No mass effect or midline shift. No abnormal extra-axial fluid collections. Gray-white matter junctions are distinct. Basal  cisterns are not effaced. No evidence of acute intracranial hemorrhage. No depressed skull fractures. Visualized paranasal sinuses and mastoid air cells are not opacified. Vascular calcifications.  IMPRESSION: No acute intracranial abnormalities. Chronic atrophy and small vessel ischemic changes.   Electronically Signed   By: Burman Nieves M.D.   On: 11/29/2014 00:01   Dg Chest Portable 1 View  11/29/2014   CLINICAL DATA:  74 year old female with leukocytosis  EXAM: PORTABLE CHEST - 1 VIEW  COMPARISON:  None.  FINDINGS: Single-view of the chest demonstrate mild emphysematous changes of the lungs. There is no focal consolidation, pleural effusion, or pneumothorax. A 6 mm nodular density noted in the right upper lung field projecting over the right anterior third rib. It is indeterminate whether this lesion is on the skin or in the lung on this single view study. PA and lateral projections may provide better evaluation. Correlation with prior studies if available recommended to evaluate for interval change. If prior studies are not available CT is recommended for better evaluation of the lungs.  The aorta is tortuous. Mild cardiomegaly. No  pleural effusion or pneumothorax. The osseous structures are grossly unremarkable.  IMPRESSION: A 6 mm nodule in the right upper lung field as described. Comparison with prior studies if available or further evaluation with CT is recommended.   Electronically Signed   By: Elgie Collard M.D.   On: 11/29/2014 03:42    EKG: Independently reviewed. No ischemic change  Assessment/Plan Principal Problem:   URI (upper respiratory infection) Active Problems:   Hypertension   Hyperlipemia   Diabetes mellitus without complication   Sepsis   Acute encephalopathy   UTI (lower urinary tract infection)   Essential hypertension   Fall  UTI and sepsis 2/2 UTI: Patient has positive urinalysis with small amount of leukocytes and positive nitrite. She has urinary incontinence, not sure whether patient has symptoms of UTI. Patient is septic with lactate 2.12, leukocytosis and a tachycardia . She is hemodynamically stable.   - Admit to telemetry - Aztreonam IV (ED started Rocephin, was switched to aztreonam since patient is allergic to penicillin). - Follow up results of urine and blood cx and amend antibiotic regimen if needed per sensitivity results - will get Procalcitonin and trend lactic acid levels per sepsis protocol.  - IVF: 2L of NS bolus in ED, followed by 75 cc/h  Acute encephalopathy: Likely due to sepsis and UTI. CT head is negative -TreatUTI and sepsis as above -Frequent neuro checks  Fall: Likely due to UTI, no focal neurologic findings on examination. -pt/ot -Observed closely. When patient mental status improves, reevaluate for any focal neurologic signs. If positive, get MRI of brain to rule out stroke.  Hypertension: - irbesartan -Hydralazine when necessary  HLD: Last LDL was not on  Record. Not taking med at home -Check FLP  Diet-controlled Diabetes mellitus without complication: No A1c on record, patient is not taking medications at home -Check A1c  L lower chest wall  pain: -CXR to r/o rib fracture -percocet prn for pain   DVT ppx: SQ Heparin     Code Status: Full code Family Communication: None at bed side.  Disposition Plan: Admit to inpatient   Date of Service 11/29/2014    Lorretta Harp [facility] Pager [phone]  If 7PM-7AM, please contact night-coverage [email or web address] Password [email or web address] 11/29/2014, 5:01 AM

## 2014-11-29 NOTE — Progress Notes (Addendum)
ANTIBIOTIC CONSULT NOTE - INITIAL  Pharmacy Consult for Aztreonam Indication: UTI  Allergies  Allergen Reactions  . Aspirin Other (See Comments)    Bleeding   . Penicillins Rash    Has patient had a PCN reaction causing immediate rash, facial/tongue/throat swelling, SOB or lightheadedness with hypotension: No Has patient had a PCN reaction causing severe rash involving mucus membranes or skin necrosis: No Has patient had a PCN reaction that required hospitalization No Has patient had a PCN reaction occurring within the last 10 years: No If all of the above answers are "NO", then may proceed with Cephalosporin use.     Patient Measurements:    Vital Signs: Temp: 99.4 F (37.4 C) (09/10 0027) Temp Source: Rectal (09/10 0027) BP: 175/85 mmHg (09/10 0248) Pulse Rate: 88 (09/10 0248) Intake/Output from previous day:   Intake/Output from this shift:    Labs:  Recent Labs  11/28/14 2342  WBC Lynn.1*  HGB 13.7  PLT 298  CREATININE 0.86   CrCl cannot be calculated (Unknown ideal weight.). No results for input(s): VANCOTROUGH, VANCOPEAK, VANCORANDOM, GENTTROUGH, GENTPEAK, GENTRANDOM, TOBRATROUGH, TOBRAPEAK, TOBRARND, AMIKACINPEAK, AMIKACINTROU, AMIKACIN in the last 72 hours.   Microbiology: No results found for this or any previous visit (from the past 720 hour(s)).  Medical History: Past Medical History  Diagnosis Date  . Hypertension   . Diabetes mellitus   . Obesity   . Hyperlipemia   . Osteoarthritis   . Gout   . Depression   . Hemorrhoid   . Iron deficiency anemia secondary to blood loss (chronic)     Medications:  Scheduled:  . heparin  5,000 Units Subcutaneous 3 times per day  . irbesartan  75 mg Oral Daily  . sodium chloride  3 mL Intravenous Q12H   Infusions:  . sodium chloride    . aztreonam    . sodium chloride     Assessment:  74 yr [name] presents with UTI  Ceftriaxone 2gm IV x 1 given in ED.  Patient with noted PCN allergy; no reaction to  Ceftriaxone noted  Upon admission, pharmacy has been requested to dose Aztreonam for treatment of UTI  Blood and urine cultures ordered  Goal of Therapy:  Eradication of infection  Plan:  Follow up culture results  Aztreonam 1gm IV x 1 F/U height and weight once documented in EPIC to determine CrCl.  Will write further orders once renal function is known  Guyla Bless, Joselyn Glassman, PharmD 11/29/2014,3:03 AM  ADDENDUM:  Height - 60 inches Weight - 67.1 kg CrCl ~ 51 ml/min  Plan:  Aztreonam 1gm IV qih            F/u culture results.  Terrilee Files, PharmD 05:50 11/28/08

## 2014-11-30 DIAGNOSIS — I10 Essential (primary) hypertension: Secondary | ICD-10-CM

## 2014-11-30 DIAGNOSIS — G9340 Encephalopathy, unspecified: Secondary | ICD-10-CM

## 2014-11-30 LAB — GLUCOSE, CAPILLARY: GLUCOSE-CAPILLARY: 113 mg/dL — AB (ref 65–99)

## 2014-11-30 NOTE — Progress Notes (Addendum)
PROGRESS NOTE    [name] ZOX:[id number] DOB: 1940/10/12 DOA: 11/28/2014 PCP: No primary care provider on file.  HPI/Brief narrative 74 year old female patient with history of HTN, HLD, diet-controlled diabetes, gout, depression, urinary incontinence, admitted to [facility] on 11/28/14 with complaints of altered mental status and fall. As per report, confusion and multiple falls over the last 6 days PTA. Normally able to transfer by herself and uses a cane and a wheelchair for mobility. Dizziness +. In ED, patient was found to have negative CT-head for acute abnormalities, positive urinalysis for UTI, WBC 12.1, temperature 99.4, lactate of 2.12, tachycardia, electrolytes okay. X-ray of left ankle is negative for any fracture. The patient is admitted to inpatient for further evaluated and treatment.   Assessment/Plan:  UTI - Started empirically on IV aztreonam pending urine culture results-continue - Mild sepsis present on admission (heart rate in the 110s, temperature 99.4, WBC 12.1 & elevated lactate) - Sepsis physiology has resolved. Lactate normalized. Pro-calcitonin low. - Blood cultures 2 and urine culture 1 pending  Acute encephalopathy - Likely secondary to UTI sepsis - CT head negative - Treat underlying cause. Resolved.  Recurrent falls - Likely due to UTI complicating advanced age. No focal neurological deficits. PT recommends SNF. Clinical social worker consulted.  Essential hypertension - Mildly uncontrolled. Continue irbesartan. Added when necessary IV hydralazine. Monitor and may need some adjustment.  Hyperlipidemia - Not on statins at home  Diet-controlled DM 2  Left lower chest wall pain - No fractures reported on chest x-ray  RUL pulmonary nodule 6 mm - Reported on portable chest x-ray. Obtain 2 view chest x-ray. May need further evaluation by CT-can be done as outpatient.      DVT prophylaxis: Subcutaneous heparin Code Status: DO NOT  RESUSCITATE as of 11/30/14-as per patient's wishes. Family Communication: None at bedside Disposition Plan: DC SNF pending urine culture results, possibly 12/01/14   Consultants:  None  Procedures:  None  Antibiotics:  IV aztreonam 9/9 >  IV Rocephin 1 dose on 9/9   Subjective: States that she lives in her 74 year old house with her disabled daughter. Her son visits once a week to stock home supplies. Ambulates with the help of a cane and has 3 steps to get into her house. Gives history of falls. Has concerns regarding some legal issues pertaining to home-wants to speak to someone. Wants to be DO NOT RESUSCITATE.  Objective: Filed Vitals:   11/29/14 1406 11/29/14 1906 11/29/14 2125 11/30/14 0630  BP: 179/97 169/79 143/65 120/69  Pulse: 91  87 86  Temp: 97.5 F (36.4 C)  97.4 F (36.3 C) 97.4 F (36.3 C)  TempSrc: Oral  Oral Oral  Resp: 20  20 20   Height:      Weight:      SpO2: 98%  98% 98%    Intake/Output Summary (Last 24 hours) at 11/30/14 1101 Last data filed at 11/30/14 0919  Gross per 24 hour  Intake    460 ml  Output      7 ml  Net    453 ml   Filed Weights   11/29/14 0330  Weight: 67.1 kg (147 lb 14.9 oz)     Exam:  General exam: Moderately built and frail elderly female lying comfortably propped up in bed Respiratory system: Clear. No increased work of breathing. Cardiovascular system: S1 & S2 heard, RRR. No JVD, murmurs, gallops, clicks or pedal edema.  Gastrointestinal system: Abdomen is nondistended, soft and nontender. Normal bowel  sounds heard. Central nervous system: Alert and oriented to self and place. No focal neurological deficits. Extremities: Symmetric 5 x 5 power.   Data Reviewed: Basic Metabolic Panel:  Recent Labs Lab 11/28/14 2342 12/25/14 0340  NA 141 141  K 3.6 3.5  CL 105 104  CO2 26 28  GLUCOSE 156* 140*  BUN 27* 25*  CREATININE 0.86 0.79  CALCIUM 10.3 9.7   Liver Function Tests:  Recent Labs Lab  11/28/14 2342  AST 17  ALT 11*  ALKPHOS 70  BILITOT 1.1  PROT 7.5  ALBUMIN 3.7   No results for input(s): LIPASE, AMYLASE in the last 168 hours. No results for input(s): AMMONIA in the last 168 hours. CBC:  Recent Labs Lab 11/28/14 2342 Dec 25, 2014 0340  WBC 12.1* 9.8  NEUTROABS 10.0*  --   HGB 13.7 12.7  HCT 42.1 40.0  MCV 97.2 97.8  PLT 298 296   Cardiac Enzymes: No results for input(s): CKTOTAL, CKMB, CKMBINDEX, TROPONINI in the last 168 hours. BNP (last 3 results) No results for input(s): PROBNP in the last 8760 hours. CBG:  Recent Labs Lab 12/25/14 0729 12-25-2014 1102 11/30/14 0722  GLUCAP 114* 137* 113*    No results found for this or any previous visit (from the past 240 hour(s)).         Studies: Dg Ankle Complete Left  25-Dec-2014   CLINICAL DATA:  74 year old female with fall and ankle pain.  EXAM: LEFT ANKLE COMPLETE - 3+ VIEW  COMPARISON:  None.  FINDINGS: No acute fracture or dislocation. The ankle mortise is intact. There is osteopenia with degenerative changes. The soft tissues are unremarkable. No radiopaque foreign object. A 7 mm calcaneal spur.  IMPRESSION: No acute fracture or dislocation.   Electronically Signed   By: Elgie Collard M.D.   On: 12/25/2014 02:08   Ct Head Wo Contrast  25-Dec-2014   CLINICAL DATA:  Weakness, confusion, and recent falls.  EXAM: CT HEAD WITHOUT CONTRAST  TECHNIQUE: Contiguous axial images were obtained from the base of the skull through the vertex without intravenous contrast.  COMPARISON:  None.  FINDINGS: Mild diffuse cerebral atrophy. Patchy low-attenuation changes in the deep white matter consistent with small vessel ischemia. No mass effect or midline shift. No abnormal extra-axial fluid collections. Gray-white matter junctions are distinct. Basal cisterns are not effaced. No evidence of acute intracranial hemorrhage. No depressed skull fractures. Visualized paranasal sinuses and mastoid air cells are not opacified.  Vascular calcifications.  IMPRESSION: No acute intracranial abnormalities. Chronic atrophy and small vessel ischemic changes.   Electronically Signed   By: Burman Nieves M.D.   On: 12-25-14 00:01   Dg Chest Portable 1 View  December 25, 2014   CLINICAL DATA:  74 year old female with leukocytosis  EXAM: PORTABLE CHEST - 1 VIEW  COMPARISON:  None.  FINDINGS: Single-view of the chest demonstrate mild emphysematous changes of the lungs. There is no focal consolidation, pleural effusion, or pneumothorax. A 6 mm nodular density noted in the right upper lung field projecting over the right anterior third rib. It is indeterminate whether this lesion is on the skin or in the lung on this single view study. PA and lateral projections may provide better evaluation. Correlation with prior studies if available recommended to evaluate for interval change. If prior studies are not available CT is recommended for better evaluation of the lungs.  The aorta is tortuous. Mild cardiomegaly. No pleural effusion or pneumothorax. The osseous structures are grossly unremarkable.  IMPRESSION: A 6  mm nodule in the right upper lung field as described. Comparison with prior studies if available or further evaluation with CT is recommended.   Electronically Signed   By: Elgie Collard M.D.   On: 11/29/2014 03:42        Scheduled Meds: . antiseptic oral rinse  7 mL Mouth Rinse q12n4p  . aztreonam  1 g Intravenous Q8H  . chlorhexidine  15 mL Mouth Rinse BID  . heparin  5,000 Units Subcutaneous 3 times per day  . irbesartan  75 mg Oral Daily  . sodium chloride  3 mL Intravenous Q12H   Continuous Infusions:    Principal Problem:   UTI (lower urinary tract infection) Active Problems:   Hypertension   Hyperlipemia   Diabetes mellitus without complication   Sepsis   Acute encephalopathy   Essential hypertension   Fall    Time spent: 20 minutes    Ayuub Penley, MD, FACP, FHM. [facility] Pager  [phone]  If 7PM-7AM, please contact night-coverage [email or web address] Password [email or web address] 11/30/2014, 11:01 AM    LOS: 1 day

## 2014-11-30 NOTE — Progress Notes (Signed)
Utilization Review Completed.[name] T9/01/2015  

## 2014-12-01 ENCOUNTER — Inpatient Hospital Stay (HOSPITAL_COMMUNITY): Payer: Medicare Other

## 2014-12-01 LAB — HEMOGLOBIN A1C
HEMOGLOBIN A1C: 5.7 % — AB (ref 4.8–5.6)
MEAN PLASMA GLUCOSE: 117 mg/dL

## 2014-12-01 LAB — GLUCOSE, CAPILLARY: GLUCOSE-CAPILLARY: 81 mg/dL (ref 65–99)

## 2014-12-01 MED ORDER — AMLODIPINE BESYLATE 2.5 MG PO TABS
2.5000 mg | ORAL_TABLET | Freq: Every day | ORAL | Status: DC
  Administered 2014-12-01 – 2014-12-02 (×2): 2.5 mg via ORAL
  Filled 2014-12-01 (×2): qty 1

## 2014-12-01 NOTE — Progress Notes (Addendum)
PROGRESS NOTE    [name]:[id number] DOB: Apr 14, 1940 DOA: 11/28/2014 PCP: No primary care provider on file.  HPI/Brief narrative 74 year old female patient with history of HTN, HLD, diet-controlled diabetes, gout, depression, urinary incontinence, admitted to [facility] on 11/28/14 with complaints of altered mental status and fall. As per report, confusion and multiple falls over the last 6 days PTA. Normally able to transfer by herself and uses a cane and a wheelchair for mobility. Dizziness +. In ED, patient was found to have negative CT-head for acute abnormalities, positive urinalysis for UTI, WBC 12.1, temperature 99.4, lactate of 2.12, tachycardia, electrolytes okay. X-ray of left ankle is negative for any fracture. The patient is admitted to inpatient for further evaluated and treatment.   Assessment/Plan:  Escherichia coli UTI - Started empirically on IV aztreonam pending urine culture results-continue - Mild sepsis present on admission (heart rate in the 110s, temperature 99.4, WBC 12.1 & elevated lactate) - Sepsis physiology has resolved. Lactate normalized. Pro-calcitonin low. - Blood cultures 2: Negative to date and urine culture 1 Escherichia coli-sensitivities pending  Acute encephalopathy - Likely secondary to UTI sepsis - CT head negative - Treat underlying cause. - As per discussion with son on 9/12: Mental status has improved by about 50%. She had issues with mild intermittent memory loss prior to admission but no diagnosis of dementia. - As per son, patient was favoring her left side on day of admission although she did not have any slurred speech or facial asymmetry. We will get MRI brain to rule out CVAs.  Recurrent falls - Likely due to UTI complicating advanced age. No focal neurological deficits. PT recommends SNF. Clinical social worker consulted. - As per son, patient has not been ambulating for about 3 years. She moves around at home with the  help of a wheelchair and is able to take a step or 2 off the wheelchair to move to bed or bedside commode. Recently slid off the bed to the floor without significant injuries. Last significant fall was in January 2016.  Essential hypertension - Mildly uncontrolled. Continue irbesartan. Added when necessary IV hydralazine. We will add low-dose amlodipine 2.5 MG daily.   Hyperlipidemia - Not on statins at home  Diet-controlled DM 2  Left lower chest wall pain - No fractures reported on chest x-ray  RUL pulmonary nodule 6 mm - Reported on portable chest x-ray. Obtain 2 view chest x-ray. May need further evaluation by CT-can be done as outpatient.      DVT prophylaxis: Subcutaneous heparin Code Status: on 9/12, patient and son decided that she wishes to be full code.  Family Communication: discussed extensively with patient's son at bedside. Updated care and answered questions.  Disposition Plan: DC SNF pending urine culture results, possibly 12/02/14   Consultants:  None  Procedures:  None  Antibiotics:  IV aztreonam 9/9 >  IV Rocephin 1 dose on 9/9   Subjective: Today she wants to be full code-confirmed by son at bedside. As per son, mental status has improved by 50% compared to admission. Intermittent confusion. No dyspnea or other complaints reported.   Objective: Filed Vitals:   11/30/14 1413 11/30/14 2114 12/01/14 0426 12/01/14 0630  BP: 159/86 170/81 199/100 173/77  Pulse: 74 76 77 64  Temp: 97.6 F (36.4 C) 97.7 F (36.5 C) 97.5 F (36.4 C)   TempSrc: Oral Oral Oral   Resp: 18 16 18    Height:      Weight:  SpO2: 98% 99% 99%     Intake/Output Summary (Last 24 hours) at 12/01/14 1154 Last data filed at 12/01/14 0846  Gross per 24 hour  Intake    360 ml  Output      0 ml  Net    360 ml   Filed Weights   11/29/14 0330  Weight: 67.1 kg (147 lb 14.9 oz)     Exam:  General exam: Moderately built and frail elderly female lying comfortably  propped up in bed Respiratory system: Clear. No increased work of breathing. Cardiovascular system: S1 & S2 heard, RRR. No JVD, murmurs, gallops, clicks or pedal edema.  Gastrointestinal system: Abdomen is nondistended, soft and nontender. Normal bowel sounds heard. Central nervous system: Alert and oriented to self and place. No focal neurological deficits. Extremities: Symmetric 5 x 5 power.   Data Reviewed: Basic Metabolic Panel:  Recent Labs Lab 11/28/14 2342 11/29/14 0340  NA 141 141  K 3.6 3.5  CL 105 104  CO2 26 28  GLUCOSE 156* 140*  BUN 27* 25*  CREATININE 0.86 0.79  CALCIUM 10.3 9.7   Liver Function Tests:  Recent Labs Lab 11/28/14 2342  AST 17  ALT 11*  ALKPHOS 70  BILITOT 1.1  PROT 7.5  ALBUMIN 3.7   No results for input(s): LIPASE, AMYLASE in the last 168 hours. No results for input(s): AMMONIA in the last 168 hours. CBC:  Recent Labs Lab 11/28/14 2342 11/29/14 0340  WBC 12.1* 9.8  NEUTROABS 10.0*  --   HGB 13.7 12.7  HCT 42.1 40.0  MCV 97.2 97.8  PLT 298 296   Cardiac Enzymes: No results for input(s): CKTOTAL, CKMB, CKMBINDEX, TROPONINI in the last 168 hours. BNP (last 3 results) No results for input(s): PROBNP in the last 8760 hours. CBG:  Recent Labs Lab 11/29/14 0729 11/29/14 1102 11/30/14 0722 12/01/14 0735  GLUCAP 114* 137* 113* 81    Recent Results (from the past 240 hour(s))  Urine culture     Status: None (Preliminary result)   Collection Time: 11/29/14  1:12 AM  Result Value Ref Range Status   Specimen Description URINE, RANDOM  Final   Special Requests NONE  Final   Culture   Final    >=100,000 COLONIES/mL ESCHERICHIA COLI Performed at [facility]    Report Status PENDING  Incomplete  Blood culture (routine x 2)     Status: None (Preliminary result)   Collection Time: 11/29/14  2:15 AM  Result Value Ref Range Status   Specimen Description BLOOD BLOOD LEFT HAND  Final   Special Requests BOTTLES DRAWN  AEROBIC AND ANAEROBIC 5.5ML  Final   Culture   Final    NO GROWTH 1 DAY Performed at [facility]    Report Status PENDING  Incomplete  Blood culture (routine x 2)     Status: None (Preliminary result)   Collection Time: 11/29/14  2:25 AM  Result Value Ref Range Status   Specimen Description BLOOD BLOOD RIGHT HAND  Final   Special Requests IN PEDIATRIC BOTTLE  Final   Culture   Final    NO GROWTH 1 DAY Performed at [facility]    Report Status PENDING  Incomplete           Studies: No results found.      Scheduled Meds: . antiseptic oral rinse  7 mL Mouth Rinse q12n4p  . aztreonam  1 g Intravenous Q8H  . chlorhexidine  15 mL Mouth  Rinse BID  . heparin  5,000 Units Subcutaneous 3 times per day  . irbesartan  75 mg Oral Daily  . sodium chloride  3 mL Intravenous Q12H   Continuous Infusions:    Principal Problem:   UTI (lower urinary tract infection) Active Problems:   Hypertension   Hyperlipemia   Diabetes mellitus without complication   Sepsis   Acute encephalopathy   Essential hypertension   Fall    Time spent: 20 minutes    Averiana Clouatre, MD, FACP, FHM. [facility] Pager [phone]  If 7PM-7AM, please contact night-coverage [email or web address] Password [email or web address] 12/01/2014, 11:54 AM    LOS: 2 days

## 2014-12-01 NOTE — Care Management Important Message (Signed)
Important Message  Patient Details  Name: [name] MRN: [id number] Date of Birth: 1940/10/11   Medicare Important Message Given:  Yes-second notification given    [name] 12/01/2014, 12:12 PMImportant Message  Patient Details  Name: [name] MRN: [id number] Date of Birth: 1940-11-04   Medicare Important Message Given:  Yes-second notification given    [name] 12/01/2014, 12:12 PM

## 2014-12-01 NOTE — Clinical Social Work Note (Signed)
Clinical Social Work Assessment  Patient Details  Name: [name] MRN: [id number] Date of Birth: [birth year]  Date of referral:  12/01/14               Reason for consult:  Facility Placement                Permission sought to share information with:  Oceanographer granted to share information::  Yes, Verbal Permission Granted  Name::        Agency::     Relationship::     Contact Information:     Housing/Transportation Living arrangements for the past 2 months:  Single Family Home Source of Information:  Patient Patient Interpreter Needed:  None Criminal Activity/Legal Involvement Pertinent to Current Situation/Hospitalization:  No - Comment as needed Significant Relationships:  Adult Children Lives with:  Self Do you feel safe going back to the place where you live?  No Need for family participation in patient care:  Yes (Comment)  Care giving concerns:  CSW reviewed PT evaluation recommending SNF at discharge.    Social Worker assessment / plan:  CSW spoke with patient & son, [name] at bedside re: discharge planning - both are in agreement with plan for SNF at discharge.   Employment status:  Retired Database administrator PT Recommendations:  Skilled Nursing Facility Information / Referral to community resources:  Skilled Nursing Facility  Patient/Family's Response to care:  Patient's son was provided with SNF list/bed offers and has accepted bed at [facility]. Dois Davenport at [facility] aware - anticipating discharge tomorrow, 9/13.   Patient/Family's Understanding of and Emotional Response to Diagnosis, Current Treatment, and Prognosis:  Patient's son is concerned about his mother going back to living alone and feels that her going to a SNF may be a long term, permanent move.   Emotional Assessment Appearance:  Appears stated age Attitude/Demeanor/Rapport:    Affect (typically observed):  Pleasant, Quiet,  Accepting Orientation:  Oriented to Self, Oriented to Place Alcohol / Substance use:    Psych involvement (Current and /or in the community):     Discharge Needs  Concerns to be addressed:    Readmission within the last 30 days:    Current discharge risk:    Barriers to Discharge:      Arlyss Repress, LCSW 12/01/2014, 1:38 PM

## 2014-12-01 NOTE — Evaluation (Signed)
Occupational Therapy Evaluation Patient Details Name: [name] MRN: [id number] DOB: [birth year] Today's Date: 12/01/2014    History of Present Illness 74 year old female patient with history of HTN, HLD, diet-controlled diabetes, gout, depression, urinary incontinence, admitted to [facility] on 11/28/14 with complaints of altered mental status and fall. As per report, confusion and multiple falls over the last 6 days PTA.   Clinical Impression   Pt admitted with HTN. Pt currently with functional limitations due to the deficits listed below (see OT Problem List).  Pt will benefit from skilled OT to increase their safety and independence with ADL and functional mobility for ADL to facilitate discharge to venue listed below.      Follow Up Recommendations  SNF    Equipment Recommendations  None recommended by OT       Precautions / Restrictions Precautions Precautions: Fall Precaution Comments: 4 falls in 3 days before hospitalization Restrictions Weight Bearing Restrictions: No      Mobility Bed Mobility Overal bed mobility: Needs Assistance Bed Mobility: Rolling Rolling: Mod assist         General bed mobility comments: pt did sit EOB- but unable to reach a full stand  Transfers Overall transfer level: Needs assistance Equipment used: Rolling walker (2 wheeled) Transfers: Sit to/from Stand                     ADL Overall ADL's : Needs assistance/impaired Eating/Feeding: Minimal assistance;Cueing for safety   Grooming: Sitting;Minimal assistance                       Toileting- Clothing Manipulation and Hygiene: Total assistance;Sit to/from stand Toileting - Clothing Manipulation Details (indicate cue type and reason): unable to reach a full stand                       Pertinent Vitals/Pain Pain Assessment: No/denies pain     Hand Dominance     Extremity/Trunk Assessment Upper Extremity Assessment Upper Extremity  Assessment: Generalized weakness           Communication Communication Communication: No difficulties   Cognition Arousal/Alertness: Awake/alert Behavior During Therapy: Anxious Overall Cognitive Status: Within Functional Limits for tasks assessed                                Home Living Family/patient expects to be discharged to:: Private residence Living Arrangements: Children (daughter is unable to A pt with mobility) Available Help at Discharge: Available PRN/intermittently Type of Home: House Home Access: Ramped entrance     Home Layout: One level     Bathroom Shower/Tub: Other (comment) (does sponge baths)         Home Equipment: Wheelchair - manual;Cane - single point;Bedside commode;Walker - 2 wheels   Additional Comments: Pt propels w/c with 2 canes.      Prior Functioning/Environment Level of Independence: Needs assistance  Gait / Transfers Assistance Needed: Pt had been ambulating from bathroom door to toilet until recent fall and uses w/c the rest of the time.  Transfers herself I'ly.  Pt reports he daughter is unable to A her.          OT Diagnosis: Generalized weakness   OT Problem List: Decreased strength;Decreased activity tolerance;Impaired balance (sitting and/or standing);Decreased safety awareness   OT Treatment/Interventions: Self-care/ADL training;DME and/or AE instruction;Patient/family education    OT Goals(Current goals can be  found in the care plan section) ADL Goals Pt Will Perform Grooming: with supervision;standing Pt Will Transfer to Toilet: with supervision;bedside commode;ambulating Pt Will Perform Toileting - Clothing Manipulation and hygiene: with min assist;sit to/from stand  OT Frequency: Min 2X/week   Barriers to D/C:               End of Session Nurse Communication: Mobility status  Activity Tolerance: Patient tolerated treatment well Patient left: in bed;with call bell/phone within reach     [facility], Karin Golden D 12/01/2014, 10:42 AM

## 2014-12-01 NOTE — Clinical Social Work Placement (Signed)
Patient's son was provided with SNF list/bed offers and has accepted bed at [facility]. Dois Davenport at [facility] aware - anticipating discharge tomorrow, 9/13.     [name], LCSW [facility] Clinical Social Worker cell #: [phone]    CLINICAL SOCIAL WORK PLACEMENT  NOTE  Date:  12/01/2014  Patient Details  Name: [name] MRN: [id number] Date of Birth: December 04, 1940  Clinical Social Work is seeking post-discharge placement for this patient at the Skilled  Nursing Facility level of care (*CSW will initial, date and re-position this form in  chart as items are completed):  Yes   Patient/family provided with [facility] Clinical Social Work Department's list of facilities offering this level of care within the geographic area requested by the patient (or if unable, by the patient's family).  Yes   Patient/family informed of their freedom to choose among providers that offer the needed level of care, that participate in Medicare, Medicaid or managed care program needed by the patient, have an available bed and are willing to accept the patient.  Yes   Patient/family informed of [place]'s ownership interest in [facility] and [facility], as well as of the fact that they are under no obligation to receive care at these facilities.  PASRR submitted to EDS on 12/01/14     PASRR number received on 12/01/14     Existing PASRR number confirmed on       FL2 transmitted to all facilities in geographic area requested by pt/family on 12/01/14     FL2 transmitted to all facilities within larger geographic area on       Patient informed that his/her managed care company has contracts with or will negotiate with certain facilities, including the following:        Yes   Patient/family informed of bed offers received.  Patient chooses bed at [facility]     Physician recommends and patient chooses bed at      Patient to be transferred to  [facility] on  .  Patient to be transferred to facility by       Patient family notified on   of transfer.  Name of family member notified:        PHYSICIAN       Additional Comment:    _______________________________________________ Arlyss Repress, LCSW 12/01/2014, 1:40 PM

## 2014-12-02 DIAGNOSIS — A419 Sepsis, unspecified organism: Principal | ICD-10-CM

## 2014-12-02 LAB — URINE CULTURE

## 2014-12-02 LAB — GLUCOSE, CAPILLARY: Glucose-Capillary: 82 mg/dL (ref 65–99)

## 2014-12-02 MED ORDER — AMLODIPINE BESYLATE 5 MG PO TABS: 5.0000 mg | ORAL_TABLET | Freq: Every day | ORAL | Status: DC

## 2014-12-02 MED ORDER — CEPHALEXIN 500 MG PO CAPS: 500.0000 mg | ORAL_CAPSULE | Freq: Two times a day (BID) | ORAL | Status: AC

## 2014-12-02 NOTE — Progress Notes (Signed)
ANTIBIOTIC CONSULT NOTE - FOLLOW UP  Pharmacy Consult for Aztreonam Indication: UTI  Allergies  Allergen Reactions  . Aspirin Other (See Comments)    Bleeding   . Latex Itching  . Penicillins Rash    Has patient had a PCN reaction causing immediate rash, facial/tongue/throat swelling, SOB or lightheadedness with hypotension: No Has patient had a PCN reaction causing severe rash involving mucus membranes or skin necrosis: No Has patient had a PCN reaction that required hospitalization No Has patient had a PCN reaction occurring within the last 10 years: No If all of the above answers are "NO", then may proceed with Cephalosporin use.     Patient Measurements: Height: 5' (152.4 cm) Weight: 147 lb 14.9 oz (67.1 kg) IBW/kg (Calculated) : 45.5   Vital Signs: Temp: 97.6 F (36.4 C) (09/13 0523) Temp Source: Oral (09/13 0523) BP: 155/81 mmHg (09/13 0629) Pulse Rate: 99 (09/13 0629) Intake/Output from previous day: 09/12 0701 - 09/13 0700 In: 1060 [P.O.:960; IV Piggyback:100] Out: 4 [Urine:4] Intake/Output from this shift:    Labs: No results for input(s): WBC, HGB, PLT, LABCREA, CREATININE in the last 72 hours. Estimated Creatinine Clearance: 51.1 mL/min (by C-G formula based on Cr of 0.79). No results for input(s): VANCOTROUGH, VANCOPEAK, VANCORANDOM, GENTTROUGH, GENTPEAK, GENTRANDOM, TOBRATROUGH, TOBRAPEAK, TOBRARND, AMIKACINPEAK, AMIKACINTROU, AMIKACIN in the last 72 hours.   Microbiology: Recent Results (from the past 720 hour(s))  Urine culture     Status: None (Preliminary result)   Collection Time: 11/29/14  1:12 AM  Result Value Ref Range Status   Specimen Description URINE, RANDOM  Final   Special Requests NONE  Final   Culture   Final    >=100,000 COLONIES/mL ESCHERICHIA COLI Performed at [facility]    Report Status PENDING  Incomplete  Blood culture (routine x 2)     Status: None (Preliminary result)   Collection Time: 11/29/14  2:15 AM  Result  Value Ref Range Status   Specimen Description BLOOD BLOOD LEFT HAND  Final   Special Requests BOTTLES DRAWN AEROBIC AND ANAEROBIC 5.5ML  Final   Culture   Final    NO GROWTH 2 DAYS Performed at [facility]    Report Status PENDING  Incomplete  Blood culture (routine x 2)     Status: None (Preliminary result)   Collection Time: 11/29/14  2:25 AM  Result Value Ref Range Status   Specimen Description BLOOD BLOOD RIGHT HAND  Final   Special Requests IN PEDIATRIC BOTTLE  Final   Culture   Final    NO GROWTH 2 DAYS Performed at [facility]    Report Status PENDING  Incomplete    Anti-infectives    Start     Dose/Rate Route Frequency Ordered Stop   11/29/14 1300  aztreonam (AZACTAM) 1 g in dextrose 5 % 50 mL IVPB     1 g 100 mL/hr over 30 Minutes Intravenous Every 8 hours 11/29/14 0547     11/29/14 0330  aztreonam (AZACTAM) 1 g in dextrose 5 % 50 mL IVPB     1 g 100 mL/hr over 30 Minutes Intravenous  Once 11/29/14 0303 11/29/14 0519   11/29/14 0215  cefTRIAXone (ROCEPHIN) 2 g in dextrose 5 % 50 mL IVPB  Status:  Discontinued     2 g 100 mL/hr over 30 Minutes Intravenous  Once 11/29/14 0201 11/29/14 0315      Assessment:  74 yr female presents with UTI  Ceftriaxone 2gm IV x 1  given in ED. Patient with noted PCN allergy; no reaction to Ceftriaxone noted  Upon admission, pharmacy has been requested to dose Aztreonam for treatment of UTI  Blood and urine cultures ordered  Temp: afebrile since admit WBC: wnl Renal: SCr stable wnl; CrCl 51 CG PCT mildly elevated, LA wnl  9/10 >>Ceftriaxone x 1 9/10 >>Aztreonam >>    9/10 bcx x2: ngtd 9/10 urine: > 100k Ecoli (sens pending)   Goal of Therapy:  Eradication of infection Appropriate antibiotic dosing for indication and renal function  Plan:  Day 4 antibiotics  Continue aztreonam 1g IV q8 hr for now; hopefully can narrow soon  Follow clinical course, renal function, culture results as  available  Follow for de-escalation of antibiotics and LOT  Note that patient did not have any adverse reaction to Rocephin, would consider using this agent in the future for empiric treatment of UTI over aztreonam.   Bernadene Person, PharmD, BCPS Pager: [phone] 12/02/2014, 10:09 AM

## 2014-12-02 NOTE — Clinical Social Work Placement (Signed)
Patient is set to discharge to [facility] today. Patient & son, Jeannett Senior aware. Discharge packet given to RN, April. PTAR called for transport.     [name], LCSW [facility] Clinical Social Worker cell #: [phone]   CLINICAL SOCIAL WORK PLACEMENT  NOTE  Date:  12/02/2014  Patient Details  Name: [name] MRN: [id number] Date of Birth: 15-May-1940  Clinical Social Work is seeking post-discharge placement for this patient at the Skilled  Nursing Facility level of care (*CSW will initial, date and re-position this form in  chart as items are completed):  Yes   Patient/family provided with [facility] Clinical Social Work Department's list of facilities offering this level of care within the geographic area requested by the patient (or if unable, by the patient's family).  Yes   Patient/family informed of their freedom to choose among providers that offer the needed level of care, that participate in Medicare, Medicaid or managed care program needed by the patient, have an available bed and are willing to accept the patient.  Yes   Patient/family informed of [place]'s ownership interest in [facility] and [facility], as well as of the fact that they are under no obligation to receive care at these facilities.  PASRR submitted to EDS on 12/01/14     PASRR number received on 12/01/14     Existing PASRR number confirmed on       FL2 transmitted to all facilities in geographic area requested by pt/family on 12/01/14     FL2 transmitted to all facilities within larger geographic area on       Patient informed that his/her managed care company has contracts with or will negotiate with certain facilities, including the following:        Yes   Patient/family informed of bed offers received.  Patient chooses bed at [facility]     Physician recommends and patient chooses bed at      Patient to be transferred to [facility] on 12/02/14.  Patient to be transferred to facility by PTAR     Patient family notified on 12/02/14 of transfer.  Name of family member notified:  patient's son, Jeannett Senior at bedside     PHYSICIAN       Additional Comment:    _______________________________________________ Arlyss Repress, LCSW 12/02/2014, 1:45 PM

## 2014-12-02 NOTE — Care Management Note (Signed)
Case Management Note  Patient Details  Name: [name] MRN: [id number] Date of Birth: [birth year]  Subjective/Objective:                    Action/Plan:d/c SNF.   Expected Discharge Date:               Expected Discharge Plan:  Skilled Nursing Facility  In-House Referral:  Clinical Social Work  Discharge planning Services  CM Consult  Post Acute Care Choice:    Choice offered to:     DME Arranged:    DME Agency:     HH Arranged:    HH Agency:     Status of Service:  Completed, signed off  Medicare Important Message Given:  Yes-second notification given Date Medicare IM Given:    Medicare IM give by:    Date Additional Medicare IM Given:    Additional Medicare Important Message give by:     If discussed at Long Length of Stay Meetings, dates discussed:    Additional Comments:  Lanier Clam, RN 12/02/2014, 1:42 PM

## 2014-12-02 NOTE — Progress Notes (Signed)
Report called to [facility] [place] LPN

## 2014-12-02 NOTE — Progress Notes (Signed)
D/c'd via stretcher W/ PTAR voices no c/o.

## 2014-12-02 NOTE — Discharge Summary (Signed)
Physician Discharge Summary  [name] ZOX:[id number] DOB: [birth year] DOA: 11/28/2014  PCP: Gaye Alken, MD  Admit date: 11/28/2014 Discharge date: 12/02/2014  Time spent: Greater than 30 minutes  Recommendations for Outpatient Follow-up:  1. M.D. at SNF in 3-5 days from discharge. Please follow final blood culture results that were sent from the hospital. 2. Dr. Juluis Rainier, PCP, after discharge from SNF. 3. Recommend outpatient evaluation off RUL pulmonary nodule that was seen on chest x-ray, as deemed necessary- CT chest.  Discharge Diagnoses:  Principal Problem:   UTI (lower urinary tract infection) Active Problems:   Hypertension   Hyperlipemia   Diabetes mellitus without complication   Sepsis   Acute encephalopathy   Essential hypertension   Fall   Discharge Condition: Improved & Stable  Diet recommendation: Heart healthy diet.  Filed Weights   11/29/14 0330  Weight: 67.1 kg (147 lb 14.9 oz)    History of present illness:  74 year old female patient with history of HTN, HLD, diet-controlled diabetes, gout, depression, urinary incontinence, admitted to [facility] on 11/28/14 with complaints of altered mental status and fall. As per report, confusion and multiple falls over the last 6 days PTA. Normally able to transfer by herself and uses a cane and a wheelchair for mobility. Dizziness +. In ED, patient was found to have negative CT-head for acute abnormalities, positive urinalysis for UTI, WBC 12.1, temperature 99.4, lactate of 2.12, tachycardia, electrolytes okay. X-ray of left ankle is negative for any fracture. The patient is admitted to inpatient for further evaluated and treatment.  Hospital Course:   Escherichia coli UTI - Started empirically on IV aztreonam pending urine culture results and has completed a little greater than 3 days course. - Mild sepsis present on admission (heart rate in the 110s, temperature 99.4, WBC 12.1 & elevated  lactate) - Sepsis physiology has resolved. Lactate normalized. Pro-calcitonin low. - Blood cultures 2: Negative to date and urine culture 1 Escherichia coli - Based on sensitivities, we will transition to oral Keflex to complete total 7 days treatment (patient tolerated a dose of IV ceftriaxone in the hospital).   Acute encephalopathy - Likely secondary to UTI sepsis - CT head negative - Treat underlying cause. - As reported by son on 9/12, some concern regarding CVA symptoms prior to admission and hence MRI brain obtained and negative for CVA. - As per son 9/13, mental status has almost returned to baseline and no further focal symptoms or signs since admission.  Recurrent falls - Likely due to UTI complicating advanced age. No focal neurological deficits. PT recommends SNF. Clinical social worker consulted. - As per son, patient has not been ambulating for about 3 years. She moves around at home with the help of a wheelchair and is able to take a step or 2 off the wheelchair to move to bed or bedside commode. Recently slid off the bed to the floor without significant injuries. Last significant fall was in January 2016. - Patient will be discharged to SNF for rehabilitation.  Essential hypertension - Uncontrolled. Continue home dose of ARB and amlodipine 5 MG daily added. Monitor at SNF and make adjustments as deemed necessary.   Hyperlipidemia - Not on statins at home  Diet-controlled DM 2  Left lower chest wall pain - No fractures reported on chest x-ray  RUL pulmonary nodule 6 mm - Reported on portable chest x-ray. Outpatient evaluation as deemed necessary.   Discussed with son at bedside.   Consultants:  None  Procedures:  None  Antibiotics:  IV aztreonam 9/9 >  IV Rocephin 1 dose on 9/9   Discharge Exam:  Complaints: Denies complaints. As per son, mental status has almost returned to baseline.  Filed Vitals:   12/01/14 1358 12/01/14 2100 12/02/14 0523  12/02/14 0629  BP: 141/83 165/85 196/90 155/81  Pulse: 113 79 84 99  Temp: 97.7 F (36.5 C) 97.8 F (36.6 C) 97.6 F (36.4 C)   TempSrc: Oral Oral Oral   Resp: 20 18 18    Height:      Weight:      SpO2: 99% 98% 99%     General exam: Moderately built and frail elderly female lying comfortably propped up in bed Respiratory system: Clear. No increased work of breathing. Cardiovascular system: S1 & S2 heard, RRR. No JVD, murmurs, gallops, clicks or pedal edema.  Gastrointestinal system: Abdomen is nondistended, soft and nontender. Normal bowel sounds heard. Central nervous system: Alert and oriented 3. No focal neurological deficits. Extremities: Symmetric 5 x 5 power.  Discharge Instructions      Discharge Instructions    Call MD for:  difficulty breathing, headache or visual disturbances    Complete by:  As directed      Call MD for:  extreme fatigue    Complete by:  As directed      Call MD for:  hives    Complete by:  As directed      Call MD for:  persistant dizziness or light-headedness    Complete by:  As directed      Call MD for:  persistant nausea and vomiting    Complete by:  As directed      Call MD for:  severe uncontrolled pain    Complete by:  As directed      Call MD for:  temperature >100.4    Complete by:  As directed      Call MD for:    Complete by:  As directed   Worsening confusion.     Diet - low sodium heart healthy    Complete by:  As directed      Increase activity slowly    Complete by:  As directed             Medication List    TAKE these medications        amLODipine 5 MG tablet  Commonly known as:  NORVASC  Take 1 tablet (5 mg total) by mouth daily.     cephALEXin 500 MG capsule  Commonly known as:  KEFLEX  Take 1 capsule (500 mg total) by mouth 2 (two) times daily. Discontinue after 12/05/14 doses.     TYLENOL EX ST ARTHRITIS PAIN 500 MG tablet  Generic drug:  acetaminophen  Take 500-1,000 mg by mouth 2 (two) times daily as  needed for mild pain, moderate pain, fever or headache.     valsartan 80 MG tablet  Commonly known as:  DIOVAN  Take 80 mg by mouth 2 (two) times daily.          The results of significant diagnostics from this hospitalization (including imaging, microbiology, ancillary and laboratory) are listed below for reference.    Significant Diagnostic Studies: Dg Ankle Complete Left  2014-12-12   CLINICAL DATA:  73 year old female with fall and ankle pain.  EXAM: LEFT ANKLE COMPLETE - 3+ VIEW  COMPARISON:  None.  FINDINGS: No acute fracture or dislocation. The ankle mortise is intact. There is osteopenia with degenerative changes. The  soft tissues are unremarkable. No radiopaque foreign object. A 7 mm calcaneal spur.  IMPRESSION: No acute fracture or dislocation.   Electronically Signed   By: Elgie Collard M.D.   On: 11/29/2014 02:08   Ct Head Wo Contrast  11/29/2014   CLINICAL DATA:  Weakness, confusion, and recent falls.  EXAM: CT HEAD WITHOUT CONTRAST  TECHNIQUE: Contiguous axial images were obtained from the base of the skull through the vertex without intravenous contrast.  COMPARISON:  None.  FINDINGS: Mild diffuse cerebral atrophy. Patchy low-attenuation changes in the deep white matter consistent with small vessel ischemia. No mass effect or midline shift. No abnormal extra-axial fluid collections. Gray-white matter junctions are distinct. Basal cisterns are not effaced. No evidence of acute intracranial hemorrhage. No depressed skull fractures. Visualized paranasal sinuses and mastoid air cells are not opacified. Vascular calcifications.  IMPRESSION: No acute intracranial abnormalities. Chronic atrophy and small vessel ischemic changes.   Electronically Signed   By: Burman Nieves M.D.   On: 11/29/2014 00:01   Mr Brain Wo Contrast  12/01/2014   CLINICAL DATA:  74 year old diabetic hypertensive female with hyperlipidemia with confusion and dizziness and recent fall. Altered mental status for  6 days. Subsequent encounter.  EXAM: MRI HEAD WITHOUT CONTRAST  TECHNIQUE: Multiplanar, multiecho pulse sequences of the brain and surrounding structures were obtained without intravenous contrast.  COMPARISON:  11/28/2014 head CT.  No comparison brain MR  FINDINGS: No acute infarct.  No intracranial hemorrhage.  Small vessel disease type changes with possible remote infarct anterior limb left internal capsule versus result of small vessel disease.  Mild global atrophy without hydrocephalus.  No intracranial mass lesion noted on this unenhanced exam.  Major intracranial vascular structures are patent.  Minimal exophthalmos.  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  T2 bright structure right maxilla may represent an incidentally noted incisive canal cyst but is incompletely assessed on present exam.  Slightly lobulated accessory parotid tissue incidentally noted.  IMPRESSION: No acute infarct.  Small vessel disease type changes with possible remote infarct anterior limb left internal capsule versus result of small vessel disease.  Mild global atrophy without hydrocephalus.  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  T2 bright structure right maxilla may represent an incidentally noted incisive canal cyst but is incompletely assessed on present exam.   Electronically Signed   By: Lacy Duverney M.D.   On: 12/01/2014 15:54   Dg Chest Portable 1 View  11/29/2014   CLINICAL DATA:  74 year old female with leukocytosis  EXAM: PORTABLE CHEST - 1 VIEW  COMPARISON:  None.  FINDINGS: Single-view of the chest demonstrate mild emphysematous changes of the lungs. There is no focal consolidation, pleural effusion, or pneumothorax. A 6 mm nodular density noted in the right upper lung field projecting over the right anterior third rib. It is indeterminate whether this lesion is on the skin or in the lung on this single view study. PA and lateral projections may provide  better evaluation. Correlation with prior studies if available recommended to evaluate for interval change. If prior studies are not available CT is recommended for better evaluation of the lungs.  The aorta is tortuous. Mild cardiomegaly. No pleural effusion or pneumothorax. The osseous structures are grossly unremarkable.  IMPRESSION: A 6 mm nodule in the right upper lung field as described. Comparison with prior studies if available or further evaluation with CT is recommended.   Electronically Signed   By: Ceasar Mons.D.  On: 11/29/2014 03:42    Microbiology: Recent Results (from the past 240 hour(s))  Urine culture     Status: None   Collection Time: 11/29/14  1:12 AM  Result Value Ref Range Status   Specimen Description URINE, RANDOM  Final   Special Requests NONE  Final   Culture   Final    >=100,000 COLONIES/mL ESCHERICHIA COLI Performed at [facility]    Report Status 12/02/2014 FINAL  Final   Organism ID, Bacteria ESCHERICHIA COLI  Final      Susceptibility   Escherichia coli - MIC*    AMPICILLIN 4 SENSITIVE Sensitive     CEFAZOLIN <=4 SENSITIVE Sensitive     CEFTRIAXONE <=1 SENSITIVE Sensitive     CIPROFLOXACIN <=0.25 SENSITIVE Sensitive     GENTAMICIN <=1 SENSITIVE Sensitive     IMIPENEM <=0.25 SENSITIVE Sensitive     NITROFURANTOIN <=16 SENSITIVE Sensitive     TRIMETH/SULFA <=20 SENSITIVE Sensitive     AMPICILLIN/SULBACTAM 4 SENSITIVE Sensitive     PIP/TAZO <=4 SENSITIVE Sensitive     * >=100,000 COLONIES/mL ESCHERICHIA COLI  Blood culture (routine x 2)     Status: None (Preliminary result)   Collection Time: 11/29/14  2:15 AM  Result Value Ref Range Status   Specimen Description BLOOD BLOOD LEFT HAND  Final   Special Requests BOTTLES DRAWN AEROBIC AND ANAEROBIC 5.5ML  Final   Culture   Final    NO GROWTH 2 DAYS Performed at [facility]    Report Status PENDING  Incomplete  Blood culture (routine x 2)     Status: None (Preliminary result)    Collection Time: 11/29/14  2:25 AM  Result Value Ref Range Status   Specimen Description BLOOD BLOOD RIGHT HAND  Final   Special Requests IN PEDIATRIC BOTTLE  Final   Culture   Final    NO GROWTH 2 DAYS Performed at [facility]    Report Status PENDING  Incomplete     Labs: Basic Metabolic Panel:  Recent Labs Lab 11/28/14 2342 11/29/14 0340  NA 141 141  K 3.6 3.5  CL 105 104  CO2 26 28  GLUCOSE 156* 140*  BUN 27* 25*  CREATININE 0.86 0.79  CALCIUM 10.3 9.7   Liver Function Tests:  Recent Labs Lab 11/28/14 2342  AST 17  ALT 11*  ALKPHOS 70  BILITOT 1.1  PROT 7.5  ALBUMIN 3.7   No results for input(s): LIPASE, AMYLASE in the last 168 hours. No results for input(s): AMMONIA in the last 168 hours. CBC:  Recent Labs Lab 11/28/14 2342 11/29/14 0340  WBC 12.1* 9.8  NEUTROABS 10.0*  --   HGB 13.7 12.7  HCT 42.1 40.0  MCV 97.2 97.8  PLT 298 296   Cardiac Enzymes: No results for input(s): CKTOTAL, CKMB, CKMBINDEX, TROPONINI in the last 168 hours. BNP: BNP (last 3 results) No results for input(s): BNP in the last 8760 hours.  ProBNP (last 3 results) No results for input(s): PROBNP in the last 8760 hours.  CBG:  Recent Labs Lab 11/29/14 0729 11/29/14 1102 11/30/14 0722 12/01/14 0735 12/02/14 0732  GLUCAP 114* 137* 113* 81 82     Additional labs: 1. A1c: 5.7 2. Lipid panel: Cholesterol 218, triglycerides 80, HDL 62, VLDL 16 and LDL 140.   Signed:  Marcellus Scott, MD, FACP, FHM. [facility] Pager [phone]  If 7PM-7AM, please contact night-coverage [email or web address] Password [email or web address] 12/02/2014, 1:14 PM

## 2014-12-02 NOTE — Progress Notes (Signed)
D/c to SNF when transport available

## 2014-12-03 ENCOUNTER — Non-Acute Institutional Stay: Payer: Medicare Other | Admitting: Family Medicine

## 2014-12-03 DIAGNOSIS — E119 Type 2 diabetes mellitus without complications: Secondary | ICD-10-CM

## 2014-12-03 DIAGNOSIS — Z593 Problems related to living in residential institution: Secondary | ICD-10-CM

## 2014-12-03 DIAGNOSIS — F409 Phobic anxiety disorder, unspecified: Secondary | ICD-10-CM

## 2014-12-03 DIAGNOSIS — Z789 Other specified health status: Secondary | ICD-10-CM

## 2014-12-03 DIAGNOSIS — N390 Urinary tract infection, site not specified: Secondary | ICD-10-CM

## 2014-12-03 DIAGNOSIS — I10 Essential (primary) hypertension: Secondary | ICD-10-CM | POA: Diagnosis not present

## 2014-12-03 HISTORY — DX: Problems related to living in residential institution: Z59.3

## 2014-12-03 HISTORY — DX: Other specified health status: Z78.9

## 2014-12-03 NOTE — Progress Notes (Signed)
[facility]  Visit  Primary Care Provider: Gaye Alken, MD Location of Care: [facility] Visit Information: New admission Patient accompanied by self Source(s) of information for visit: patient and past medical records  Chief Complaint: No chief complaint on file.   Nursing Concerns: None at the time  Nutrition Concerns: None at the time  Wound Care Nurse Concerns: None at the time  PT Concerns and Goals: Concerns None yet  OT Concerns and Goals: None yet   Family Goals: None yet   If SNF admission, discharge disposition goals:  be placed in nursing home   HISTORY OF PRESENT ILLNESS: Outpatient Encounter Prescriptions as of 12/03/2014  Medication Sig  . acetaminophen (TYLENOL EX ST ARTHRITIS PAIN) 500 MG tablet Take 500-1,000 mg by mouth 2 (two) times daily as needed for mild pain, moderate pain, fever or headache.   Marland Kitchen amLODipine (NORVASC) 5 MG tablet Take 1 tablet (5 mg total) by mouth daily.  . cephALEXin (KEFLEX) 500 MG capsule Take 1 capsule (500 mg total) by mouth 2 (two) times daily. Discontinue after 12/05/14 doses.  . valsartan (DIOVAN) 80 MG tablet Take 80 mg by mouth 2 (two) times daily.    No facility-administered encounter medications on file as of 12/03/2014.   Allergies  Allergen Reactions  . Aspirin Other (See Comments)    Bleeding   . Latex Itching  . Penicillins Rash    Has patient had a PCN reaction causing immediate rash, facial/tongue/throat swelling, SOB or lightheadedness with hypotension: No Has patient had a PCN reaction causing severe rash involving mucus membranes or skin necrosis: No Has patient had a PCN reaction that required hospitalization No Has patient had a PCN reaction occurring within the last 10 years: No If all of the above answers are "NO", then may proceed with Cephalosporin use.    History Patient Active Problem List   Diagnosis Date Noted  . Diabetes mellitus without complication 11/29/2014   . Sepsis 11/29/2014  . Acute encephalopathy 11/29/2014  . UTI (lower urinary tract infection) 11/29/2014  . Hypertension   . Hyperlipemia   . Essential hypertension   . Fall    Past Medical History  Diagnosis Date  . Hypertension   . Diabetes mellitus   . Obesity   . Hyperlipemia   . Osteoarthritis   . Gout   . Depression   . Hemorrhoid   . Iron deficiency anemia secondary to blood loss (chronic)    Past Surgical History  Procedure Laterality Date  . Knee surgery Right    Family History  Problem Relation Age of Onset  . Heart attack Mother   . Diabetes Father     reports that she has quit smoking. She does not have any smokeless tobacco history on file.  Basic Activities of Daily Living   ADLs Independent Needs Assistance Dependent  Bathing  X   Dressing X    Ambulation  X   Toileting X    Eating X       Instrumental Activities of Daily Living  IADL Independent Needs Assistance Dependent  Cooking  X   Housework  X   Manage Medications  X   Manage the telephone X    Shopping for food, clothes, Meds, etc   X  Use transportation   X  Manage Finances  X     Falls in the past six months:   yes  Diet:  general  Nourishment: No nutritional deficiency  Nutritional Supplements:  Medpass: no  Magic Cup:no  Prostat:no  Juven:no    Review of Systems  Patient has ability to communicate answers to ROS: yes See HPI  Geriatric Syndromes: Constipation no ,  Incontinence yes  Dizziness no   Syncope no   Skin problems no   Visual Impairment yes   Hearing impairment yes  Eating impairment no  Impaired Memory or Cognition yes   Behavioral problems no   Sleep problems no   Weight loss no    Pain:  Pain Location: Left foot, bilateral knees, back Pain Rating: She is currently in no pain.  Pain Duration: Days to chronic Pain Therapies: Tylenol Pain Response to Therapies: Yes Bowel Movement Difficulty: no  Dyspnea: Dyspnea Rating: None Dyspnea  Goal: none Dyspnea Therapies: N/a Dyspnea Response to Therapies: n/a   General: Denies fevers, chills, progressive fatigue, weight gain.  Eyes: Denies pain, blurred vision  Ears/Nose/Throat: Denies ear pain, throat pain, rhinorrhea, nasal congestion.  Cardiovascular: Denies chest pains, palpitations, dyspnea on exertion, orthopnea, peripheral edema.  Respiratory: Denies cough, sputum, dyspnea  Gastrointestinal: Denies abdominal pain, bloating, constipation, diarrhea.  Genitourinary: Denies dysuria, urinary frequency, discharge Musculoskeletal: Endorses some bilateral knee and left foot pain  Skin: Denies skin rash or ulcers. Neurologic: Denies transient paralysis, weakness, paresthesias, headache.  Psychiatric: Denies depression, anxiety, psychosis. Endocrine: Denies weight loss   PHYSICAL EXAM:. Wt Readings from Last 3 Encounters:  11/29/14 147 lb 14.9 oz (67.1 kg)  12/01/14 147 lb (66.679 kg)  11/16/10 241 lb 6.4 oz (109.498 kg)   Temp Readings from Last 3 Encounters:  12/02/14 97.8 F (36.6 C) Oral  05/25/11 98.3 F (36.8 C) Oral  11/16/10 97.1 F (36.2 C)    BP Readings from Last 3 Encounters:  12/02/14 164/78  05/25/11 151/90  11/16/10 141/78   Pulse Readings from Last 3 Encounters:  12/02/14 89  05/25/11 93  11/16/10 98    General: alert, cooperative, no distress, well nourished, pleasant, clean, groomed HEENT:  No scleral icterus, no nasal secretions, Oromucosa moist and no erythema or lesion Neck:  Supple, No JVD, no lymphadenopathy CV:  RRR, no murmur, no ankle swelling RESP: No resp distress or accessory muscle use.  Clear to ausc bilat. No wheezing, no rales, no rhonchi.  ABD:  Soft, Non-tender, non-distended, +bowel sounds, no masses MSK:  No back pain, no joint pain.  No joint swelling or redness EXT: Warm and well perfused   no edema, no erythema, pulses WNL, left foot with tenderness of third MTP joint. Patient with bilateral knee deformities. Gait:   Not tested and Transfers only, otherwise uses a wheel chair Skin: No visible rashes Neurologic:Cranial nerves normal;  Muscle Tone within normal limits; Motor Strength: weakness of bilateral arms and legs with about 3/5 strength in all extremities Sensation: WFL by patient report; Cerebellar: no tremors noted Psych:  Orientation oriented to person, place, time, and general circumstances; Judgment Good, Insight Intact Memory recent and remote memory intact; Attention Normal;  Mood appropriate; Speech normal; Language hearing disability ; Thought Coherent    No flowsheet data found. No flowsheet data found.  MiniCog: Fail MMSE or MoCa:  29 / 30  Assessment and Plan:   See Problem List for individual problem's assessment and plans.   Family communications: Son [name]): [phone]   Advanced Directives (MOST form, Living Will, HCPOA): None at this time Code Status: Full Code Intubation Status: Intubate prn Intravenous Fluids: Yes Feeding Tubes: Yes Antibiotics: Yes Hospitalization: Yes Emergency contact:  Son [phone]   Follow  Up:  Next 7 days unless acute issues arise.

## 2014-12-03 NOTE — Assessment & Plan Note (Addendum)
Patient's blood pressure uncontrolled. Discharged on Diovan  BID and amlodipine  daily. Will monitor blood pressures while at nursing home. May need to adjust blood pressure medications. Currently not at max dosage of Diovan or amlodipine.

## 2014-12-03 NOTE — Assessment & Plan Note (Signed)
Patient being treated for UTI, although, appears to have been asymptomatic bacteruria. Patient currently completing course with last doses on 9/15. Will continue until completion.

## 2014-12-03 NOTE — Assessment & Plan Note (Signed)
Patient's last A1C of 5.7. Currently diet controlled. Will monitor CBGs very infrequently. Plan to start CBG checks qweekly. SSI per nursing home default protocol. A1C in 6 months.

## 2014-12-04 LAB — CULTURE, BLOOD (ROUTINE X 2)
Culture: NO GROWTH
Culture: NO GROWTH

## 2014-12-05 ENCOUNTER — Encounter: Payer: Self-pay | Admitting: Family Medicine

## 2014-12-05 DIAGNOSIS — F409 Phobic anxiety disorder, unspecified: Secondary | ICD-10-CM

## 2014-12-05 HISTORY — DX: Phobic anxiety disorder, unspecified: F40.9

## 2014-12-05 NOTE — Progress Notes (Signed)
Patient ID: [name], female   DOB: Dec 02, 1940, 74 y.o.   MRN: [id number] I have seen and examined this patient. I have reviewed labs and imaging results.  I have discussed with Dr [name].  I agree with their findings and plans as documented in their SNF admission note.  HPI [name] is 74 year old white female admitted to [facility] from 9/9 to 9/13/16for altered mental status secondary to Urinary Tract Infection  Patient needs assistance with bed mobility.  PT and OT evaluations in hospital found upper and lower extremity generalized weakness. decreased strength, decreased activity tolerance, decreased balance, decreased mobility.  Pt has fear of falling that inhibits her attempting to get out of bed. Patient has impaired ability to perform Self-care ADls, use of DME.  Recommended functional mobility training, therapeutic activities, therapeutic exercise and balance training.  Potential for rehab judged good by inhouse PT.  Blood cultures  Patient anticipates staying on at [facility] for long-term care.   Patient will need a CT with contrast of chest to follow up 6 mm nodule seen on pCXR 11/29/14.

## 2014-12-17 ENCOUNTER — Other Ambulatory Visit: Payer: Self-pay | Admitting: Family Medicine

## 2014-12-17 DIAGNOSIS — R911 Solitary pulmonary nodule: Secondary | ICD-10-CM

## 2014-12-19 ENCOUNTER — Ambulatory Visit (HOSPITAL_COMMUNITY): Payer: Medicare Other

## 2014-12-29 ENCOUNTER — Encounter: Payer: Self-pay | Admitting: Family Medicine

## 2014-12-31 NOTE — Progress Notes (Signed)
This encounter was created in error - please disregard.

## 2015-01-06 ENCOUNTER — Encounter: Payer: Self-pay | Admitting: Family Medicine

## 2015-01-07 ENCOUNTER — Encounter: Payer: Self-pay | Admitting: Family Medicine

## 2015-01-07 ENCOUNTER — Non-Acute Institutional Stay: Payer: Medicare Other | Admitting: Family Medicine

## 2015-01-07 DIAGNOSIS — M7752 Other enthesopathy of left foot: Secondary | ICD-10-CM | POA: Insufficient documentation

## 2015-01-07 DIAGNOSIS — E119 Type 2 diabetes mellitus without complications: Secondary | ICD-10-CM

## 2015-01-07 DIAGNOSIS — F409 Phobic anxiety disorder, unspecified: Secondary | ICD-10-CM

## 2015-01-07 DIAGNOSIS — M779 Enthesopathy, unspecified: Secondary | ICD-10-CM

## 2015-01-07 DIAGNOSIS — I10 Essential (primary) hypertension: Secondary | ICD-10-CM

## 2015-01-07 DIAGNOSIS — R911 Solitary pulmonary nodule: Secondary | ICD-10-CM

## 2015-01-07 HISTORY — DX: Solitary pulmonary nodule: R91.1

## 2015-01-07 NOTE — Progress Notes (Signed)
   Subjective:    Patient ID: [name], female    DOB: 1940-12-25, 74 y.o.   MRN: [id number]  HPI  Problem List Items Addressed This Visit      Fear of Falling - Over 3 months duration - Paticipating in physical and occupational therapy - Using WC and walker.  [facility]  Pain with standing in left foot - No falls since admission. No numbness in feet   Essential hypertension  Disease Monitoring  Blood pressure range: 130's/70s  Chest pain: no   Dyspnea: no   Claudication: no   Medication compliance: yes  Medication Side Effects  Lightheadedness: no   Urinary frequency: no   Edema: no   Tinnitus with taking Amlodipine  Preventitive Healthcare:  Exercise: yes, with PT   Diet Pattern: low salt diet.        Diabetes mellitus without complication (HCC) - Long standing problem - diet controlled. - No change in vision. No weakness in limbs or face.    Bone spur of left foot - New problem - Dx by visiting podiatrist with foot XR.  7 mm heel spur - Patient wrapping heel in ace bandage that helps with discomfort with standing.  - no numbness or tingling in foot.      No smoking.  Previously a smoker.    Review of Systems (+) weight gain (+) no orthopnea    Objective:   Physical Exam VS reviewed.  Discussed with nursing staff how pt has been doing.  GEN: Alert, Cooperative, Groomed, NAD HEENT: NCAT, no cervial LAN, no thyromaegaly COR: RRR, No M/G/R, No JVD,  LUNGS: BCTA, No Acc mm use, speaking in full sentences ABDOMEN: (+)BS, soft, NT, ND EXT: No peripheral leg edema. Feet without deformity or lesions. Palpable bilateral pedal pulses.  Neuro: Oriented to person, place, and time Ambulation: able to propel WC with use of both legs and arms.  Able to steer without difficulty. Psych: Normal affect/thought/speech/language        Assessment & Plan:

## 2015-01-07 NOTE — Assessment & Plan Note (Signed)
Established problem Stable. Adequate control with amlodipine and valsartan. Pt reporting tinnitus with taking amlodipine.  She would like to try a trial off amlodipine.  She is willing to go back on it if BP is inadequately controlled.  Amlodipine stopped.  Daily BP measurement for next week.  Will reassess adequatcy of BP control next week.

## 2015-01-07 NOTE — Assessment & Plan Note (Signed)
Adequate glycemic control.  Pt is tolerating the current dietary regiment of LCS diet regiment. Continue current treatment plan.   Diabetes Prevention             Daily Aspirin: need to discuss daily aspirin with patient              Statin: do not need given age and lack of ASCVD (suspect DMT2 is not significant)             Dental evaluation in 536-months: needs dental assessment in NH or at her community dentist                        ACEI: On ARB  Eye Exam: Need to see when last done.   Foot Exam: done today  Diet pattern: No conc sugars, Low salt diet.

## 2015-01-07 NOTE — Assessment & Plan Note (Signed)
New problem Management per visiting podiatry Pt wants to use ACE wrap as padding for ankle/heel when she goes for PT.  ACE wrap prescribed.

## 2015-01-07 NOTE — Assessment & Plan Note (Signed)
Established problem Slightly improved Continue PT and OT

## 2015-01-07 NOTE — Assessment & Plan Note (Addendum)
Established problem CXR with 6 mm nodule reviewed from 08/29/14. RUL field.  Smooth borders. No calcification.  No prior films for comparison.  Patient declined further imaging stating that if we found that it was cancer she would not want to do anything about it.   Her MMSE 29/30 on admission.  Planned Chest CT canceled per patient request.

## 2015-02-17 ENCOUNTER — Encounter: Payer: Self-pay | Admitting: Pharmacist

## 2015-02-20 ENCOUNTER — Non-Acute Institutional Stay (INDEPENDENT_AMBULATORY_CARE_PROVIDER_SITE_OTHER): Payer: Medicare Other | Admitting: Family Medicine

## 2015-02-20 DIAGNOSIS — R0602 Shortness of breath: Secondary | ICD-10-CM

## 2015-02-20 MED ORDER — ALBUTEROL SULFATE HFA 108 (90 BASE) MCG/ACT IN AERS: 2.0000 | INHALATION_SPRAY | Freq: Four times a day (QID) | RESPIRATORY_TRACT | Status: AC | PRN

## 2015-02-20 NOTE — Progress Notes (Signed)
   Subjective:    Patient ID: [name], female    DOB: [birth year], 74 y.o.   MRN: [id number]  [facility]  HPI  CC: short of breath  # Shortness of breath:  Complains of shortness of breath intermittently  Can't say what brings it on, seems to go away on its own  She mostly stays in bed, so cannot say if it is exertional  No cough but has woken up with a lot of phlegm in her mouth  She says that she has been told in the past she has a large heart and thought she was diagnosed with heart failure ROS: no fevers, no reported weight increase  Social Hx: former smoke exposure, denies ever smoking consistently  Review of Systems   See HPI for ROS.     Medication List       This list is accurate as of: 02/20/15  4:17 PM.  Always use your most recent med list.               TYLENOL EX ST ARTHRITIS PAIN 500 MG tablet  Generic drug:  acetaminophen  Take 500-1,000 mg by mouth 2 (two) times daily as needed for mild pain, moderate pain, fever or headache.     valsartan 80 MG tablet  Commonly known as:  DIOVAN  Take 80 mg by mouth 2 (two) times daily.         Objective:   General: NAD, pleasant 74yo female CV: RRR, did not appreciate any murmurs Resp: there are wheezes noted on the right middle/upper lobes, effort is normal. No focal crackles Extremities: trace LE edema Neuro: alert   Assessment & Plan:  1. Shortness of breath DDx includes COPD (with prior smoke exposure), CHF (this is based on her report only, I do not see prior diagnoses of this, nor an echocardiogram), lung cancer (with solitary nodule seen ~3 months ago, she declined CT scan at that time), less likely infectious pneumonia without fever/cough.  - trial of albuterol inhaler (pt requests at bedside, I informed her that she would be evaluated on Monday to make sure it would be safe to keep at bedside) - [facility] (so radiologist can compare September  x-ray) - continue weekly weight checks to evaluate for fluid retention - will follow up with her next week  Tawni CarnesAndrew Amaziah Raisanen, MD 02/20/2015, 4:26 PM PGY-3, [facility]

## 2015-02-25 NOTE — Progress Notes (Signed)
Patient ID: [name], female   DOB: [birth year], 74 y.o.   MRN: [id number] I discussed the patient's case with  Dr. Waynetta SandyWight. I agree with their plans documented in their acute visit note.

## 2015-03-04 ENCOUNTER — Other Ambulatory Visit: Payer: Self-pay | Admitting: Family Medicine

## 2015-03-04 ENCOUNTER — Ambulatory Visit (HOSPITAL_COMMUNITY)
Admission: RE | Admit: 2015-03-04 | Discharge: 2015-03-04 | Disposition: A | Discharge status: Home / Self Care | Payer: Medicare Other | Source: Ambulatory Visit | Attending: Family Medicine | Admitting: Family Medicine

## 2015-03-04 DIAGNOSIS — J449 Chronic obstructive pulmonary disease, unspecified: Secondary | ICD-10-CM | POA: Insufficient documentation

## 2015-03-04 DIAGNOSIS — R911 Solitary pulmonary nodule: Secondary | ICD-10-CM | POA: Insufficient documentation

## 2015-03-04 DIAGNOSIS — R079 Chest pain, unspecified: Secondary | ICD-10-CM

## 2015-03-09 ENCOUNTER — Encounter: Payer: Self-pay | Admitting: *Deleted

## 2015-03-09 NOTE — Progress Notes (Signed)
[facility] need for [facility] for her 2D Echo.  [facility].  [facility] and order # [id number] received and valid from 03/09/15-04/07/15.  Called Enrique SackKendra back and made her aware of this. Jazmin Hartsell,CMA

## 2015-03-10 ENCOUNTER — Ambulatory Visit (HOSPITAL_COMMUNITY)
Admission: RE | Admit: 2015-03-10 | Discharge: 2015-03-10 | Disposition: A | Discharge status: Home / Self Care | Payer: Medicare Other | Source: Ambulatory Visit | Attending: Family Medicine | Admitting: Family Medicine

## 2015-03-10 DIAGNOSIS — I517 Cardiomegaly: Secondary | ICD-10-CM | POA: Insufficient documentation

## 2015-03-10 DIAGNOSIS — R079 Chest pain, unspecified: Secondary | ICD-10-CM

## 2015-03-10 DIAGNOSIS — Z6832 Body mass index (BMI) 32.0-32.9, adult: Secondary | ICD-10-CM | POA: Diagnosis not present

## 2015-03-10 DIAGNOSIS — I10 Essential (primary) hypertension: Secondary | ICD-10-CM | POA: Insufficient documentation

## 2015-03-10 DIAGNOSIS — E669 Obesity, unspecified: Secondary | ICD-10-CM | POA: Diagnosis not present

## 2015-03-10 DIAGNOSIS — Z87891 Personal history of nicotine dependence: Secondary | ICD-10-CM | POA: Diagnosis not present

## 2015-03-10 DIAGNOSIS — E119 Type 2 diabetes mellitus without complications: Secondary | ICD-10-CM | POA: Diagnosis not present

## 2015-03-10 DIAGNOSIS — I351 Nonrheumatic aortic (valve) insufficiency: Secondary | ICD-10-CM | POA: Diagnosis not present

## 2015-03-10 NOTE — Progress Notes (Signed)
Echocardiogram 2D Echocardiogram has been performed.  Nolon RodBrown, Tony 03/10/2015, 1:23 PM

## 2015-03-11 ENCOUNTER — Non-Acute Institutional Stay: Payer: Medicare Other | Admitting: Family Medicine

## 2015-03-11 DIAGNOSIS — I10 Essential (primary) hypertension: Secondary | ICD-10-CM | POA: Diagnosis not present

## 2015-03-11 DIAGNOSIS — M779 Enthesopathy, unspecified: Secondary | ICD-10-CM

## 2015-03-11 DIAGNOSIS — R911 Solitary pulmonary nodule: Secondary | ICD-10-CM

## 2015-03-11 DIAGNOSIS — E119 Type 2 diabetes mellitus without complications: Secondary | ICD-10-CM | POA: Diagnosis not present

## 2015-03-11 DIAGNOSIS — F409 Phobic anxiety disorder, unspecified: Secondary | ICD-10-CM

## 2015-03-11 DIAGNOSIS — M7752 Other enthesopathy of left foot: Secondary | ICD-10-CM

## 2015-03-11 DIAGNOSIS — M1990 Unspecified osteoarthritis, unspecified site: Secondary | ICD-10-CM

## 2015-03-11 DIAGNOSIS — D509 Iron deficiency anemia, unspecified: Secondary | ICD-10-CM

## 2015-03-11 DIAGNOSIS — D649 Anemia, unspecified: Secondary | ICD-10-CM | POA: Insufficient documentation

## 2015-03-11 HISTORY — DX: Anemia, unspecified: D64.9

## 2015-03-11 NOTE — Assessment & Plan Note (Signed)
Patient with a reported history of osteoarthritis. Notes pain in back is normally controlled by extended release Tylenol but notes sometimes there is break through due to going 10 hours.  - Tylenol CR 650mg  at 2 am and 10am, 1300mg  at 6pm.

## 2015-03-11 NOTE — Assessment & Plan Note (Signed)
Adequate glycemic control currently without pharmacologic intervention. Patient declines ASA use given h/o bleeding. Not on a statin due to age/ASCVD score. Currently on an ARB- patient asked if she could be taken off this given her echo was normal and we dicussed improvement in M&M with ACE or ARB. CBGs have ranged 108-148. - continue to avoid concentrated sugars - CBGs weekly

## 2015-03-11 NOTE — Assessment & Plan Note (Signed)
Established problem. Patient with SOB last week that has now resolved. 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  - no CT per patient requests previously, however f/u CXR re-assuring.

## 2015-03-11 NOTE — Assessment & Plan Note (Signed)
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 Appears she's had a history of rectal bleeding in the past, none recently. Was seeing Dr. Clelia CroftShadad with hematology. Hemoglobin has been stable. - continue to monitor hemoglobin q6 months, if declining consider f/u back with Dr. Clelia CroftShadad.

## 2015-03-11 NOTE — Assessment & Plan Note (Signed)
Patient currently not having BPs checked regularly. Tinnitus has resolved off amlodipine. - continue valsartan 80mg  daily  - orders placed to check vitals (HR and BP) weekly.

## 2015-03-11 NOTE — Progress Notes (Signed)
Patient ID: [name], female   DOB: [birth year], 74 y.o.   MRN: [id number] [facility]  Visit  Primary Care Provider: MCDIARMID,TODD D, MD Location of Care: [facility] Visit Information: a scheduled routine follow-up visit Patient accompanied by no one Source(s) of information for visit: patient and EMR   Chief Complaint: Pain  Patient endorses significant pain in her back from OA. Notes that she's taking extended release Tylenol every 8 hours, but because the +/- hour for the administration, she sometimes goes 10 hours without something for pain and it becomes bad. She notes one night an RN gave her 2 tablets instead of 1 and she felt like it worked significantly better, despite having more than 8 hours inbetween doses. Pain pain in lower in nature, dull and aching. No radiation. She also has pain between her shoulder blades sometimes after doing her elastic band exercises. She requests taking 2 tablets q8hrs but notes that her pain is most severe between 6pm-2am.  Additionally, patient recently seen due to SOB. Notes SOB has resolved. No cough, fevers. CXR with mild hyperinflation and stable calcified granuloma in RUL measuring 6mm. TTE difficult however noted mild LVH, EF 60-65%, unable to assess diastolic function, possible small posterior pericardial effusion.  She notes a h/o emphysema but states she NEVER smoked despite what history notes, she did have a lot of passive smoke exposure though.  Nutrition Concerns: Scales not accurate initially, initially concerns for underweight until more accurate weights obtained. Soft mechanical diet due to dentures and poor dentition. Consumes 75-100% of her meals. Per pt, RD wanted her to be weighed 4 times a month but patient declined noting she did not want to have to get up that often as it is very difficult.   Wound Care Nurse Concerns: none  PT Concerns and Goals: Patient normally works with PT twice per week on AROM and  transfers. She notes she does exercises on her own when PT doesn't come to see her. She still dislikes having to transfer and get out of bed.    HISTORY OF PRESENT ILLNESS: Outpatient Encounter Prescriptions as of 03/11/2015  Medication Sig  . acetaminophen (TYLENOL) 650 MG CR tablet Take 650 mg by mouth every 8 (eight) hours as needed for pain.  . magnesium hydroxide (MILK OF MAGNESIA) 800 MG/5ML suspension Take by mouth daily as needed for constipation.  Marland Kitchen albuterol (PROVENTIL HFA;VENTOLIN HFA) 108 (90 BASE) MCG/ACT inhaler Inhale 2 puffs into the lungs every 6 (six) hours as needed for wheezing or shortness of breath.  . valsartan (DIOVAN) 80 MG tablet Take 80 mg by mouth 2 (two) times daily.   . [DISCONTINUED] acetaminophen (TYLENOL EX ST ARTHRITIS PAIN) 500 MG tablet Take 500-1,000 mg by mouth 2 (two) times daily as needed for mild pain, moderate pain, fever or headache.    No facility-administered encounter medications on file as of 03/11/2015.   Allergies  Allergen Reactions  . Aspirin Other (See Comments)    Bleeding   . Latex Itching  . Penicillins Rash    Has patient had a PCN reaction causing immediate rash, facial/tongue/throat swelling, SOB or lightheadedness with hypotension: No Has patient had a PCN reaction causing severe rash involving mucus membranes or skin necrosis: No Has patient had a PCN reaction that required hospitalization No Has patient had a PCN reaction occurring within the last 10 years: No If all of the above answers are "NO", then may proceed with Cephalosporin use.    History Patient  Active Problem List   Diagnosis Date Noted  . Osteoarthritis 03/11/2015  . Anemia 03/11/2015  . Bone spur of left foot   . 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 Person living in residential institution 12/03/2014  . Diabetes mellitus without complication (HCC) 11/29/2014  . Hyperlipemia   . Essential hypertension   . Fall   . Solitary pulmonary nodule, possible 08/29/2014    Past Medical History  Diagnosis Date  . Hypertension   . Diabetes mellitus   . Obesity   . Hyperlipemia   . Osteoarthritis   . Gout   . Depression   . Hemorrhoid   . Iron deficiency anemia secondary to blood loss (chronic)   . 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 UTI (lower urinary tract infection) 11/29/2014  . Essential hypertension   . Diabetes mellitus without complication (HCC) 11/29/2014  . Person living in residential institution 12/03/2014  . Fall   . Bone spur of left foot   . Solitary pulmonary nodule 01/07/2015   Past Surgical History  Procedure Laterality Date  . Knee surgery Right    Family History  Problem Relation Age of Onset  . Heart attack Mother   . Diabetes Father     reports that she has quit smoking. She does not have any smokeless tobacco history on file.    Diet:  mechanical soft  Review of Systems  Patient has ability to communicate answers to ROS: yes See HPI  Geriatric Syndromes: Constipation no ,   Incontinence yes, wears adult diapers Dizziness no   Syncope no   Skin problems no   Visual Impairment no   Hearing impairment yes  Eating impairment yes- dentures, poor dentition Impaired Memory or Cognition no   Behavioral problems no   Sleep problems no   Weight loss no    Pain:  Pain Location: Back, left shoulder, right shoulder Pain Rating: She is currently in no pain.  Pain Duration: chronic Pain Therapies: Tylenol Pain Response to Therapies: yes, as above in HPI Bowel Movement Difficulty: no  Dyspnea: Dyspnea Rating: denies currently Dyspnea Goal: none Dyspnea Therapies: albuterol PRN Dyspnea Response to Therapies: good   General: Denies fevers, chills, progressive fatigue, weight gain.  Eyes: Denies pain, blurred vision  Ears/Nose/Throat: Denies ear pain, throat pain, rhinorrhea, nasal congestion.  Cardiovascular: Denies chest pains, palpitations, dyspnea on exertion, orthopnea, peripheral edema.  Respiratory: Denies cough,  sputum, dyspnea  Gastrointestinal: Denies abdominal pain, bloating, constipation, diarrhea.  Genitourinary: Denies dysuria, urinary frequency, discharge Musculoskeletal: Denies joint pain, swelling, weakness.  Skin: Denies skin rash or ulcers. Neurologic: Denies transient paralysis, weakness, paresthesias, headache.  Psychiatric: Denies depression, anxiety, psychosis. Endocrine: Denies weight loss   PHYSICAL EXAM:. Wt Readings from Last 3 Encounters:  11/29/14 147 lb 14.9 oz (67.1 kg)  12/01/14 147 lb (66.679 kg)  11/16/10 241 lb 6.4 oz (109.498 kg)   Temp Readings from Last 3 Encounters:  12/02/14 97.8 F (36.6 C) Oral  05/25/11 98.3 F (36.8 C) Oral  11/16/10 97.1 F (36.2 C)    BP Readings from Last 3 Encounters:  12/02/14 164/78  05/25/11 151/90  11/16/10 141/78   Pulse Readings from Last 3 Encounters:  12/02/14 89  05/25/11 93  11/16/10 98    General: alert, cooperative, very talkative, well nourished, pleasant, clean, groomed HEENT:  No scleral icterus, no nasal secretions, Oromucosa moist and no erythema or lesion. Dentures on top, poor dentition on bottom.  Neck:  Supple, No JVD, no lymphadenopathy CV:  RRR,  no murmur, no ankle swelling RESP: No resp distress or accessory muscle use.  Clear to ausc bilat. No wheezing, no rales, no rhonchi.  ABD:  Soft, Non-tender, non-distended, +bowel sounds MSK:  No back pain, no joint pain.  Swelling of the knees bilaterally without erythema.  EXT: Warm and well perfused   no edema, no erythema, pulses WNL and trace edema Gait:  Not tested Skin:No rashes or lesions noted. Neurologic:Cranial nerves normal besides decreased hearing;  Muscle Tone within normal limits and atrophy; Motor Strength: generalized weakness, can lift legs against gravity.  Psych:  Orientation oriented to person, place, time, and general circumstances; Judgment Good, Insight Intact Memory recent and remote memory intact; Attention Normal;  Mood  appropriate; Speech normal; Language none ; Thought Coherent and Relevant  CXR with mild hyperinflation and stable calcified granuloma in RUL measuring 6mm.  TTE difficult however noted mild LVH, EF 60-65%, unable to assess diastolic function, possible small posterior pericardial effusion.    Assessment and Plan:   See Problem List for individual problem's assessment and plans.    Code Status:     FULL CODE

## 2015-03-11 NOTE — Assessment & Plan Note (Signed)
Stable. Continue ACE wrap.

## 2015-03-11 NOTE — Assessment & Plan Note (Signed)
Established problem. No falls recently. Still hesitant to transfer. Working with PT.

## 2015-03-26 ENCOUNTER — Encounter: Payer: Self-pay | Admitting: Pharmacist

## 2015-04-15 MED ORDER — LOSARTAN POTASSIUM 50 MG PO TABS: 50.0000 mg | ORAL_TABLET | Freq: Every day | ORAL | Status: DC

## 2015-04-15 NOTE — Progress Notes (Signed)
FMTS Attending Note  I personally saw and evaluated the patient. The plan of care was discussed with the resident team. I agree with the assessment and plan as documented by the resident.   Patient reports the recent death of her daughter on April 29, 2015 . He has been upset my this and tearful. She also reports pain in her throat after taking valsartan, would like a medication change. No other dysphagia/neck pain. She reports rare chest pain (only she is anxious) and occasional sob that is relieved with inhaler.   1. HTN - No recent vitals to monitor BP, change Valsartan to Losartan 50 mg daily, scheduled weekly vital signs.  2. Bereavement - due to recent death of daughter, Dr. Kennon [name] plans to follow up with [name] and monitor her for sings of depression/anxiety.  3. Intermittent Chest pain - Echo on 12/20 showed EF 60-65% (unable to evaluate diastolic function), CXR on 12/14 showed stable right hilar prominence. Currently asymptomatic. Exacerbated by anxiety. Will hold on further workup however consider cardiology referral if symptoms persist as she has multiple risk factors.   I otherwise agree with the assessment and plan as outlined by Dr. Leonides [name].  Lynn Sham MD

## 2015-04-28 ENCOUNTER — Encounter: Payer: Self-pay | Admitting: Pharmacist

## 2015-05-07 LAB — BASIC METABOLIC PANEL
BUN: 35 mg/dL — AB (ref 4–21)
Creatinine: 0.9 mg/dL (ref 0.5–1.1)
GLUCOSE: 82 mg/dL
Potassium: 4.6 mmol/L (ref 3.4–5.3)
Sodium: 141 mmol/L (ref 137–147)

## 2015-05-13 ENCOUNTER — Other Ambulatory Visit: Payer: Self-pay | Admitting: Family Medicine

## 2015-05-13 MED ORDER — LOSARTAN POTASSIUM 100 MG PO TABS: 100.0000 mg | ORAL_TABLET | Freq: Every day | ORAL | Status: DC

## 2015-05-13 MED ORDER — CHOLECALCIFEROL 50 MCG (2000 UT) PO CAPS: 2000.0000 [IU] | ORAL_CAPSULE | Freq: Every day | ORAL | Status: AC

## 2015-05-15 ENCOUNTER — Non-Acute Institutional Stay: Payer: Medicare Other | Admitting: Family Medicine

## 2015-05-15 DIAGNOSIS — D509 Iron deficiency anemia, unspecified: Secondary | ICD-10-CM

## 2015-05-15 DIAGNOSIS — F409 Phobic anxiety disorder, unspecified: Secondary | ICD-10-CM | POA: Diagnosis not present

## 2015-05-15 DIAGNOSIS — M1990 Unspecified osteoarthritis, unspecified site: Secondary | ICD-10-CM | POA: Diagnosis not present

## 2015-05-15 DIAGNOSIS — I10 Essential (primary) hypertension: Secondary | ICD-10-CM | POA: Diagnosis not present

## 2015-05-15 DIAGNOSIS — E119 Type 2 diabetes mellitus without complications: Secondary | ICD-10-CM | POA: Diagnosis not present

## 2015-05-15 NOTE — Progress Notes (Signed)
Patient ID: [name], female   DOB: 1940-10-07, 75 y.o.   MRN: [id number] [facility]  Visit  Primary Care Provider: Rodrigo Ran, MD Location of Care: [facility] Visit Information: a scheduled routine follow-up visit Patient accompanied by no one Source(s) of information for visit: patient and EMR   Chief Complaint: death of daughter.  Patient upset about the death of her daughter, notes this is new since when I saw her last.  She saw a psychiatrist per her report and felt better knowing she "wasn't crazy" for being upset. Unable to find these notes in our system or [facility] system. She looks at her picture often. Denies any hallucinations or SI.   Nutrition Concerns: Patient notes she's doing well with eating. She's increased by 10lbs in the last several months. She is missing her bottom teeth so picks foods that are easier for her to eat.  Wound Care Nurse Concerns: none  PT Concerns and Goals: Patient normally works with PT twice per week on AROM and transfers. She has not been making much progress in this and notes that the physical therapist she likes has not been around in some time. She mostly just lies in bed.   HISTORY OF PRESENT ILLNESS: Outpatient Encounter Prescriptions as of 05/15/2015  Medication Sig  . acetaminophen (TYLENOL) 650 MG CR tablet Take 650 mg by mouth every 8 (eight) hours as needed for pain.  Marland Kitchen albuterol (PROVENTIL HFA;VENTOLIN HFA) 108 (90 BASE) MCG/ACT inhaler Inhale 2 puffs into the lungs every 6 (six) hours as needed for wheezing or shortness of breath. (Patient taking differently: Inhale 2 puffs into the lungs every 4 (four) hours as needed for wheezing or shortness of breath. )  . calcium citrate (CALCITRATE - DOSED IN MG ELEMENTAL CALCIUM) 950 MG tablet Take 200 mg of elemental calcium by mouth daily.  . Cholecalciferol 2000 units CAPS Take 1 capsule (2,000 Units total) by mouth daily.  Marland Kitchen losartan (COZAAR) 100 MG tablet Take 1  tablet (100 mg total) by mouth daily.  . magnesium hydroxide (MILK OF MAGNESIA) 800 MG/5ML suspension Take by mouth daily as needed for constipation.   No facility-administered encounter medications on file as of 05/15/2015.   Allergies  Allergen Reactions  . Aspirin Other (See Comments)    Bleeding   . Latex Itching  . Penicillins Rash    Has patient had a PCN reaction causing immediate rash, facial/tongue/throat swelling, SOB or lightheadedness with hypotension: No Has patient had a PCN reaction causing severe rash involving mucus membranes or skin necrosis: No Has patient had a PCN reaction that required hospitalization No Has patient had a PCN reaction occurring within the last 10 years: No If all of the above answers are "NO", then may proceed with Cephalosporin use.    History Patient Active Problem List   Diagnosis Date Noted  . Osteoarthritis 03/11/2015  . Anemia 03/11/2015  . Bone spur of left foot   . 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 Person living in residential institution 12/03/2014  . Diabetes mellitus without complication (HCC) 11/29/2014  . Hyperlipemia   . Essential hypertension   . Fall   . Solitary pulmonary nodule, possible 08/29/2014   Past Medical History  Diagnosis Date  . Hypertension   . Diabetes mellitus   . Obesity   . Hyperlipemia   . Osteoarthritis   . Gout   . Depression   . Hemorrhoid   . Iron deficiency anemia secondary to blood loss (chronic)   .  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 UTI (lower urinary tract infection) 11/29/2014  . Essential hypertension   . Diabetes mellitus without complication (HCC) 11/29/2014  . Person living in residential institution 12/03/2014  . Fall   . Bone spur of left foot   . Solitary pulmonary nodule 01/07/2015   Past Surgical History  Procedure Laterality Date  . Knee surgery Right    Family History  Problem Relation Age of Onset  . Heart attack Mother   . Diabetes Father     reports that she has quit  smoking. She does not have any smokeless tobacco history on file.    Diet:  mechanical soft  Review of Systems  Patient has ability to communicate answers to ROS: yes See HPI  Geriatric Syndromes: Constipation no   Incontinence yes, wears adult diapers Dizziness no   Syncope no   Skin problems no   Visual Impairment no   Hearing impairment yes  Eating impairment yes- dentures, poor dentition Impaired Memory or Cognition no   Behavioral problems no   Sleep problems no   Weight loss no    Pain:  Pain Location: Back, left shoulder, right shoulder well controlled on Tylenol Pain Rating: not currently in pain.  Pain Duration: chronic but controlled Pain Therapies: Tylenol Pain Response to Therapies: yes Bowel Movement Difficulty: no  Dyspnea: Dyspnea Rating: denies currently Dyspnea Goal: none Dyspnea Therapies: albuterol PRN Dyspnea Response to Therapies: good   General: Denies fevers, chills, progressive fatigue, weight gain.  Eyes: Denies pain, blurred vision  Ears/Nose/Throat: Denies ear pain, throat pain, rhinorrhea, nasal congestion.  Cardiovascular: Denies chest pains, palpitations, dyspnea on exertion, orthopnea, peripheral edema.  Respiratory: Denies cough, sputum, dyspnea  Gastrointestinal: Denies abdominal pain, bloating, constipation, diarrhea.  Genitourinary: Denies dysuria, urinary frequency, discharge Musculoskeletal: Denies joint pain, swelling, weakness.  Skin: Denies skin rash or ulcers. Neurologic: Denies transient paralysis, weakness, paresthesias, headache.  Psychiatric: Denies depression, anxiety, psychosis. Does note sadness about the death of her daughter.  Endocrine: Denies weight loss   PHYSICAL EXAM:. BP: 182-127/96-66 Wt: 177.4lbs   General: alert, cooperative, very talkative, well nourished, pleasant, clean, groomed HEENT:  No scleral icterus, no nasal secretions, Oromucosa moist and no erythema or lesion. Dentures on top, poor dentition  on bottom.  Neck:  Supple, No JVD, no lymphadenopathy CV:  RRR, no murmur, no ankle swelling RESP: No resp distress or accessory muscle use.  Clear to ausc bilat. No wheezing, no rales, no rhonchi.  ABD:  Soft, Non-tender, non-distended, +bowel sounds MSK:  No back pain, no joint pain.  Swelling of the knees bilaterally without erythema.  EXT: Warm and well perfused   no edema, no erythema, pulses WNL and trace edema Gait:  Not tested Skin:No rashes or lesions noted. Neurologic:Cranial nerves normal besides decreased hearing;  Muscle Tone within normal limits and atrophy; Motor Strength: generalized weakness, can lift legs against gravity.  Psych:  Orientation oriented to person, place, time, and general circumstances; Judgment Good, Insight Intact Memory recent and remote memory intact; Attention Normal;  Mood appropriate; Speech normal; Language none ; Thought Coherent and Relevant   Assessment and Plan:   See Problem List for individual problem's assessment and plans.    Code Status:     FULL CODE per discussion this appointment

## 2015-05-17 ENCOUNTER — Encounter: Payer: Self-pay | Admitting: Family Medicine

## 2015-05-17 LAB — CALCIUM: Calcium: 10.4 mg/dL — AB (ref 8.6–10.3)

## 2015-05-17 NOTE — Assessment & Plan Note (Signed)
Adequate glycemic control currently without pharmacologic intervention.  - not on ASA due to h/o Gl bleed  - currently on ARB for renal protection - continue to avoid simple carbohydrates.   - continue to check CBGs weekly.

## 2015-05-17 NOTE — Assessment & Plan Note (Signed)
BPs not at goal. Losartan was increased from  to  on 05/13/15.  - continue to monitor BPs daily, then can eventually transition to weekly. - will repeat BMET in 2 weeks given change in losartan dose.

## 2015-05-17 NOTE — Assessment & Plan Note (Addendum)
Stable. On calcium and vitamin D. Tylenol as needed for pain.

## 2015-05-17 NOTE — Assessment & Plan Note (Signed)
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  Was seeing Dr. Clelia Croft with hematology. Hemoglobin has been stable. - continue to monitor hemoglobin q6 months, if declining consider f/u back with Dr. Clelia Croft

## 2015-05-17 NOTE — Assessment & Plan Note (Addendum)
Stable, no falls documented. Continuing to work with PT.

## 2015-05-18 ENCOUNTER — Encounter: Payer: Self-pay | Admitting: Family Medicine

## 2015-05-18 ENCOUNTER — Other Ambulatory Visit: Payer: Self-pay | Admitting: Family Medicine

## 2015-05-18 HISTORY — DX: Hypercalcemia: E83.52

## 2015-05-18 NOTE — Progress Notes (Signed)
Patient ID: [name], female   DOB: [birth year], 75 y.o.   MRN: [id number] I have interviewed and examined the patient.  I have discussed the case and verified the key findings with Dr. Leonides Schanz.   I agree with their assessments and plans as documented in their visit note.

## 2015-05-18 NOTE — Progress Notes (Signed)
Patient ID: [name], female   DOB: 1940-10-02, 75 y.o.   MRN: [id number] I have interviewed and examined the patient.  I have discussed the case and verified the key findings with Dr. Leonides Schanz.   I agree with their assessments and plans as documented in their visit note.  Please see my brief note from 05/18/15

## 2015-05-18 NOTE — Progress Notes (Signed)
Patient ID: [name], female   DOB: 1940-08-23, 75 y.o.   MRN: [id number] Lab Results  Component Value Date   CALCIUM 10.4* 05/07/2015   No overt symptoms of hypercalcemia Was on Vitamin D3 4000 units daily. Will reduce to Vitamin D3 2000 units daily. Continue Calcium Citrate 950 mg daily Will recheck serum calcium in a month.

## 2015-06-18 ENCOUNTER — Encounter: Payer: Self-pay | Admitting: Pharmacist

## 2015-07-24 ENCOUNTER — Non-Acute Institutional Stay (INDEPENDENT_AMBULATORY_CARE_PROVIDER_SITE_OTHER): Payer: Medicare Other | Admitting: Family Medicine

## 2015-07-24 DIAGNOSIS — E119 Type 2 diabetes mellitus without complications: Secondary | ICD-10-CM

## 2015-07-24 DIAGNOSIS — L304 Erythema intertrigo: Secondary | ICD-10-CM | POA: Insufficient documentation

## 2015-07-24 DIAGNOSIS — B3731 Acute candidiasis of vulva and vagina: Secondary | ICD-10-CM

## 2015-07-24 DIAGNOSIS — B373 Candidiasis of vulva and vagina: Secondary | ICD-10-CM

## 2015-07-24 DIAGNOSIS — I10 Essential (primary) hypertension: Secondary | ICD-10-CM

## 2015-07-24 HISTORY — DX: Erythema intertrigo: L30.4

## 2015-07-24 NOTE — Assessment & Plan Note (Signed)
Currently no evidence of infection with intertrigo under breasts, but prior history and some pruritus currently - Improved on topical ointment - Written order to apply Aloe Vesta skin protecting ointment under breasts daily per patient request, can follow-up if helping or not

## 2015-07-24 NOTE — Assessment & Plan Note (Addendum)
Ordered for patient to be added to podiatry ([facility]) monthly list when podiatrist comes to [facility], for routine foot care

## 2015-07-24 NOTE — Assessment & Plan Note (Signed)
Variable BPs since DC losartan 100 07/08/15. Ranging SBP 100-160 - Previously have ordered to check BP 3 times weekly for 2 weeks, however this was not done - Discussed with nursing staff, written order for check BP daily for 1 week - F/u within 1 week, determine if need to find alternative anti-HTN regimen

## 2015-07-24 NOTE — Progress Notes (Signed)
[facility] - [facility] Geriatrics Team Progress Note  Subjective:    Patient ID: [name], female    DOB: 1940/05/22, 75 y.o.   MRN: [id number]  [name] is a 75 y.o. female presenting on 07/24/2015 for Rash and Hypertension   HPI   SKIN RASH / ITCHING UNDER BREASTS / VAGINITIS: - Reports prior history of yeast infection under breasts in skin folds. Currently with symptoms itching and irritation under both breasts, often will get "too moist", has not tolerated drying powders in past due to "worsening breathing". She has the nurses put on some Aloe Vesta skin protecterant ointment that seems to help. Also reports vaginal irritation with itching and has had some discharge before. - Denies any fevers/chills, sweats, skin redness or swelling, painful skin or ulceration  HTN - Chronic problem. Recently Losartan  DC'd on 07/08/15 due to patient complaint of headaches and nec pain. Symptoms seemed to improve after DC. BP has been variable. Requests re-check BP more frequently. - Denies active HA, CP, dyspnea  H/o DM / Chronic dry skin on feet - History of prior elevated A1c consistent with DM. Last A1c 5.7 in 2016. Regardless she requests more regular "foot care", states when she used to go to the doctor they would look at her feet "each visit". She describes prior history of toenail fungus of Left great toe, has had toenail removal before, also dry thick skin of feet. - Requests to go to Podiatrist  HEARING LOSS, Chronic / Ear Wax - Prior problem with difficulty hearing, has had hearing aids in past. 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 Also problems with ear wax build up. Previously requested her ears to be re-examined, however states they have "come out here to look in my ears recently, but they forgot the equipment". Today she seems to have changed her mind, and does not want instrumentation in her ears.   Social History  Substance Use Topics  . Smoking status: Former Games developer   . Smokeless tobacco: Not on file  . Alcohol Use: Not on file    Review of Systems Per HPI unless specifically indicated above     Objective:    There were no vitals taken for this visit.  Wt Readings from Last 3 Encounters:  11/29/14 147 lb 14.9 oz (67.1 kg)  12/01/14 147 lb (66.679 kg)  11/16/10 241 lb 6.4 oz (109.498 kg)    Physical Exam  Constitutional: She appears well-developed and well-nourished. No distress.  Elderly appearing 48 female, obese, comfortable, conversational  HENT:  Head: Normocephalic and atraumatic.  Right Ear: External ear normal.  Left Ear: External ear normal.  Mouth/Throat: Oropharynx is clear and moist.  No otoscope available at this time and patient deferred ear exam regardless  Neck: Neck supple.  Cardiovascular: Normal rate and intact distal pulses.   Pulmonary/Chest: Effort normal.  Genitourinary:  Deferred exam of pelvic region today.  Musculoskeletal: She exhibits no edema.  Neurological: She is alert.  Skin: Skin is warm and dry. She is not diaphoretic.  Under bilateral breast tissue skin appears moist with current ointment in place, otherwise no erythema or skin breakdown, no maceration, no evidence of candidal intertrigo under breasts.  Nursing note and vitals reviewed.      Assessment & Plan:   Problem List Items Addressed This Visit    Pruritic intertrigo    Currently no evidence of infection with intertrigo under breasts, but prior history and some pruritus currently - Improved on  topical ointment - Written order to apply Aloe Vesta skin protecting ointment under breasts daily per patient request, can follow-up if helping or not      Essential hypertension - Primary    Variable BPs since DC losartan 100 07/08/15. Ranging SBP 100-160 - Previously have ordered to check BP 3 times weekly for 2 weeks, however this was not done - Discussed with nursing staff, written order for check BP daily for 1 week - F/u within 1 week,  determine if need to find alternative anti-HTN regimen      Diabetes mellitus without complication (HCC)    Ordered for patient to be added to podiatry ([facility]) monthly list when podiatrist comes to [facility], for routine foot care       Other Visit Diagnoses    Yeast vaginitis        Written order for Fluconazole 150mg  x 1 dose for yeast vaginitis and potential intertrigo          No orders of the defined types were placed in this encounter.      Follow up plan: 1 week, f/u BP, intertrigo   Saralyn PilarAlexander Karamalegos, DO [facility], PGY-3

## 2015-07-28 ENCOUNTER — Non-Acute Institutional Stay: Payer: Medicare Other | Admitting: Family Medicine

## 2015-07-28 DIAGNOSIS — E119 Type 2 diabetes mellitus without complications: Secondary | ICD-10-CM | POA: Diagnosis not present

## 2015-07-28 DIAGNOSIS — I10 Essential (primary) hypertension: Secondary | ICD-10-CM | POA: Diagnosis not present

## 2015-07-28 DIAGNOSIS — M1990 Unspecified osteoarthritis, unspecified site: Secondary | ICD-10-CM

## 2015-07-28 DIAGNOSIS — L304 Erythema intertrigo: Secondary | ICD-10-CM

## 2015-07-28 NOTE — Progress Notes (Signed)
Patient ID: Lynn KudoMina S [name], female   DOB: 02/08/1941, 75 y.o.   MRN: [id number] [facility]  Visit  Primary Care Provider: Rodrigo Ranrystal Lynn Palazzo, MD [facility] Visit Information: a scheduled routine follow-up visit Patient accompanied by no one Source(s) of information for visit: patient and EMR   Chief Complaint:  Patient doing better from the standpoint of losing her daughter. She notes it may be a little more difficult as Mother's day is coming near.    Her skin rash has improved with Aloe Vesta skin Protectant.   Her vulvar pruritis has improved however she notes that she did not like the Diflucan, as it gave her significant GI upset the following day.  HTN: she was taken off losartan due to complaints of neck pain and headaches. She's had numerous odd complaints with other antihypertensives in the past.  H/o DM: the patient's last A1c was 5.7 in 2016. Her CBGs had been running in the 100s. She asked to see podiatry on a regular basis- she notes dry feet and issues with fungal infections in the past.   Nutrition Concerns: Patient notes she's doing well with eating.   Wound Care Nurse Concerns: none  PT Concerns and Goals: Patient not incredibly active. She is using her resistance bands.   HISTORY OF PRESENT ILLNESS: Outpatient Encounter Prescriptions as of 07/28/2015  Medication Sig  . acetaminophen (TYLENOL) 650 MG CR tablet Take 650-1,300 mg by mouth every 8 (eight) hours as needed for pain. 650 mg @2am  and 10 am; 1300mg  @ 6pm  . albuterol (PROVENTIL HFA;VENTOLIN HFA) 108 (90 BASE) MCG/ACT inhaler Inhale 2 puffs into the lungs every 6 (six) hours as needed for wheezing or shortness of breath. (Patient taking differently: Inhale 2 puffs into the lungs every 4 (four) hours as needed for wheezing or shortness of breath. )  . calcium citrate (CALCITRATE - DOSED IN MG ELEMENTAL CALCIUM) 950 MG tablet Take 200 mg of elemental calcium by mouth daily.  .  Cholecalciferol 2000 units CAPS Take 1 capsule (2,000 Units total) by mouth daily.  Marland Kitchen. losartan (COZAAR) 100 MG tablet Take 1 tablet (100 mg total) by mouth daily.  . magnesium hydroxide (MILK OF MAGNESIA) 800 MG/5ML suspension Take by mouth daily as needed for constipation.   No facility-administered encounter medications on file as of 07/28/2015.   Allergies  Allergen Reactions  . Aspirin Other (See Comments)    Bleeding   . Latex Itching  . Penicillins Rash    Has patient had a PCN reaction causing immediate rash, facial/tongue/throat swelling, SOB or lightheadedness with hypotension: No Has patient had a PCN reaction causing severe rash involving mucus membranes or skin necrosis: No Has patient had a PCN reaction that required hospitalization No Has patient had a PCN reaction occurring within the last 10 years: No If all of the above answers are "NO", then may proceed with Cephalosporin use.    History Patient Active Problem List   Diagnosis Date Noted  . Pruritic intertrigo 07/24/2015  . Hypercalcemia 05/18/2015  . Osteoarthritis 03/11/2015  . Anemia 03/11/2015  . Bone spur of left foot   . 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 Person living in residential institution 12/03/2014  . Diabetes mellitus without complication (HCC) 11/29/2014  . Hyperlipemia   . Essential hypertension   . Fall   . Solitary pulmonary nodule, possible 08/29/2014   Past Medical History  Diagnosis Date  . Hypertension   . Diabetes mellitus   . Obesity   .  Hyperlipemia   . Osteoarthritis   . Gout   . Depression   . Hemorrhoid   . Iron deficiency anemia secondary to blood loss (chronic)   . 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 UTI (lower urinary tract infection) 11/29/2014  . Essential hypertension   . Diabetes mellitus without complication (HCC) 11/29/2014  . Person living in residential institution 12/03/2014  . Fall   . Bone spur of left foot   . Solitary pulmonary nodule 01/07/2015  . Hypercalcemia  05/18/2015   Past Surgical History  Procedure Laterality Date  . Knee surgery Right    Family History  Problem Relation Age of Onset  . Heart attack Mother   . Diabetes Father     reports that she has quit smoking. She does not have any smokeless tobacco history on file.    Diet:  mechanical soft  Review of Systems  Patient has ability to communicate answers to ROS: yes See HPI  Geriatric Syndromes: Constipation no   Incontinence yes, wears adult diapers Dizziness no   Syncope no   Skin problems no (other than above)   Visual Impairment no   Hearing impairment yes (stable) Eating impairment yes- dentures, poor dentition Impaired Memory or Cognition no   Behavioral problems no   Sleep problems no   Weight loss no    Pain:  Pain Location: none currently, has had shoulder pain previously  Pain Rating: not currently in pain.  Pain Duration: chronic but controlled Pain Therapies: Tylenol Pain Response to Therapies: yes Bowel Movement Difficulty: no  Dyspnea: Dyspnea Rating: denies currently Dyspnea Goal: none Dyspnea Therapies: albuterol PRN Dyspnea Response to Therapies: good   General: Denies fevers, chills, progressive fatigue, weight gain.  Eyes: Denies pain, blurred vision  Ears/Nose/Throat: Denies ear pain, throat pain, rhinorrhea, nasal congestion.  Cardiovascular: Denies chest pains, palpitations, dyspnea on exertion, orthopnea, peripheral edema.  Respiratory: Denies cough, sputum, dyspnea  Gastrointestinal: Denies abdominal pain, bloating, constipation, diarrhea.  Genitourinary: Denies dysuria, urinary frequency, discharge Musculoskeletal: Denies joint pain, swelling, weakness.  Skin: Denies skin rash or ulcers. Neurologic: Denies transient paralysis, weakness, paresthesias, headache.  Psychiatric: Denies depression, anxiety, psychosis. Does note sadness about the death of her daughter.  Endocrine: Denies weight loss   PHYSICAL EXAM:. BP readings over  the last week: 128/78 158/80 145/72 132/70  General: alert, cooperative, very talkative, well nourished, pleasant, clean, groomed HEENT:  No scleral icterus, no nasal secretions, Oral mucosa moist and no erythema or lesion. Dentures on top, poor dentition on bottom.  Neck:  Supple, No JVD, no lymphadenopathy CV:  RRR, no murmur RESP: No resp distress or accessory muscle use.  Clear to ausc bilat. No wheezing, no rales, no rhonchi.  ABD:  Soft, Non-tender, non-distended, +bowel sounds. Easily reproducibly umbilical hernia.  MSK:  No back pain, no joint pain.   EXT: Warm and well perfused. Pulses WNL. Trace ankle edema.  Gait:  Not tested Skin:No rashes or lesions noted. Specifically, minimal moisture under the breast without erythema or skin break down. Neurologic:Cranial nerves normal besides decreased hearing;  Muscle Tone normal with some muscular atrophy.  Motor Strength: generalized weakness, able to lift legs minimally against gravity.  Psych:  Orientation: oriented to person, place, time, and general circumstances; Judgment Good. Insight Intact Memory recent and remote memory intact; Attention Normal;  Mood appropriate; Speech normal;  Thought Coherent   Assessment and Plan:   See Problem List for individual problem's assessment and plans.    Code Status:  FULL CODE per discussion with patient

## 2015-07-30 NOTE — Assessment & Plan Note (Addendum)
BPs fairly well controlled off losartan (which was discontinued a few days ago).  Currently off all anti-hypertensives.  BPs to continue to be checked daily until 1 week off losartan, then twice weekly after that.  - repeat BMET.

## 2015-07-30 NOTE — Assessment & Plan Note (Signed)
No evidence of infection or irritation noted under the breasts. She has minimal moisture but endorses improvement. - continue Aloe Vesta skin protecting ointment - continue to monitor

## 2015-07-30 NOTE — Assessment & Plan Note (Signed)
Adequate glycemic control without pharmacologic intervention.  - placed on podiatry nursing home list last week. - not on aspirin due to history of GI bleed - now off ARB due to side effects, has not done well with other ARBs in the past. - continue to avoid simple carbohydrates- the patient's son is now brining her Pepsi, we  Discussed using caution with how often/much soft drink she consumes. - continue to check CBGs weekly.

## 2015-07-30 NOTE — Assessment & Plan Note (Signed)
Stable. Pain controlled.  - continue Tylenol PRN.

## 2015-07-30 NOTE — Assessment & Plan Note (Signed)
No symptoms of elevated calcemia. Was reduced to vitamin D3 2000 units daily in addition to calcium citrate. Continue these. - will check calcium.

## 2015-07-31 ENCOUNTER — Encounter: Payer: Self-pay | Admitting: Pharmacist

## 2015-08-04 NOTE — Progress Notes (Signed)
FMTS Attending Note  I personally saw and evaluated the patient. The plan of care was discussed with the resident team. I agree with the assessment and plan as documented by the resident.   Additionally: 1. HTN - well controlled, off all BP meds, monitor periodically 2. Hypercalcemia - if elevated again would stop Cacium carbonate and consider SPEP/UPEP. 3. DM - diet controlled, agree with holding ARB as blood pressure controlled   Donnella ShamKyle Fletke MD

## 2015-08-24 ENCOUNTER — Non-Acute Institutional Stay (INDEPENDENT_AMBULATORY_CARE_PROVIDER_SITE_OTHER): Payer: Medicare Other | Admitting: Family Medicine

## 2015-08-24 DIAGNOSIS — R635 Abnormal weight gain: Secondary | ICD-10-CM

## 2015-08-24 NOTE — Progress Notes (Signed)
[facility] Geriatrics Team Progress Note  Subjective:    Patient ID: [name], female    DOB: February 25, 1941, 75 y.o.   MRN: [id number]  Lynn KudoMina S [name] is a 75 y.o. female presenting on 08/24/2015 for Weight Gain   HPI  Weight gain:   She has had a 7 pound weight gain since April.  During that time she reports her appetite to be normal.  She reports she is at her baseline for swelling in her legs.  There is no orthopnea or PND.  No complaints with breathing.   06/24/15 173 lbs 05/25/15  172 lbs 05/04/15  177 lbs 04/30/15  174 lbs   Social History  Substance Use Topics  . Smoking status: Former Games developermoker  . Smokeless tobacco: Not on file  . Alcohol Use: Not on file    Review of Systems Per HPI unless specifically indicated above     Objective:    BP 155/70 mmHg  Ht 5' (1.524 m)  Wt 180 lb (81.647 kg)  BMI 35.15 kg/m2  Wt Readings from Last 3 Encounters:  08/24/15 180 lb (81.647 kg)  11/29/14 147 lb 14.9 oz (67.1 kg)  12/01/14 147 lb (66.679 kg)    Physical Exam  Constitutional: She is oriented to person, place, and time.  HENT:  Head: Normocephalic and atraumatic.  Eyes: Conjunctivae and EOM are normal.  Neck: Normal range of motion.  Cardiovascular: Normal rate, regular rhythm, normal heart sounds and intact distal pulses.   Trace edema in bilateral lower extremities.   Pulmonary/Chest: Effort normal and breath sounds normal. She has no wheezes. She has no rales.  Abdominal: Soft. Bowel sounds are normal.  Musculoskeletal: Normal range of motion.  Neurological: She is alert and oriented to person, place, and time.  Skin: Skin is warm.   Results for orders placed or performed in visit on 05/17/15  Basic metabolic panel  Result Value Ref Range   Glucose 82 mg/dL   BUN 35 (A) 4 - 21 mg/dL   Creatinine 0.9 0.5 - 1.1 mg/dL   Potassium 4.6 3.4 - 5.3 mmol/L   Sodium 141 137 - 147 mmol/L  Calcium  Result Value Ref Range   Calcium 10.4 (A) 8.6 -  10.3 mg/dL      Assessment & Plan:   Problem List Items Addressed This Visit      Unprioritized   Weight gain - Primary    Weight gain seems to be related to adequate dietary intake instead of edema  Displayed no signs of fluid excess on exam  Her ECHO in 02/2015 was normal showing 60-65% EF with mild LVF  He weight has been up and down since the new year.  - continue to monitor weight and fluid excess          No orders of the defined types were placed in this encounter.      Follow up plan: No Follow-up on file.  A total of 15 minutes was spent face-to-face with this patient. Greater than 50% of this time was spent in counseling and coordination of care with the patient.   Clare GandyJeremy Rinda Rollyson, MD [facility], PGY-3

## 2015-08-24 NOTE — Assessment & Plan Note (Addendum)
Weight gain seems to be related to adequate dietary intake instead of edema  Displayed no signs of fluid excess on exam  Her ECHO in 02/2015 was normal showing 60-65% EF with mild LVF  He weight has been up and down since the new year.  - continue to monitor weight and fluid excess

## 2015-08-26 ENCOUNTER — Encounter (HOSPITAL_COMMUNITY): Payer: Self-pay | Admitting: *Deleted

## 2015-08-26 ENCOUNTER — Emergency Department (HOSPITAL_COMMUNITY)
Admission: EM | Admit: 2015-08-26 | Discharge: 2015-08-26 | Disposition: A | Discharge status: Home / Self Care | Payer: Medicare Other | Attending: Emergency Medicine | Admitting: Emergency Medicine

## 2015-08-26 DIAGNOSIS — R0789 Other chest pain: Secondary | ICD-10-CM | POA: Insufficient documentation

## 2015-08-26 DIAGNOSIS — Z87891 Personal history of nicotine dependence: Secondary | ICD-10-CM | POA: Diagnosis not present

## 2015-08-26 DIAGNOSIS — Z91040 Latex allergy status: Secondary | ICD-10-CM | POA: Diagnosis not present

## 2015-08-26 DIAGNOSIS — I10 Essential (primary) hypertension: Secondary | ICD-10-CM | POA: Diagnosis not present

## 2015-08-26 DIAGNOSIS — R079 Chest pain, unspecified: Secondary | ICD-10-CM

## 2015-08-26 DIAGNOSIS — E119 Type 2 diabetes mellitus without complications: Secondary | ICD-10-CM | POA: Insufficient documentation

## 2015-08-26 DIAGNOSIS — Z79899 Other long term (current) drug therapy: Secondary | ICD-10-CM | POA: Diagnosis not present

## 2015-08-26 LAB — I-STAT TROPONIN, ED: TROPONIN I, POC: 0 ng/mL (ref 0.00–0.08)

## 2015-08-26 NOTE — Discharge Instructions (Signed)
Nonspecific Chest Pain  °Chest pain can be caused by many different conditions. There is always a chance that your pain could be related to something serious, such as a heart attack or a blood clot in your lungs. Chest pain can also be caused by conditions that are not life-threatening. If you have chest pain, it is very important to follow up with your health care provider. °CAUSES  °Chest pain can be caused by: °· Heartburn. °· Pneumonia or bronchitis. °· Anxiety or stress. °· Inflammation around your heart (pericarditis) or lung (pleuritis or pleurisy). °· A blood clot in your lung. °· A collapsed lung (pneumothorax). It can develop suddenly on its own (spontaneous pneumothorax) or from trauma to the chest. °· Shingles infection (varicella-zoster virus). °· Heart attack. °· Damage to the bones, muscles, and cartilage that make up your chest wall. This can include: °¨ Bruised bones due to injury. °¨ Strained muscles or cartilage due to frequent or repeated coughing or overwork. °¨ Fracture to one or more ribs. °¨ Sore cartilage due to inflammation (costochondritis). °RISK FACTORS  °Risk factors for chest pain may include: °· Activities that increase your risk for trauma or injury to your chest. °· Respiratory infections or conditions that cause frequent coughing. °· Medical conditions or overeating that can cause heartburn. °· Heart disease or family history of heart disease. °· Conditions or health behaviors that increase your risk of developing a blood clot. °· Having had chicken pox (varicella zoster). °SIGNS AND SYMPTOMS °Chest pain can feel like: °· Burning or tingling on the surface of your chest or deep in your chest. °· Crushing, pressure, aching, or squeezing pain. °· Dull or sharp pain that is worse when you move, cough, or take a deep breath. °· Pain that is also felt in your back, neck, shoulder, or arm, or pain that spreads to any of these areas. °Your chest pain may come and go, or it may stay  constant. °DIAGNOSIS °Lab tests or other studies may be needed to find the cause of your pain. Your health care provider may have you take a test called an ambulatory ECG (electrocardiogram). An ECG records your heartbeat patterns at the time the test is performed. You may also have other tests, such as: °· Transthoracic echocardiogram (TTE). During echocardiography, sound waves are used to create a picture of all of the heart structures and to look at how blood flows through your heart. °· Transesophageal echocardiogram (TEE). This is a more advanced imaging test that obtains images from inside your body. It allows your health care provider to see your heart in finer detail. °· Cardiac monitoring. This allows your health care provider to monitor your heart rate and rhythm in real time. °· Holter monitor. This is a portable device that records your heartbeat and can help to diagnose abnormal heartbeats. It allows your health care provider to track your heart activity for several days, if needed. °· Stress tests. These can be done through exercise or by taking medicine that makes your heart beat more quickly. °· Blood tests. °· Imaging tests. °TREATMENT  °Your treatment depends on what is causing your chest pain. Treatment may include: °· Medicines. These may include: °¨ Acid blockers for heartburn. °¨ Anti-inflammatory medicine. °¨ Pain medicine for inflammatory conditions. °¨ Antibiotic medicine, if an infection is present. °¨ Medicines to dissolve blood clots. °¨ Medicines to treat coronary artery disease. °· Supportive care for conditions that do not require medicines. This may include: °¨ Resting. °¨ Applying heat   or cold packs to injured areas. °¨ Limiting activities until pain decreases. °HOME CARE INSTRUCTIONS °· If you were prescribed an antibiotic medicine, finish it all even if you start to feel better. °· Avoid any activities that bring on chest pain. °· Do not use any tobacco products, including  cigarettes, chewing tobacco, or electronic cigarettes. If you need help quitting, ask your health care provider. °· Do not drink alcohol. °· Take medicines only as directed by your health care provider. °· Keep all follow-up visits as directed by your health care provider. This is important. This includes any further testing if your chest pain does not go away. °· If heartburn is the cause for your chest pain, you may be told to keep your head raised (elevated) while sleeping. This reduces the chance that acid will go from your stomach into your esophagus. °· Make lifestyle changes as directed by your health care provider. These may include: °¨ Getting regular exercise. Ask your health care provider to suggest some activities that are safe for you. °¨ Eating a heart-healthy diet. A registered dietitian can help you to learn healthy eating options. °¨ Maintaining a healthy weight. °¨ Managing diabetes, if necessary. °¨ Reducing stress. °SEEK MEDICAL CARE IF: °· Your chest pain does not go away after treatment. °· You have a rash with blisters on your chest. °· You have a fever. °SEEK IMMEDIATE MEDICAL CARE IF:  °· Your chest pain is worse. °· You have an increasing cough, or you cough up blood. °· You have severe abdominal pain. °· You have severe weakness. °· You faint. °· You have chills. °· You have sudden, unexplained chest discomfort. °· You have sudden, unexplained discomfort in your arms, back, neck, or jaw. °· You have shortness of breath at any time. °· You suddenly start to sweat, or your skin gets clammy. °· You feel nauseous or you vomit. °· You suddenly feel light-headed or dizzy. °· Your heart begins to beat quickly, or it feels like it is skipping beats. °These symptoms may represent a serious problem that is an emergency. Do not wait to see if the symptoms will go away. Get medical help right away. Call your local emergency services (911 in the [place] Do not drive yourself to the hospital. °  °This  information is not intended to replace advice given to you by your health care provider. Make sure you discuss any questions you have with your health care provider. °  °Document Released: 12/15/2004 Document Revised: 03/28/2014 Document Reviewed: 10/11/2013 °Elsevier Interactive Patient Education ©2016 [employer] ° °
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
## 2015-09-01 ENCOUNTER — Encounter: Payer: Self-pay | Admitting: Family Medicine

## 2015-09-01 NOTE — Progress Notes (Signed)
Patient ID: Gita KudoMina S Wilhelmi, female   DOB: 1940/12/02, 75 y.o.   MRN: [id number] I discussed the patient's case with  Dr. Eston MouldJeremy Schmit.  I agree with their plans documented in his  Acute nursing home visit note.

## 2015-09-11 ENCOUNTER — Encounter: Payer: Self-pay | Admitting: Pharmacist

## 2015-09-21 ENCOUNTER — Encounter: Payer: Self-pay | Admitting: Family Medicine

## 2015-09-21 LAB — BASIC METABOLIC PANEL
BUN: 21 mg/dL (ref 4–21)
Bicarbonate: 28
CHLORIDE: 105 mmol/L
CREATINE, SERUM: 0.65
Calcium: 9.6 mg/dL
Glucose: 90
Potassium: 4.1 mmol/L
SODIUM: 142

## 2015-09-25 ENCOUNTER — Non-Acute Institutional Stay (INDEPENDENT_AMBULATORY_CARE_PROVIDER_SITE_OTHER): Payer: Medicare Other | Admitting: Family Medicine

## 2015-09-25 DIAGNOSIS — F409 Phobic anxiety disorder, unspecified: Secondary | ICD-10-CM

## 2015-09-25 DIAGNOSIS — L304 Erythema intertrigo: Secondary | ICD-10-CM

## 2015-09-25 DIAGNOSIS — W19XXXD Unspecified fall, subsequent encounter: Secondary | ICD-10-CM

## 2015-09-25 DIAGNOSIS — F411 Generalized anxiety disorder: Secondary | ICD-10-CM

## 2015-09-25 DIAGNOSIS — E119 Type 2 diabetes mellitus without complications: Secondary | ICD-10-CM

## 2015-09-25 DIAGNOSIS — I10 Essential (primary) hypertension: Secondary | ICD-10-CM

## 2015-09-25 DIAGNOSIS — M1990 Unspecified osteoarthritis, unspecified site: Secondary | ICD-10-CM

## 2015-09-25 NOTE — Progress Notes (Signed)
Patient ID: [name], female   DOB: 1940/05/14, 75 y.o.   MRN: [id number] [facility]  Visit  Primary Care Provider: Rodrigo Ran, MD Location of Care: [facility] Visit Information: a scheduled routine follow-up visit Patient accompanied by no one Source(s) of information for visit: patient and EMR   Chief Complaint:     Pain: She has significant pain in her legs. She is taking Tylenol CR three times a day without significant improvement. She takes  twice daily and then 1,300mg  at 2am. She notes that her arthritis was previously well controlled, however notes that when they picked her up to transfer her to the ED on 6/7, she noted significant worsening pain in her legs bilaterally.  She can't can't take anything with ASA. She doesn't want to try tramadol. She would like more Tylenol. She notes her pain is worse around 2am. She denies any bruising, swelling, tenderness to touch, or warmth to the area.   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 She notes she doesn't eat rice and things like big sandwiches because food tends to get stuck. She denies any choking sensation as she avoids specific foods. I could not find a record of an esophageal stricture or anything like that in the EMR. I spoke with her son, [name], who notes that she often makes up problems so it is difficult for him to know what medical problems she truly had in the past.   HTN: she has had multiple complaints to other medications and has been taken off medication since that time. Her BP ranged from 158-122/82-70.   Nutrition Concerns: Patient notes she's doing well with eating.   Wound Care Nurse Concerns: none  PT Concerns and Goals: Patient does not transfer well. She endorses using her resistance bands. She can do her leg lifts.   HISTORY OF PRESENT ILLNESS: Outpatient Encounter Prescriptions as of 09/25/2015  Medication Sig  . acetaminophen (TYLENOL) 650 MG CR  tablet Take 650-1,300 mg by mouth every 8 (eight) hours as needed for pain. 650 mg  and 10 am and @ 6pm  . albuterol (PROVENTIL HFA;VENTOLIN HFA) 108 (90 BASE) MCG/ACT inhaler Inhale 2 puffs into the lungs every 6 (six) hours as needed for wheezing or shortness of breath. (Patient taking differently: Inhale 2 puffs into the lungs every 4 (four) hours as needed for wheezing or shortness of breath. )  . ALPRAZolam (XANAX) 0.25 MG tablet Take 0.25 mg by mouth every 8 (eight) hours as needed for anxiety.  . calcium citrate (CALCITRATE - DOSED IN MG ELEMENTAL CALCIUM) 950 MG tablet Take 200 mg of elemental calcium by mouth daily.  . Cholecalciferol 2000 units CAPS Take 1 capsule (2,000 Units total) by mouth daily.  . magnesium hydroxide (MILK OF MAGNESIA) 800 MG/5ML suspension Take by mouth daily as needed for constipation.  . nitroGLYCERIN (NITROSTAT) 0.4 MG SL tablet Place 0.4 mg under the tongue every 5 (five) minutes as needed for chest pain.   No facility-administered encounter medications on file as of 09/25/2015.   Allergies  Allergen Reactions  . Aspirin Other (See Comments)    Bleeding   . Latex Itching  . Penicillins Rash    Has patient had a PCN reaction causing immediate rash, facial/tongue/throat swelling, SOB or lightheadedness with hypotension: No Has patient had a PCN reaction causing severe rash involving mucus membranes or skin necrosis: No Has patient had a PCN reaction that required hospitalization No Has patient had a  PCN reaction occurring within the last 10 years: No If all of the above answers are "NO", then may proceed with Cephalosporin use.    History Patient Active Problem List   Diagnosis Date Noted  . Anxiety state 09/30/2015  . Weight gain 08/24/2015  . Pruritic intertrigo 07/24/2015  . Hypercalcemia 05/18/2015  . Osteoarthritis 03/11/2015  . Anemia 03/11/2015  . Bone spur of left foot   . 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 Person living in residential  institution 12/03/2014  . Diabetes mellitus without complication (HCC) 11/29/2014  . Hyperlipemia   . Essential hypertension   . Fall   . Solitary pulmonary nodule, possible 08/29/2014   Past Medical History  Diagnosis Date  . Hypertension   . Diabetes mellitus   . Obesity   . Hyperlipemia   . Osteoarthritis   . Gout   . Depression   . Hemorrhoid   . Iron deficiency anemia secondary to blood loss (chronic)   . 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 UTI (lower urinary tract infection) 11/29/2014  . Essential hypertension   . Diabetes mellitus without complication (HCC) 11/29/2014  . Person living in residential institution 12/03/2014  . Fall   . Bone spur of left foot   . Solitary pulmonary nodule 01/07/2015  . Hypercalcemia 05/18/2015   Past Surgical History  Procedure Laterality Date  . Knee surgery Right    Family History  Problem Relation Age of Onset  . Heart attack Mother   . Diabetes Father     reports that she has quit smoking. She does not have any smokeless tobacco history on file.    Diet:  mechanical soft  Review of Systems  Patient has ability to communicate answers to ROS: yes See HPI  Geriatric Syndromes: Constipation no   Incontinence yes, wears adult diapers Dizziness no   Syncope no   Skin problems no   Visual Impairment no   Hearing impairment yes (stable) Eating impairment yes- dentures, poor dentition, endorses dysphagia with specific foods as above.  Impaired Memory or Cognition: no   Behavioral problems no, although noted to have some anxiety requiring a ED trip recently.  Sleep problems no   Weight loss no    Pain:  Pain Location: in her legs bilaterally  Pain Rating: 4/10  Pain Duration: worsened since her ED visit  Pain Therapies: Tylenol Pain Response to Therapies: mild Bowel Movement Difficulty: no  Dyspnea: Dyspnea Rating: denies currently Dyspnea Goal: none Dyspnea Therapies: albuterol PRN Dyspnea Response to Therapies:  good   General: Denies fevers, chills, progressive fatigue, weight gain.  Eyes: Denies pain, blurred vision  Ears/Nose/Throat: Denies ear pain, throat pain, rhinorrhea, nasal congestion.  Cardiovascular: Denies chest pains, palpitations, dyspnea on exertion, orthopnea, peripheral edema.  Respiratory: Denies cough, sputum, dyspnea  Gastrointestinal: Denies abdominal pain, bloating, constipation, diarrhea.  Genitourinary: Denies dysuria, urinary frequency, discharge Musculoskeletal: Endorses joint pain in the LE. Denies swelling, weakness.  Skin: Denies skin rash or ulcers. Neurologic: Denies transient paralysis, weakness, paresthesias, headache.  Psychiatric: Denies depression, anxiety, psychosis.   Endocrine: Denies weight loss   PHYSICAL EXAM:. 09/25/15: 154/89, O295%, pulse 84, Temp 98.65F   Weight 09/22/15: 172lbs    General: alert, cooperative, very talkative, well nourished, pleasant, clean, groomed.  HEENT:  No scleral icterus, no nasal secretions, Oral mucosa moist and no erythema or lesion. Dentures on top, poor dentition on bottom.  Neck:  Supple, No JVD, no lymphadenopathy CV:  RRR, no murmur RESP: No resp distress or  accessory muscle use.  Clear to ausc bilat. No wheezing, no rales, no rhonchi.  ABD:  Soft, Non-tender, non-distended, +bowel sounds. Easily reducible umbilical hernia.  MSK:  No back pain. Patient notes significant pain with palpation over the anterior shin and calves bilaterally.  No size discrepancy noted. No bruising, ecchymoses, or warmth noted.  EXT: Warm and well perfused. Pulses WNL. Trace edema in the ankles.   Gait:  Not tested Skin:No rashes or lesions noted.  Neurologic:Cranial nerves normal besides decreased hearing;  Muscle Tone normal with some muscle atrophy.  Motor Strength: generalized weakness, 4+/5 strength in the hips bilaterally.   Psych:  Orientation: oriented to person, place, time, and general circumstances; Judgment Good. Insight Intact  Memory recent and remote memory intact; Attention Normal;  Mood appropriate; Speech normal;  Thought Coherent   Assessment and Plan:   See Problem List for individual problem's assessment and plans.    Code Status:     FULL CODE per discussion with patient. Initially she discussed not wanting to be hooked up to machines, however seemed slightly confused about her exact wishes. Her son, Lynn ClicheBobby, notes she's stated in the past she would be ok with being placed on a ventilator intermittently, however would not want to be attached to it for an extended period of time. He notes she intermittently gets confused. Per his request and his noting of her wishes, will keep her a FULL code.

## 2015-09-25 NOTE — Assessment & Plan Note (Signed)
BPs fairly controlled off antihypertensives. Not currently at goal per JNC 8 guidelines given her h/o DM, however given her significant side effects will continue to hold all antihypertensives.  - continue checking BPs q2 weeks

## 2015-09-25 NOTE — Assessment & Plan Note (Signed)
Adequate glycemic control off all pharmacologic interventions.  - continue with podiatry. - no on aspirin due to a h/o GI bleed. - off ARB now due to side effects.  - continue to check CBGs weekly.

## 2015-09-30 DIAGNOSIS — F411 Generalized anxiety disorder: Secondary | ICD-10-CM | POA: Insufficient documentation

## 2015-09-30 NOTE — Assessment & Plan Note (Signed)
Worsening since her last ED visit, however some of her tenderness is not directly over the joints which is interesting. No calf size discrepency concerning for DVT (pt is immobile). - will increase Tylenol so that she takes 1,300mg  at 6pm (increase in dose) and 2am and continue 650mg  at 10am.  - pt is aware this is a short term change over the next 1-2 weeks, we will then go back to 650mg  twice daily and then 1,300mg  at 2am.

## 2015-09-30 NOTE — Assessment & Plan Note (Deleted)
Still doing exercises in bed, refuses to participate in transfers 

## 2015-09-30 NOTE — Assessment & Plan Note (Signed)
Resolved on my exam today.

## 2015-09-30 NOTE — Assessment & Plan Note (Signed)
Still doing exercises in bed, refuses to participate in transfers

## 2015-09-30 NOTE — Assessment & Plan Note (Signed)
Patient sent to the emergency room since her last visit after getting worked up and noted to have an elevated blood pressure. Chest pain rule out with performed in the emergency room and she was discharged back to nursing home. Psychiatry has seen her twice since that time. They have suggested Xanax as needed for anxiety. The patient dislikes the idea of a medication such as this which make calls a change in her sensorium.  We will continue to monitor at this time.

## 2015-09-30 NOTE — Assessment & Plan Note (Signed)
Repeat calcium normal at 9.6, however unsure what her protein status is (she may still be hypercalcemic after correction). In general, this appears to be improving.

## 2015-10-06 ENCOUNTER — Encounter: Payer: Self-pay | Admitting: Family Medicine

## 2015-10-30 ENCOUNTER — Encounter: Payer: Self-pay | Admitting: Family Medicine

## 2015-10-30 LAB — HEMOGLOBIN A1C: HEMOGLOBIN A1C: 5.7

## 2015-11-06 ENCOUNTER — Encounter: Payer: Self-pay | Admitting: Pharmacist

## 2015-11-09 ENCOUNTER — Encounter: Payer: Self-pay | Admitting: Pharmacist

## 2015-11-25 ENCOUNTER — Non-Acute Institutional Stay: Payer: Medicare Other | Admitting: Family Medicine

## 2015-11-25 DIAGNOSIS — L89600 Pressure ulcer of unspecified heel, unstageable: Secondary | ICD-10-CM

## 2015-11-25 DIAGNOSIS — I10 Essential (primary) hypertension: Secondary | ICD-10-CM

## 2015-11-25 DIAGNOSIS — M1990 Unspecified osteoarthritis, unspecified site: Secondary | ICD-10-CM | POA: Diagnosis not present

## 2015-11-25 DIAGNOSIS — R300 Dysuria: Secondary | ICD-10-CM

## 2015-11-25 DIAGNOSIS — E119 Type 2 diabetes mellitus without complications: Secondary | ICD-10-CM

## 2015-11-25 DIAGNOSIS — F411 Generalized anxiety disorder: Secondary | ICD-10-CM

## 2015-11-25 DIAGNOSIS — L89150 Pressure ulcer of sacral region, unstageable: Secondary | ICD-10-CM

## 2015-11-25 NOTE — Progress Notes (Signed)
Patient ID: [name], female   DOB: 12-Jun-1940, 75 y.o.   MRN: [id number] [facility]  Visit  Primary Care Provider: Rodrigo Ran, MD Location of Care: [facility] Visit Information: a scheduled routine follow-up visit Patient accompanied by no one Source(s) of information for visit: patient and EMR   Chief Complaint:     Pain: She has significant pain in her legs and her "bottom". She is taking Tylenol CR three times a day without significant improvement. She takes 650mg  twice daily and then 1,300mg  at 2am. She was maxed out on this, therefore tramadol PRN was added. She notes she did not like this and "never wants it near her" again. She notes it made her "crazy."   Dysuria: patient notes burning for the last week. She is continent of urine therefore is unsure if is with urination or after urination.   She feels she may be urinating more frequently and noted a malodorous smell to the urine.  She denies fevers, chills, nausea, vomiting.   Nutrition Concerns: Patient notes she's doing well with eating.   Wound Care Nurse Concerns: none  PT Concerns and Goals: Patient does not transfer easily, she requires a lot of assistance. She endorses using her resistance bands  HISTORY OF PRESENT ILLNESS: Outpatient Encounter Prescriptions as of 11/25/2015  Medication Sig  . acetaminophen (TYLENOL) 650 MG CR tablet Take 650 mg by mouth 4 (four) times daily.   Marland Kitchen albuterol (PROVENTIL HFA;VENTOLIN HFA) 108 (90 BASE) MCG/ACT inhaler Inhale 2 puffs into the lungs every 6 (six) hours as needed for wheezing or shortness of breath. (Patient taking differently: Inhale 2 puffs into the lungs every 4 (four) hours as needed for wheezing or shortness of breath. )  . ALPRAZolam (XANAX) 0.25 MG tablet Take 0.25 mg by mouth every 8 (eight) hours as needed for anxiety.  . calcium citrate (CALCITRATE - DOSED IN MG ELEMENTAL CALCIUM) 950 MG tablet Take 200 mg of elemental calcium by mouth daily.   . Cholecalciferol 2000 units CAPS Take 1 capsule (2,000 Units total) by mouth daily.  . magnesium hydroxide (MILK OF MAGNESIA) 800 MG/5ML suspension Take by mouth daily as needed for constipation.  . nitroGLYCERIN (NITROSTAT) 0.4 MG SL tablet Place 0.4 mg under the tongue every 5 (five) minutes as needed for chest pain.   No facility-administered encounter medications on file as of 11/25/2015.    Allergies  Allergen Reactions  . Aspirin Other (See Comments)    Bleeding   . Latex Itching  . Penicillins Rash    Has patient had a PCN reaction causing immediate rash, facial/tongue/throat swelling, SOB or lightheadedness with hypotension: No Has patient had a PCN reaction causing severe rash involving mucus membranes or skin necrosis: No Has patient had a PCN reaction that required hospitalization No Has patient had a PCN reaction occurring within the last 10 years: No If all of the above answers are "NO", then may proceed with Cephalosporin use.    History Patient Active Problem List   Diagnosis Date Noted  . Dysuria 11/30/2015  . Anxiety state 09/30/2015  . Weight gain 08/24/2015  . Pruritic intertrigo 07/24/2015  . Hypercalcemia 05/18/2015  . Osteoarthritis 03/11/2015  . Anemia 03/11/2015  . Bone spur of left foot   . 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 Person living in residential institution 12/03/2014  . Diabetes mellitus without complication (HCC) 11/29/2014  . Hyperlipemia   . Essential hypertension   . Fall   . Solitary pulmonary nodule,  possible 08/29/2014   Past Medical History:  Diagnosis Date  . Bone spur of left foot   . Depression   . Diabetes mellitus   . Diabetes mellitus without complication (HCC) 11/29/2014  . Essential hypertension   . Fall   . 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 Gout   . Hemorrhoid   . Hypercalcemia 05/18/2015  . Hyperlipemia   . Hypertension   . Iron deficiency anemia secondary to blood loss (chronic)   . Obesity   . Osteoarthritis   . Person  living in residential institution 12/03/2014  . Solitary pulmonary nodule 01/07/2015  . UTI (lower urinary tract infection) 11/29/2014   Past Surgical History:  Procedure Laterality Date  . KNEE SURGERY Right    Family History  Problem Relation Age of Onset  . Heart attack Mother   . Diabetes Father     reports that she has quit smoking. She does not have any smokeless tobacco history on file.    Diet:  mechanical soft  Review of Systems  Patient has ability to communicate answers to ROS: yes See HPI  Geriatric Syndromes: Constipation no   Incontinence yes, wears adult diapers Dizziness no   Syncope no   Skin problems no   Visual Impairment no   Hearing impairment yes (stable) Eating impairment yes- dentures, poor dentition Impaired Memory or Cognition: no, attention is poor and cannot perform MOCA.    Behavioral problems no Sleep problems no   Weight loss no    Pain:  Pain Location: in her legs bilaterally  Pain Rating: 5/10  Pain Duration: over the last 4-5 months Pain Therapies: Tylenol, tramadol Pain Response to Therapies: mild Bowel Movement Difficulty: no  Dyspnea: Dyspnea Rating: denies currently Dyspnea Goal: none Dyspnea Therapies: albuterol PRN Dyspnea Response to Therapies: good   General: Denies fevers, chills, progressive fatigue, weight gain.  Eyes: Denies pain, blurred vision  Ears/Nose/Throat: Denies ear pain, throat pain, rhinorrhea, nasal congestion.  Cardiovascular: Denies chest pains, palpitations, dyspnea on exertion, orthopnea, peripheral edema.  Respiratory: Denies cough, sputum, dyspnea  Gastrointestinal: Denies abdominal pain, bloating, constipation, diarrhea.  Genitourinary: Denies discharge Musculoskeletal: Endorses joint pain in the LE. Denies swelling, weakness.  Skin: Denies skin rash or ulcers. Neurologic: Denies transient paralysis, weakness, paresthesias, headache.  Psychiatric: Denies depression, anxiety, psychosis.    Endocrine: Denies weight loss   PHYSICAL EXAM:. BPs: 145/80, 154/80, 187/111, 132/68, 148/80, 138/78  Weight: 172.2lbs  Temp 97.17F  General: alert, cooperative, very talkative, well nourished, pleasant, clean, groomed.  HEENT:  No scleral icterus, no nasal secretions, Oral mucosa moist and no erythema or lesion. Dentures on top, poor dentition on bottom.  Neck:  Supple, No JVD, no lymphadenopathy CV:  RRR, no murmur RESP: No resp distress or accessory muscle use.  Clear to ausc bilat. No wheezing, no rales, no rhonchi.  ABD:  Soft, non-distended, +bowel sounds. Mild suprapubic pain, no rebound or guarding.  Easily reducible umbilical hernia. No CVA tenderness.  MSK:  No back pain.  EXT: Warm and well perfused. Pulses WNL. Trace edema in the ankles.   Gait:  Not tested Skin:No rashes or lesions noted.  Genitalia: with a chaperone present. Wet. Malodorous. Mild erythema of the labia majora but no satellite lesions noted.  Neurologic:Cranial nerves normal besides decreased hearing;  Muscle Tone normal with some muscle atrophy.  Motor Strength: generalized weakness, 4+/5 strength in the hips bilaterally.   Psych:  Orientation: oriented to person, place, time, and general circumstances; Judgment Good. Insight  Intact Memory recent and remote memory intact; Attention Normal;  Mood appropriate; Speech normal;  Thought Coherent   Assessment and Plan:   See Problem List for individual problem's assessment and plans.    Code Status:     FULL CODE per discussion with patient and family.  ________________________________________________________________________________________________________________________________________________________________________________________________________________________________________________________________________________________________  ATTENDING PHYSICIAN REGULATORY VISIT NOTE  Primary Care Provider: Rodrigo Ranrystal Maryah Marinaro, MD Location of Care: [facility] Visit Information: a scheduled routine follow-up visit Patient accompanied by no one Source(s) of information for visit: patient and EMR   Chief Complaint:     HISTORY OF PRESENT ILLNESS:  Heel Pressure Ulcer, Unstageable  Sacral Pressure Ulcer, Unstageable Outpatient Encounter Prescriptions as of 11/25/2015  Medication Sig  . acetaminophen (TYLENOL) 650 MG CR tablet Take 650 mg by mouth 4 (four) times daily.   Marland Kitchen. albuterol (PROVENTIL HFA;VENTOLIN HFA) 108 (90 BASE) MCG/ACT inhaler Inhale 2 puffs into the lungs every 6 (six) hours as needed for wheezing or shortness of breath. (Patient taking differently: Inhale 2 puffs into the lungs every 4 (four) hours as needed for wheezing or shortness of breath. )  . ALPRAZolam (XANAX) 0.25 MG tablet Take 0.25 mg by mouth every 8 (eight) hours as needed for anxiety.  . calcium citrate (CALCITRATE - DOSED IN MG ELEMENTAL CALCIUM) 950 MG tablet Take 200 mg of elemental calcium by mouth daily.  . Cholecalciferol 2000 units CAPS Take 1 capsule (2,000 Units total) by mouth daily.  . magnesium hydroxide (MILK OF MAGNESIA) 800 MG/5ML suspension Take by mouth daily as needed for constipation.  . nitroGLYCERIN (NITROSTAT) 0.4 MG SL tablet Place 0.4 mg under the tongue every 5 (five) minutes as needed for chest pain.   No facility-administered encounter medications on file as of 11/25/2015.    Allergies  Allergen Reactions  . Aspirin Other (See Comments)    Bleeding   . Latex Itching  . Penicillins Rash    Has patient had a PCN reaction causing immediate rash, facial/tongue/throat swelling, SOB or lightheadedness with hypotension: No Has patient had a PCN reaction causing severe rash involving mucus membranes or skin necrosis: No Has patient had a PCN reaction that required hospitalization No Has patient had a PCN reaction occurring within the last 10 years: No If all of the above answers are "NO", then may proceed with Cephalosporin use.     History Patient Active Problem List   Diagnosis Date Noted  . Dysuria 11/30/2015  . Anxiety state 09/30/2015  . Weight gain 08/24/2015  . Pruritic intertrigo 07/24/2015  . Hypercalcemia 05/18/2015  . Osteoarthritis 03/11/2015  . Anemia 03/11/2015  . Bone spur of left foot   . 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 Person living in residential institution 12/03/2014  . Diabetes mellitus without complication (HCC) 11/29/2014  . Hyperlipemia   . Essential hypertension   . Fall   . Solitary pulmonary nodule, possible 08/29/2014   Past Medical History:  Diagnosis Date  . Bone spur of left foot   . Depression   . Diabetes mellitus   . Diabetes mellitus without complication (HCC) 11/29/2014  . Essential hypertension   . Fall   . 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 Gout   . Hemorrhoid   . Hypercalcemia 05/18/2015  . Hyperlipemia   . Hypertension   . Iron deficiency anemia secondary to blood loss (chronic)   . Obesity   . Osteoarthritis   . Person living in residential institution 12/03/2014  . Solitary pulmonary nodule 01/07/2015  .  UTI (lower urinary tract infection) 11/29/2014   Past Surgical History:  Procedure Laterality Date  . KNEE SURGERY Right    Family History  Problem Relation Age of Onset  . Heart attack Mother   . Diabetes Father    She does not have any smokeless tobacco history on file.    Diet:  mechanical soft  Review of Systems  Patient has ability to communicate answers to ROS: yes See HPI  Geriatric Syndromes: Constipation no   Incontinence yes, wears adult diapers Dizziness no   Syncope no   Skin problems no   Visual Impairment no   Hearing impairment yes (stable) Eating impairment yes- dentures, poor dentition Impaired Memory or Cognition: no, attention is poor and cannot perform MOCA.    Behavioral problems no Sleep problems no   Weight loss no    Pain:  Pain Location: in her legs bilaterally  Pain Rating: 5/10  Pain Duration: over the last 4-5  months Pain Therapies: Tylenol, tramadol Pain Response to Therapies: mild Bowel Movement Difficulty: no  Dyspnea: Dyspnea Rating: denies currently Dyspnea Goal: none Dyspnea Therapies: albuterol PRN Dyspnea Response to Therapies: good   General: Denies fevers, chills, progressive fatigue, weight gain.  Eyes: Denies pain, blurred vision  Ears/Nose/Throat: Denies ear pain, throat pain, rhinorrhea, nasal congestion.  Cardiovascular: Denies chest pains, palpitations, dyspnea on exertion, orthopnea, peripheral edema.  Respiratory: Denies cough, sputum, dyspnea  Gastrointestinal: Denies abdominal pain, bloating, constipation, diarrhea.  Genitourinary: Denies discharge Musculoskeletal: Endorses joint pain in the LE. Denies swelling, weakness.  Skin: Denies skin rash or ulcers. Neurologic: Denies transient paralysis, weakness, paresthesias, headache.  Psychiatric: Denies depression, anxiety, psychosis.   Endocrine: Denies weight loss   PHYSICAL EXAM:. BPs: 145/80, 154/80, 187/111, 132/68, 148/80, 138/78  Weight: 172.2lbs  Temp 97.41F     Assessment and Plan:    1. Heel Pressure Ulcer - Pressure relief with heel suspension off mattress and Prevalon boots -    Code Status:     FULL CODE per discussion with patient and family.

## 2015-11-30 DIAGNOSIS — R300 Dysuria: Secondary | ICD-10-CM | POA: Insufficient documentation

## 2015-11-30 NOTE — Assessment & Plan Note (Signed)
No spells recently. Has Xanax PRN but has not needed this.

## 2015-11-30 NOTE — Assessment & Plan Note (Signed)
Stable over the last few weeks. Due to continued pain and being maxed out on Tylenol, tramadol was added. Pt did not tolerate Tylenol. - continue to monitor.

## 2015-11-30 NOTE — Assessment & Plan Note (Signed)
Patient with concerns for UTI vs candidiasis, however no satellite lesions note on exam, just mild erythema which could be secondary to irritation from staying wet. - obtained U/A and urine culture, noted that I&O could be performed if needed as patient is incontinent. - if no evidence of UTI, would treat with a 1 time dose of diflucan.  - dicussed this with geriatric resident, Dr. Doroteo GlassmanPhelps.

## 2015-11-30 NOTE — Assessment & Plan Note (Signed)
Adequate glycemic control off medications. CBGs well controlled. - continue to check CBGs weekly - continue to see podiatry - no ASA due to a h/o GI bleed - off ARB due to side effects

## 2015-11-30 NOTE — Assessment & Plan Note (Signed)
BPs fairly well controlled off antihypertensives secondary to side effects. Intermittently at goal per JNC 8 guidelines.  - continue to monitor BPs

## 2015-12-11 ENCOUNTER — Encounter: Payer: Self-pay | Admitting: Pharmacist

## 2015-12-23 ENCOUNTER — Other Ambulatory Visit: Payer: Self-pay | Admitting: Family Medicine

## 2015-12-23 MED ORDER — OXYCODONE HCL 5 MG PO TABS: 2.5000 mg | ORAL_TABLET | Freq: Four times a day (QID) | ORAL | 0 refills | Status: DC | PRN

## 2016-01-10 ENCOUNTER — Telehealth: Payer: Self-pay | Admitting: Family Medicine

## 2016-01-10 NOTE — Telephone Encounter (Addendum)
**  After Hours/ Emergency Line Call*  Received a call to report that [name] is having wheezes and hypoxia to 86-91% on room air.  She notes that patient was seen 01/05/16 for wheeze.  CXR obtained 10/17 which was unremarkable.  She has a standing order for Robitussin since 01/06/2016 for cough.  Endorsing global inspiratory and expiratory wheeze.  Denying fevers.  Temp 98.71F.  No history of COPD, heart failure or asthma.    Physical exam: HR 104, O2 saturation 94% on 2L , RR 17, BP 170/88, T 98.71F Gen: disheveled, elderly female, lying in bed, unclothed Neck: no JVD Cardio: tachycardic, rhythm regular, no murmurs appreciated Pulm: slight expiratory wheeze appreciated in the Left base, otherwise, CTAB, good air movement, no rhonchi or rales.  Some accessory muscle use, RR slightly increased. Ext: no edema, poor LE tone, calves symmetric  [name] is a [age] y.o. female with acute hypoxia.  She has NO history of CHF, COPD or asthma.  She has been afebrile but has had a persistent cough for the last several days, so possible infectious etiology.  Wells score 3 (given tachycardia and bed ridden state must consider PE).  No evidence of fluid overload on exam, so doubt CHF.  Also to consider possible MI (although no CP, N/V, diaphoresis). Some wheeze on exam.  Could be reactive in the setting of URI.  - Stat 2v CXR - Stat 12-lead EKG - Stat CMP, CBC, Troponin, DDimer - Duoneb now and q4 prn - Continue supplemental O2 for saturations <92%, wean as tolerated - Per Rene Kocheregina, Charity fundraiserN.  Patient does not wish for ED evaluation.  Though, I recommended this if patient continues to decompensate.  She agrees to transfer to ED if she worsens.  Rest of plan pending results of above.  Will forward to Dr Jimmey RalphParker, geriatric resident, and Dr McDiarmid, geriatric attending.  Ashly M. Nadine CountsGottschalk, DO PGY-3, [facility]

## 2016-01-11 ENCOUNTER — Telehealth: Payer: Self-pay | Admitting: Family Medicine

## 2016-01-11 ENCOUNTER — Inpatient Hospital Stay (HOSPITAL_COMMUNITY)
Admission: EM | Admit: 2016-01-11 | Discharge: 2016-01-13 | DRG: 175 | Disposition: A | Discharge status: Skilled Nursing Facility | Payer: Medicare Other | Attending: Family Medicine | Admitting: Family Medicine

## 2016-01-11 ENCOUNTER — Encounter (HOSPITAL_COMMUNITY): Payer: Self-pay | Admitting: Emergency Medicine

## 2016-01-11 ENCOUNTER — Emergency Department (HOSPITAL_COMMUNITY): Payer: Medicare Other

## 2016-01-11 ENCOUNTER — Inpatient Hospital Stay (HOSPITAL_COMMUNITY): Payer: Medicare Other

## 2016-01-11 DIAGNOSIS — E11628 Type 2 diabetes mellitus with other skin complications: Secondary | ICD-10-CM | POA: Diagnosis present

## 2016-01-11 DIAGNOSIS — Z7722 Contact with and (suspected) exposure to environmental tobacco smoke (acute) (chronic): Secondary | ICD-10-CM | POA: Diagnosis present

## 2016-01-11 DIAGNOSIS — I2721 Secondary pulmonary arterial hypertension: Secondary | ICD-10-CM | POA: Diagnosis present

## 2016-01-11 DIAGNOSIS — R7303 Prediabetes: Secondary | ICD-10-CM | POA: Diagnosis present

## 2016-01-11 DIAGNOSIS — L8995 Pressure ulcer of unspecified site, unstageable: Secondary | ICD-10-CM | POA: Diagnosis not present

## 2016-01-11 DIAGNOSIS — I2699 Other pulmonary embolism without acute cor pulmonale: Secondary | ICD-10-CM | POA: Diagnosis present

## 2016-01-11 DIAGNOSIS — Z833 Family history of diabetes mellitus: Secondary | ICD-10-CM | POA: Diagnosis not present

## 2016-01-11 DIAGNOSIS — J189 Pneumonia, unspecified organism: Secondary | ICD-10-CM | POA: Diagnosis present

## 2016-01-11 DIAGNOSIS — F411 Generalized anxiety disorder: Secondary | ICD-10-CM | POA: Diagnosis present

## 2016-01-11 DIAGNOSIS — Z79899 Other long term (current) drug therapy: Secondary | ICD-10-CM | POA: Diagnosis not present

## 2016-01-11 DIAGNOSIS — R05 Cough: Secondary | ICD-10-CM | POA: Diagnosis present

## 2016-01-11 DIAGNOSIS — I471 Supraventricular tachycardia: Secondary | ICD-10-CM | POA: Diagnosis present

## 2016-01-11 DIAGNOSIS — Z8249 Family history of ischemic heart disease and other diseases of the circulatory system: Secondary | ICD-10-CM

## 2016-01-11 DIAGNOSIS — E11621 Type 2 diabetes mellitus with foot ulcer: Secondary | ICD-10-CM | POA: Diagnosis present

## 2016-01-11 DIAGNOSIS — R410 Disorientation, unspecified: Secondary | ICD-10-CM | POA: Diagnosis present

## 2016-01-11 DIAGNOSIS — E119 Type 2 diabetes mellitus without complications: Secondary | ICD-10-CM

## 2016-01-11 DIAGNOSIS — L89153 Pressure ulcer of sacral region, stage 3: Secondary | ICD-10-CM | POA: Diagnosis present

## 2016-01-11 DIAGNOSIS — R918 Other nonspecific abnormal finding of lung field: Secondary | ICD-10-CM | POA: Diagnosis present

## 2016-01-11 DIAGNOSIS — R0902 Hypoxemia: Secondary | ICD-10-CM | POA: Diagnosis present

## 2016-01-11 DIAGNOSIS — R911 Solitary pulmonary nodule: Secondary | ICD-10-CM | POA: Diagnosis present

## 2016-01-11 DIAGNOSIS — I712 Thoracic aortic aneurysm, without rupture, unspecified: Secondary | ICD-10-CM | POA: Diagnosis present

## 2016-01-11 DIAGNOSIS — D649 Anemia, unspecified: Secondary | ICD-10-CM | POA: Diagnosis present

## 2016-01-11 DIAGNOSIS — Z87891 Personal history of nicotine dependence: Secondary | ICD-10-CM | POA: Diagnosis not present

## 2016-01-11 DIAGNOSIS — I2609 Other pulmonary embolism with acute cor pulmonale: Secondary | ICD-10-CM | POA: Diagnosis present

## 2016-01-11 DIAGNOSIS — M1990 Unspecified osteoarthritis, unspecified site: Secondary | ICD-10-CM | POA: Diagnosis present

## 2016-01-11 DIAGNOSIS — N390 Urinary tract infection, site not specified: Secondary | ICD-10-CM | POA: Diagnosis present

## 2016-01-11 DIAGNOSIS — I10 Essential (primary) hypertension: Secondary | ICD-10-CM | POA: Diagnosis present

## 2016-01-11 DIAGNOSIS — F0390 Unspecified dementia without behavioral disturbance: Secondary | ICD-10-CM | POA: Diagnosis present

## 2016-01-11 DIAGNOSIS — L89890 Pressure ulcer of other site, unstageable: Secondary | ICD-10-CM | POA: Diagnosis present

## 2016-01-11 DIAGNOSIS — E785 Hyperlipidemia, unspecified: Secondary | ICD-10-CM | POA: Diagnosis present

## 2016-01-11 DIAGNOSIS — L89610 Pressure ulcer of right heel, unstageable: Secondary | ICD-10-CM | POA: Diagnosis present

## 2016-01-11 DIAGNOSIS — N3000 Acute cystitis without hematuria: Secondary | ICD-10-CM

## 2016-01-11 DIAGNOSIS — Y95 Nosocomial condition: Secondary | ICD-10-CM | POA: Diagnosis present

## 2016-01-11 DIAGNOSIS — L97419 Non-pressure chronic ulcer of right heel and midfoot with unspecified severity: Secondary | ICD-10-CM | POA: Diagnosis present

## 2016-01-11 DIAGNOSIS — I4719 Other supraventricular tachycardia: Secondary | ICD-10-CM | POA: Diagnosis present

## 2016-01-11 DIAGNOSIS — Z789 Other specified health status: Secondary | ICD-10-CM

## 2016-01-11 DIAGNOSIS — J154 Pneumonia due to other streptococci: Secondary | ICD-10-CM | POA: Diagnosis present

## 2016-01-11 DIAGNOSIS — Z593 Problems related to living in residential institution: Secondary | ICD-10-CM

## 2016-01-11 HISTORY — DX: Supraventricular tachycardia: I47.1

## 2016-01-11 HISTORY — DX: Thoracic aortic aneurysm, without rupture: I71.2

## 2016-01-11 HISTORY — DX: Secondary pulmonary arterial hypertension: I27.21

## 2016-01-11 HISTORY — DX: Other nonspecific abnormal finding of lung field: R91.8

## 2016-01-11 HISTORY — DX: Thoracic aortic aneurysm, without rupture, unspecified: I71.20

## 2016-01-11 HISTORY — DX: Prediabetes: R73.03

## 2016-01-11 LAB — CBC WITH DIFFERENTIAL/PLATELET
BASOS ABS: 0 10*3/uL (ref 0.0–0.1)
BASOS PCT: 0 %
EOS ABS: 0.1 10*3/uL (ref 0.0–0.7)
Eosinophils Relative: 0 %
HCT: 38.7 % (ref 36.0–46.0)
HEMOGLOBIN: 12.3 g/dL (ref 12.0–15.0)
Lymphocytes Relative: 15 %
Lymphs Abs: 2.4 10*3/uL (ref 0.7–4.0)
MCH: 30.8 pg (ref 26.0–34.0)
MCHC: 31.8 g/dL (ref 30.0–36.0)
MCV: 96.8 fL (ref 78.0–100.0)
MONOS PCT: 7 %
Monocytes Absolute: 1.1 10*3/uL — ABNORMAL HIGH (ref 0.1–1.0)
NEUTROS PCT: 78 %
Neutro Abs: 13.1 10*3/uL — ABNORMAL HIGH (ref 1.7–7.7)
Platelets: 460 10*3/uL — ABNORMAL HIGH (ref 150–400)
RBC: 4 MIL/uL (ref 3.87–5.11)
RDW: 14.8 % (ref 11.5–15.5)
WBC: 16.7 10*3/uL — AB (ref 4.0–10.5)

## 2016-01-11 LAB — HEMOGLOBIN A1C
HEMOGLOBIN A1C: 5.7 % — AB (ref 4.8–5.6)
Mean Plasma Glucose: 117 mg/dL

## 2016-01-11 LAB — BLOOD GAS, ARTERIAL
Acid-Base Excess: 7.7 mmol/L — ABNORMAL HIGH (ref 0.0–2.0)
Bicarbonate: 32.4 mmol/L — ABNORMAL HIGH (ref 20.0–28.0)
DRAWN BY: 441371
O2 Content: 3 L/min
O2 Saturation: 94.4 %
PATIENT TEMPERATURE: 98.2
PH ART: 7.419 (ref 7.350–7.450)
pCO2 arterial: 50.9 mmHg — ABNORMAL HIGH (ref 32.0–48.0)
pO2, Arterial: 73 mmHg — ABNORMAL LOW (ref 83.0–108.0)

## 2016-01-11 LAB — COMPREHENSIVE METABOLIC PANEL
ALK PHOS: 85 U/L (ref 38–126)
ALT: 14 U/L (ref 14–54)
ANION GAP: 11 (ref 5–15)
AST: 17 U/L (ref 15–41)
Albumin: 2.4 g/dL — ABNORMAL LOW (ref 3.5–5.0)
BILIRUBIN TOTAL: 0.7 mg/dL (ref 0.3–1.2)
BUN: 20 mg/dL (ref 6–20)
CALCIUM: 9.8 mg/dL (ref 8.9–10.3)
CO2: 32 mmol/L (ref 22–32)
CREATININE: 0.53 mg/dL (ref 0.44–1.00)
Chloride: 96 mmol/L — ABNORMAL LOW (ref 101–111)
Glucose, Bld: 124 mg/dL — ABNORMAL HIGH (ref 65–99)
Potassium: 4.1 mmol/L (ref 3.5–5.1)
Sodium: 139 mmol/L (ref 135–145)
TOTAL PROTEIN: 6.3 g/dL — AB (ref 6.5–8.1)

## 2016-01-11 LAB — URINALYSIS, ROUTINE W REFLEX MICROSCOPIC
BILIRUBIN URINE: NEGATIVE
Glucose, UA: NEGATIVE mg/dL
Hgb urine dipstick: NEGATIVE
Ketones, ur: NEGATIVE mg/dL
LEUKOCYTES UA: NEGATIVE
NITRITE: NEGATIVE
Protein, ur: 100 mg/dL — AB
SPECIFIC GRAVITY, URINE: 1.022 (ref 1.005–1.030)
pH: 5.5 (ref 5.0–8.0)

## 2016-01-11 LAB — TROPONIN I: Troponin I: 0.07 ng/mL (ref <0.03)

## 2016-01-11 LAB — URINE MICROSCOPIC-ADD ON

## 2016-01-11 LAB — GLUCOSE, CAPILLARY
GLUCOSE-CAPILLARY: 182 mg/dL — AB (ref 65–99)
Glucose-Capillary: 194 mg/dL — ABNORMAL HIGH (ref 65–99)

## 2016-01-11 LAB — MRSA PCR SCREENING: MRSA by PCR: NEGATIVE

## 2016-01-11 LAB — STREP PNEUMONIAE URINARY ANTIGEN: STREP PNEUMO URINARY ANTIGEN: NEGATIVE

## 2016-01-11 LAB — D-DIMER, QUANTITATIVE (NOT AT ARMC): D DIMER QUANT: 1.17 ug{FEU}/mL — AB (ref 0.00–0.50)

## 2016-01-11 LAB — LACTIC ACID, PLASMA: LACTIC ACID, VENOUS: 1.2 mmol/L (ref 0.5–1.9)

## 2016-01-11 MED ORDER — DEXTROSE 5 % IV SOLN
2.0000 g | Freq: Once | INTRAVENOUS | Status: AC
  Administered 2016-01-11: 2 g via INTRAVENOUS
  Filled 2016-01-11: qty 2

## 2016-01-11 MED ORDER — LEVOFLOXACIN IN D5W 750 MG/150ML IV SOLN
750.0000 mg | INTRAVENOUS | Status: DC
  Administered 2016-01-11: 750 mg via INTRAVENOUS
  Filled 2016-01-11 (×2): qty 150

## 2016-01-11 MED ORDER — ENOXAPARIN SODIUM 40 MG/0.4ML ~~LOC~~ SOLN: 40.0000 mg | SUBCUTANEOUS | Status: DC

## 2016-01-11 MED ORDER — ACETAMINOPHEN 650 MG RE SUPP: 650.0000 mg | Freq: Four times a day (QID) | RECTAL | Status: DC | PRN

## 2016-01-11 MED ORDER — VANCOMYCIN HCL IN DEXTROSE 1-5 GM/200ML-% IV SOLN
1000.0000 mg | Freq: Once | INTRAVENOUS | Status: AC
  Administered 2016-01-11: 1000 mg via INTRAVENOUS
  Filled 2016-01-11: qty 200

## 2016-01-11 MED ORDER — DEXTROSE 5 % IV SOLN
1.0000 g | Freq: Three times a day (TID) | INTRAVENOUS | Status: DC
  Filled 2016-01-11 (×2): qty 1

## 2016-01-11 MED ORDER — KCL IN DEXTROSE-NACL 20-5-0.45 MEQ/L-%-% IV SOLN
INTRAVENOUS | Status: DC
  Administered 2016-01-11: 100 mL via INTRAVENOUS
  Filled 2016-01-11 (×4): qty 1000

## 2016-01-11 MED ORDER — BISACODYL 10 MG RE SUPP: 10.0000 mg | RECTAL | Status: DC | PRN

## 2016-01-11 MED ORDER — ACETAMINOPHEN 325 MG PO TABS: 650.0000 mg | ORAL_TABLET | Freq: Four times a day (QID) | ORAL | Status: DC | PRN

## 2016-01-11 MED ORDER — METHYLPREDNISOLONE SODIUM SUCC 125 MG IJ SOLR
125.0000 mg | Freq: Once | INTRAMUSCULAR | Status: AC
  Administered 2016-01-11: 125 mg via INTRAVENOUS
  Filled 2016-01-11: qty 2

## 2016-01-11 MED ORDER — IPRATROPIUM-ALBUTEROL 0.5-2.5 (3) MG/3ML IN SOLN: 3.0000 mL | RESPIRATORY_TRACT | Status: DC | PRN

## 2016-01-11 MED ORDER — HYDRALAZINE HCL 20 MG/ML IJ SOLN
5.0000 mg | Freq: Four times a day (QID) | INTRAMUSCULAR | Status: DC | PRN
  Administered 2016-01-12: 5 mg via INTRAVENOUS
  Filled 2016-01-11: qty 1

## 2016-01-11 MED ORDER — RIVAROXABAN (XARELTO) EDUCATION KIT FOR DVT/PE PATIENTS
PACK | Freq: Once | Status: AC
  Administered 2016-01-11: 10:00:00
  Filled 2016-01-11: qty 1

## 2016-01-11 MED ORDER — DEXTROSE-NACL 5-0.45 % IV SOLN: INTRAVENOUS | Status: DC

## 2016-01-11 MED ORDER — RIVAROXABAN 15 MG PO TABS
15.0000 mg | ORAL_TABLET | Freq: Two times a day (BID) | ORAL | Status: DC
Start: 2016-01-11 — End: 2016-01-13
  Administered 2016-01-11 – 2016-01-13 (×5): 15 mg via ORAL
  Filled 2016-01-11 (×5): qty 1

## 2016-01-11 MED ORDER — IPRATROPIUM-ALBUTEROL 0.5-2.5 (3) MG/3ML IN SOLN
3.0000 mL | Freq: Once | RESPIRATORY_TRACT | Status: AC
  Administered 2016-01-11: 3 mL via RESPIRATORY_TRACT
  Filled 2016-01-11: qty 3

## 2016-01-11 MED ORDER — UREA 10 % EX LOTN
TOPICAL_LOTION | Freq: Every day | CUTANEOUS | Status: DC
  Administered 2016-01-11: 16:00:00 via TOPICAL
  Administered 2016-01-12: 1 via TOPICAL
  Administered 2016-01-13: 10:00:00 via TOPICAL
  Filled 2016-01-11: qty 177

## 2016-01-11 MED ORDER — SODIUM CHLORIDE 0.9 % IV BOLUS (SEPSIS)
1000.0000 mL | Freq: Once | INTRAVENOUS | Status: AC
  Administered 2016-01-11: 1000 mL via INTRAVENOUS

## 2016-01-11 MED ORDER — IPRATROPIUM-ALBUTEROL 0.5-2.5 (3) MG/3ML IN SOLN
3.0000 mL | Freq: Four times a day (QID) | RESPIRATORY_TRACT | Status: AC
  Administered 2016-01-11 (×2): 3 mL via RESPIRATORY_TRACT
  Filled 2016-01-11 (×4): qty 3

## 2016-01-11 MED ORDER — IOPAMIDOL (ISOVUE-370) INJECTION 76%
INTRAVENOUS | Status: AC
  Administered 2016-01-11: 100 mL
  Filled 2016-01-11: qty 100

## 2016-01-11 NOTE — ED Notes (Signed)
Patient transported to CT 

## 2016-01-11 NOTE — ED Provider Notes (Signed)
[facility]-EMERGENCY DEPT Provider Note   CSN: [id number] Arrival date & time: 01/11/16  0309  By signing my name below, I, Doreatha Martin, attest that this documentation has been prepared under the direction and in the presence of Dione Booze, MD. Electronically Signed: Doreatha Martin, ED Scribe. 01/11/16. 3:37 AM.     History   Chief Complaint Chief Complaint  Patient presents with  . Cough   LEVEL 5 CAVEAT: HPI and ROS limited due to dementia  HPI [name] is a 75 y.o. female brought in by ambulance who presents to the Emergency Department for evaluation of gradually worsening cough that began 4 days ago. Per son, pt has been intermittently wheezing and short of breath for a week. He states he has not heard a cough over the phone. He also states she has been more confused than normal, noting that she confuses her days/nights and says things she does not remember the next day. Son also reports pt is receiving 2.5 mg Oxycodone and states it appears to worsen her confusion. Son also notes that the trembling of her right hand is worsened from baseline today. Per EMS, CXR was done at [facility] on 01/07/16 that was negative and the pt was sent here for repeat CXR this evening d/t scheduling difficulties. Pt was sent for evaluation from her SNF [facility]. Pt is a non-smoker, but son notes that she was heavily exposed to second hand smoke for a period of her life.   The history is provided by the EMS personnel and a relative. History limited by: dementia. No language interpreter was used.    Past Medical History:  Diagnosis Date  . Bone spur of left foot   . Depression   . Diabetes mellitus   . Diabetes mellitus without complication (HCC) 11/29/2014  . Essential hypertension   . Fall   . 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 Gout   . Hemorrhoid   . Hypercalcemia 05/18/2015  . Hyperlipemia   . Hypertension   . Iron deficiency anemia secondary to blood loss (chronic)   . Obesity   .  Osteoarthritis   . Person living in residential institution 12/03/2014  . Solitary pulmonary nodule 01/07/2015  . UTI (lower urinary tract infection) 11/29/2014    Patient Active Problem List   Diagnosis Date Noted  . Dysuria 11/30/2015  . Anxiety state 09/30/2015  . Weight gain 08/24/2015  . Pruritic intertrigo 07/24/2015  . Hypercalcemia 05/18/2015  . Osteoarthritis 03/11/2015  . Anemia 03/11/2015  . Bone spur of left foot   . 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 Person living in residential institution 12/03/2014  . Diabetes mellitus without complication (HCC) 11/29/2014  . Hyperlipemia   . Essential hypertension   . Fall   . Solitary pulmonary nodule, possible 08/29/2014    Past Surgical History:  Procedure Laterality Date  . KNEE SURGERY Right     OB History    No data available       Home Medications    Prior to Admission medications   Medication Sig Start Date End Date Taking? Authorizing Provider  acetaminophen (TYLENOL) 650 MG CR tablet Take 650-1,300 mg by mouth 3 (three) times daily. Take 650 mg at 10 AM. Take 1300 mg at 6PM. Take 1300 mg at 2AM.    Historical Provider, MD  albuterol (PROVENTIL HFA;VENTOLIN HFA) 108 (90 BASE) MCG/ACT inhaler Inhale 2 puffs into the lungs every 6 (six) hours as needed for wheezing or shortness of breath.  02/20/15   Nani RavensAndrew M Wight, MD  ALPRAZolam Prudy Feeler(XANAX) 0.25 MG tablet Take 0.25 mg by mouth every 8 (eight) hours as needed for anxiety.    Historical Provider, MD  calcium citrate (CALCITRATE - DOSED IN MG ELEMENTAL CALCIUM) 950 MG tablet Take 200 mg of elemental calcium by mouth daily.    Historical Provider, MD  Cholecalciferol 2000 units CAPS Take 1 capsule (2,000 Units total) by mouth daily. 05/13/15   Jamal CollinJames R Joyner, MD  magnesium hydroxide (MILK OF MAGNESIA) 800 MG/5ML suspension Take by mouth daily as needed for constipation.    Historical Provider, MD  nitroGLYCERIN (NITROSTAT) 0.4 MG SL tablet Place 0.4 mg under the tongue every 5  (five) minutes as needed for chest pain.    Historical Provider, MD  oxyCODONE (ROXICODONE) 5 MG immediate release tablet Take 0.5 tablets (2.5 mg total) by mouth every 6 (six) hours as needed for severe pain. 12/23/15   Ardith Darkaleb M Parker, MD    Family History Family History  Problem Relation Age of Onset  . Heart attack Mother   . Diabetes Father     Social History Social History  Substance Use Topics  . Smoking status: Former Games developermoker  . Smokeless tobacco: Not on file  . Alcohol use Not on file     Allergies   Aspirin; Latex; and Penicillins   Review of Systems Review of Systems  Unable to perform ROS: Dementia     Physical Exam Updated Vital Signs BP (!) 158/130 (BP Location: Right Arm)   Pulse 111   Temp 98.5 F (36.9 C) (Rectal)   Resp 24   SpO2 94%   Physical Exam  Constitutional: She appears well-developed and well-nourished.  HENT:  Head: Normocephalic and atraumatic.  Eyes: Pupils are equal, round, and reactive to light.  Neck: Normal range of motion. Neck supple. No JVD present.  Cardiovascular: Normal rate, regular rhythm and normal heart sounds.   No murmur heard. Pulmonary/Chest: Effort normal. She has wheezes. She has no rales. She exhibits no tenderness.  Tachypnic with diffuse expiratory wheezes.   Abdominal: Soft. Bowel sounds are normal. She exhibits no distension and no mass. There is no tenderness.  Musculoskeletal: She exhibits no edema.  Flexion contracture of both legs.   Lymphadenopathy:    She has no cervical adenopathy.  Neurological:  Awake, and will follow some commands, but very limited speaking. Does not answer questions. Moderate tremor noted of right arm.   Skin: Skin is warm and dry. No rash noted.  Nursing note and vitals reviewed.    ED Treatments / Results   DIAGNOSTIC STUDIES: Oxygen Saturation is 94% on [place] 3L, adequate by my interpretation.    COORDINATION OF CARE: 3:27 AM Discussed treatment plan with son at bedside  which includes breathing tx, CXR and he agreed to plan.    Labs (all labs ordered are listed, but only abnormal results are displayed) Labs Reviewed  COMPREHENSIVE METABOLIC PANEL - Abnormal; Notable for the following:       Result Value   Chloride 96 (*)    Glucose, Bld 124 (*)    Total Protein 6.3 (*)    Albumin 2.4 (*)    All other components within normal limits  CBC WITH DIFFERENTIAL/PLATELET - Abnormal; Notable for the following:    WBC 16.7 (*)    Platelets 460 (*)    Neutro Abs 13.1 (*)    Monocytes Absolute 1.1 (*)    All other components within normal limits  URINALYSIS, ROUTINE W REFLEX MICROSCOPIC (NOT AT [facility]) - Abnormal; Notable for the following:    APPearance CLOUDY (*)    Protein, ur 100 (*)    All other components within normal limits  URINE MICROSCOPIC-ADD ON - Abnormal; Notable for the following:    Squamous Epithelial / LPF 0-5 (*)    Bacteria, UA MANY (*)    All other components within normal limits  URINE CULTURE  CULTURE, BLOOD (ROUTINE X 2)  CULTURE, BLOOD (ROUTINE X 2)  STREP PNEUMONIAE URINARY ANTIGEN  LEGIONELLA PNEUMOPHILA SEROGP 1 UR AG  TROPONIN I  D-DIMER, QUANTITATIVE (NOT AT [facility])    EKG  EKG Interpretation  Date/Time:  Monday January 11 2016 03:25:21 EDT Ventricular Rate:  107 PR Interval:    QRS Duration: 80 QT Interval:  309 QTC Calculation: 413 R Axis:   -18 Text Interpretation:  Sinus tachycardia Multiple premature complexes, vent & supraven Borderline left axis deviation Consider anterior infarct Artifact When compared with ECG of 08/25/2013, No significant change was found Confirmed by [facility]  MD, Adelena Desantiago ([id number]) on 01/11/2016 3:31:41 AM       Radiology Dg Chest Port 1 View  Result Date: 01/11/2016 CLINICAL DATA:  75 year old female with shortness of breath EXAM: PORTABLE CHEST 1 VIEW COMPARISON:  Chest radiograph dated 05/04/2014 FINDINGS: Shallow inspiration. Left lung base atelectatic changes versus less likely infiltrate.  The right lung is clear. There is no pleural effusion or pneumothorax. Stable cardiomegaly. Atherosclerotic calcification of the aortic arch. No acute osseous pathology. IMPRESSION: Left lung base atelectatic changes. Pneumonia is less likely. Clinical correlation is recommended. Electronically Signed   By: Elgie Collard M.D.   On: 01/11/2016 05:12    Procedures Procedures (including critical care time)  Medications Ordered in ED Medications  ipratropium-albuterol (DUONEB) 0.5-2.5 (3) MG/3ML nebulizer solution 3 mL (not administered)  vancomycin (VANCOCIN) IVPB 1000 mg/200 mL premix (not administered)  ceFEPIme (MAXIPIME) 2 g in dextrose 5 % 50 mL IVPB (not administered)  ipratropium-albuterol (DUONEB) 0.5-2.5 (3) MG/3ML nebulizer solution 3 mL (3 mLs Nebulization Given 01/11/16 0404)  methylPREDNISolone sodium succinate (SOLU-MEDROL) 125 mg/2 mL injection 125 mg (125 mg Intravenous Given 01/11/16 0403)     Initial Impression / Assessment and Plan / ED Course  I have reviewed the triage vital signs and the nursing notes.  Pertinent labs & imaging results that were available during my care of the patient were reviewed by me and considered in my medical decision making (see chart for details).  Clinical Course   Cough with wheezing suggestive of COPD or bronchitis or pneumonia. Old records reviewed, and she did have a nursing home visit for dyspnea approximately 10 months ago with question of whether she had COPD. Chest x-rays obtained and she is given methylprednisolone intravenously and is given a nebulizer treatment with albuterol and ipratropium.  Following above-noted treatment, she continues to be have tachypnea but seems to be more comfortable. On exam, there is improved air flow and actually increase in wheezing. Will repeat albuterol. Chest x-ray is read by radiologist as showing atelectasis, less likely pneumonia. I'm concerned of about possible lingular infiltrate and she is  started on antibiotics for healthcare associated pneumonia. Urine also shows evidence of infection. However, urinary tract infection should be adequately treated by the healthcare associated pneumonia regimen.  Phone call is received from Dr. Nadine Counts of family practice service who saw the patient has a nursing home. Apparently, she had been hypoxic there and is not on oxygen. She is  maintaining adequate oxygen saturation here is a long as she is on supplemental oxygen. Given the new oxygen demand and ongoing bronchospasm and presence of healthcare associated pneumonia, decision is made to admit the patient.  Final Clinical Impressions(s) / ED Diagnoses   Final diagnoses:  HCAP (healthcare-associated pneumonia)  Hypoxia    New Prescriptions New Prescriptions   No medications on file    I personally performed the services described in this documentation, which was scribed in my presence. The recorded information has been reviewed and is accurate.       Dione Booze, MD 01/11/16 [phone]

## 2016-01-11 NOTE — NC FL2 (Signed)
[place] LEVEL OF CARE SCREENING TOOL     IDENTIFICATION  Patient Name: [name] Birthdate: [birth year] Sex: female Admission Date (Current Location): 01/11/2016  [facility] [place] Number:  Herbalist and Address:  The . [facility] [place] [address] [place], [place] [address]      Provider Number: [id number]  Attending Physician Name and Address:  Zenia Resides, MD  Relative Name and Phone Number:       Current Level of Care: Hospital Recommended Level of Care: [place] Prior Approval Number:    Date Approved/Denied:   PASRR Number:    Discharge Plan: SNF    Current Diagnoses: Patient Active Problem List   Diagnosis Date Noted  . HCAP (healthcare-associated pneumonia) 01/11/2016  . Hypoxia 01/11/2016  . Accelerated hypertension 01/11/2016  . Pulmonary embolism ([place]) 01/11/2016  . Pressure injury of skin 01/11/2016  . Prediabetes 01/11/2016  . Thoracic aortic aneurysm ([place]) 01/11/2016  . 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 Pulmonary nodules/lesions, multiple   . Dysuria 11/30/2015  . Anxiety state 09/30/2015  . Weight gain 08/24/2015  . Pruritic intertrigo 07/24/2015  . Hypercalcemia 05/18/2015  . Osteoarthritis 03/11/2015  . Anemia 03/11/2015  . Bone spur of left foot   . 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 Person living in residential institution 12/03/2014  . Diabetes mellitus without complication ([place]) 38/25/0539  . Acute delirium 11/29/2014  . Hyperlipemia   . Essential hypertension   . Fall   . Solitary pulmonary nodule, possible 08/29/2014    Orientation RESPIRATION BLADDER Height & Weight        O2 (3L [place]) Incontinent Weight:   Height:     BEHAVIORAL SYMPTOMS/MOOD NEUROLOGICAL BOWEL NUTRITION STATUS      Incontinent Diet  AMBULATORY STATUS COMMUNICATION OF NEEDS Skin   Extensive Assist Verbally PU Stage and Appropriate Care (DTI on heel)     PU Stage 3 Dressing:   (sacrum)                 Personal Care Assistance Level of Assistance  Bathing, Dressing Bathing Assistance: Maximum assistance   Dressing Assistance: Maximum assistance     Functional Limitations Info             SPECIAL CARE FACTORS FREQUENCY                       Contractures      Additional Factors Info  Code Status, Allergies Code Status Info: FULL Allergies Info: Aspirin, Latex, Penicillins           Current Medications (01/11/2016):  This is the current hospital active medication list Current Facility-Administered Medications  Medication Dose Route Frequency Provider Last Rate Last Dose  . acetaminophen (TYLENOL) tablet 650 mg  650 mg Oral Q6H PRN Janora Norlander, DO       Or  . acetaminophen (TYLENOL) suppository 650 mg  650 mg Rectal Q6H PRN Janora Norlander, DO      . bisacodyl (DULCOLAX) suppository 10 mg  10 mg Rectal PRN Ashly M Gottschalk, DO      . dextrose 5 % and 0.45 % NaCl with KCl 20 mEq/L infusion   Intravenous Continuous Todd D McDiarmid, MD      . hydrALAZINE (APRESOLINE) injection 5 mg  5 mg Intravenous Q6H PRN Ashly M Gottschalk, DO      . ipratropium-albuterol (DUONEB) 0.5-2.5 (3) MG/3ML nebulizer solution 3 mL  3 mL Nebulization Q4H PRN Ashly M Gottschalk, DO      . ipratropium-albuterol (DUONEB) 0.5-2.5 (3) MG/3ML nebulizer solution 3 mL  3 mL Nebulization Q6H Blane Ohara McDiarmid, MD   3 mL at 01/11/16 1421  . levofloxacin (LEVAQUIN) IVPB 750 mg  750 mg Intravenous Q24H Blane Ohara McDiarmid, MD      . rivaroxaban Alveda Reasons) Education Kit for DVT/PE patients   Does not apply Once Ashly M Gottschalk, DO      . Rivaroxaban (XARELTO) tablet 15 mg  15 mg Oral BID WC Ashly M Gottschalk, DO   15 mg at 01/11/16 1159  . urea 10 % lotion   Topical Daily Blane Ohara McDiarmid, MD         Discharge Medications: Please see discharge summary for a list of discharge medications.  Relevant Imaging Results:  Relevant Lab Results:   Additional  Information SS#: [id number]  Jorge Ny, LCSW

## 2016-01-11 NOTE — ED Notes (Signed)
Report given. Pt in CT, will be transported once she returns to room in ED.

## 2016-01-11 NOTE — Telephone Encounter (Signed)
**  After Hours/ Emergency Line Call*  Received a call to report that [name] continues to be tachypneic.  CXR has not arrived yet.  Continues to have O2 requirement.  Recommended that patient be transferred to ED for further evaluation.  Cyrus Ramsburg M. Nadine CountsGottschalk, DO PGY-3, [facility]

## 2016-01-11 NOTE — ED Notes (Signed)
Portable chest x-ray at bedside at this time. 

## 2016-01-11 NOTE — ED Notes (Signed)
Dr. Glick at bedside at this time  

## 2016-01-11 NOTE — Consult Note (Signed)
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 Pressure Ulcer POA: Yes Measurement:4x3x0.2cm Wound bed: Red granulation 95%, yellow slough 5% Drainage (amount, consistency, odor) Small seriosangenous Periwound:Intact Dressing procedure/placement/frequency:Applied Foam border, change every 3 days, prn soilage  WOC Nurse wound consult note Reason for Consult: Rt heel ulcer Wound type:DTI  Rt heel Pressure Ulcer POA: Yes Measurement:3x4 Wound bed: Dry purple Drainage (amount, consistency, odor) none Periwound:Intact Dressing procedure/placement/frequency: Applied foam border to wound bed, change every 5 days or prn soilage, Keep heels elevated.   WOC Nurse wound consult note Reason for Consult: Red under breast/ Intertriginous skin damage Wound type: Intertriginous skin damage Lt breast Pressure Ulcer POA: Yes/ Measurement:n/a Wound bed:red Drainage (amount, consistency, odor) none Periwound:intact Dressing procedure/placement/frequency: Antifungal powder to lt breast fold, daily.  Please re-consult if further assistance is needed.  Thank-you,  Cammie Mcgeeawn Frimy Uffelman MSN, RN, CWOCN, [place]CN-AP, CNS [phone]

## 2016-01-11 NOTE — ED Notes (Signed)
Phlebotomy at bedside at this time.

## 2016-01-11 NOTE — ED Triage Notes (Signed)
Patient arrived to ED via [facility]. EMS reports: Patient resident at [facility]. Patient had cough on 01/07/16 at which time she had a xray. Staff reports patient had temp 99.5 earlier this evening. VSS. BP 140/90, Pulse 99 irregular, 94% on 3 LPM via nasal cannula.  EMS administered Albuterol 5% neb. Pulse ox 97% after neb treatment.  Patient is full code.

## 2016-01-11 NOTE — ED Notes (Signed)
Hospitalist at bedside at this time 

## 2016-01-11 NOTE — Progress Notes (Signed)
FPTS Interim Progress Note  Saw patient at 3 PM today.  Somnolent on exam but arousable by sternal rub.  However fell asleep while examining.  Appears to have a decreased level of consciousness as compared to her baseline.  Patient tachycardic to 100s, not tachypneic.  Satting well on 3L by [place] at 95%.    CTA with RUL pulmonary artery emboli.  Likely precipitant is prolonged immobility.  Discussed with Dr. McDiarmid this AM and started on Rivaroxiban with initial therapy for 3 months.   Will consider long term therapy given her immobility.   Freddrick MarchYashika Betrice Wanat, MD 01/11/2016, 3:22 PM PGY-1, [facility] pager [phone]

## 2016-01-11 NOTE — H&P (Signed)
Family Medicine Teaching [facility] Admission History and Physical Service Pager: [phone]  Patient name: [name] Medical record number: [id number] Date of birth: 1940/10/24 Age: 75 y.o. Gender: female  Primary Care Provider: Rodrigo Ran, MD Consultants: none Code Status: FULL (discussed on admission w/ son and [place], [place] RN)  Chief Complaint: dyspnea, cough  Assessment and Plan: CAMBER NINH is a 75 y.o. female presenting with hypoxia . PMH is significant for HTN, HLD, DM2, Anemia, Anxiety, resides in nursing home  # Hypoxia: suspect 2/2 HCAP.  WBC 16.7. CXR with LLL infiltrate vs atelectasis.  Patient saturating 94% on 3L [place] (no home O2 requirement).  RR 36 in ED.  BP 172/118. HR 98. Afebrile.  S/p 2x duoneb, Solu-medrol 125mg , Vanc/ Cefepime in ED.  CURB-65 score: 3-4, warranting inpatient hospital admission. DDx: PE (Wells Score 3) vs MI (EKG unchanged from previous and no CP) vs viral URI vs undiagnosed COPD w/ exacerbation - Admit to med surg under Dr Leveda Anna - VS per floor protocol - Continue Vanc/ Cefepime (10/23> ) - Obtain blood cultures - Urine Strep pna, Urine Legionella - Obtain troponin, DDimer - Consider repeat CXR 2-v - Supplemental O2 prn saturation <92%, wean as tolerated - Duoneb q4, Albuterol q2 prn  # Accelerated HTN: not on antihypertensives outpatient. 11/2015 BP was well controlled off meds for age. - Hydralazine 5mg  q8 prn SBP>180, DBP>110 - Monitor  # DM2: diet controlled. Last A1c 09/2015: 5.7 - Monitor CBGs in setting of acute illness/ s/p steroids  # Anemia: hgb at baseline 12.3  # Anxiety: has Xanax prn but often does not require - Consider Ativan prn here but limit benzos as able in the setting of respiratory illness  # Heel wound/ sacral wound: - c/s wound care  # Nursing home resident: Verdis Prime - c/s CSW to assist with return to [facility] at discharge  FEN/GI: HH/ Carb mod diet Prophylaxis: Lovenox sub-q  Disposition:  Admit to med surg for IV abx for presumed HCAP  History of Present Illness:  [name] is a 75 y.o. female presenting with hypoxia, tachypnea and cough  Most of the history is provided by Rene Kocher, Charity fundraiser at [place].  Patient has had a cough since 10/17.  She was evaluated by the SNF provider and had a CXR done which was unremarkable.  She was treated with supportive care and Robitussin for cough.  She remained afebrile in nursing home.  However, she became hypoxic to 86% on room air around 8 pm last evening.  She was placed on 2 L [place] and was saturating around 94%.  At this time, I evaluated patient and she was tachycardic, tachypneic, using accessory muscles and increased wheeze was appreciated in the Left lung base.  Duoneb treatments were ordered.  Several studies were ordered including a stat CXR, but this unfortunately could not be accommodated in a reasonable amount of time. Patient was therefore transferred to the [facility] ED.  Review Of Systems: Per HPI with the following additions: none  ROS  Patient Active Problem List   Diagnosis Date Noted  . Dysuria 11/30/2015  . Anxiety state 09/30/2015  . Weight gain 08/24/2015  . Pruritic intertrigo 07/24/2015  . Hypercalcemia 05/18/2015  . Osteoarthritis 03/11/2015  . Anemia 03/11/2015  . Bone spur of left foot   . 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 Person living in residential institution 12/03/2014  . Diabetes mellitus without complication (HCC) 11/29/2014  . Hyperlipemia   . Essential hypertension   .  Fall   . Solitary pulmonary nodule, possible 08/29/2014    Past Medical History: Past Medical History:  Diagnosis Date  . Bone spur of left foot   . Depression   . Diabetes mellitus   . Diabetes mellitus without complication (HCC) 11/29/2014  . Essential hypertension   . Fall   . 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 Gout   . Hemorrhoid   . Hypercalcemia 05/18/2015  . Hyperlipemia   . Hypertension   . Iron deficiency anemia secondary to blood  loss (chronic)   . Obesity   . Osteoarthritis   . Person living in residential institution 12/03/2014  . Solitary pulmonary nodule 01/07/2015  . UTI (lower urinary tract infection) 11/29/2014    Past Surgical History: Past Surgical History:  Procedure Laterality Date  . KNEE SURGERY Right     Social History: Social History  Substance Use Topics  . Smoking status: Former Games developermoker  . Smokeless tobacco: Not on file  . Alcohol use Not on file   Additional social history: none  Please also refer to relevant sections of EMR.  Family History: Family History  Problem Relation Age of Onset  . Heart attack Mother   . Diabetes Father     Allergies and Medications: Allergies  Allergen Reactions  . Aspirin Other (See Comments)    Bleeding   . Latex Itching  . Penicillins Rash    Has patient had a PCN reaction causing immediate rash, facial/tongue/throat swelling, SOB or lightheadedness with hypotension: No Has patient had a PCN reaction causing severe rash involving mucus membranes or skin necrosis: No Has patient had a PCN reaction that required hospitalization No Has patient had a PCN reaction occurring within the last 10 years: No If all of the above answers are "NO", then may proceed with Cephalosporin use.    No current facility-administered medications on file prior to encounter.    Current Outpatient Prescriptions on File Prior to Encounter  Medication Sig Dispense Refill  . acetaminophen (TYLENOL) 650 MG CR tablet Take 650-1,300 mg by mouth See admin instructions. Take 650 mg at 10 AM. Take 1300 mg at [facility]6PM. Take 1300 mg at 2AM.    . albuterol (PROVENTIL HFA;VENTOLIN HFA) 108 (90 BASE) MCG/ACT inhaler Inhale 2 puffs into the lungs every 6 (six) hours as needed for wheezing or shortness of breath. 1 Inhaler 0  . ALPRAZolam (XANAX) 0.25 MG tablet Take 0.25 mg by mouth every 8 (eight) hours as needed for anxiety.    . calcium citrate (CALCITRATE - DOSED IN MG ELEMENTAL CALCIUM)  950 MG tablet Take 200 mg of elemental calcium by mouth daily.    . Cholecalciferol 2000 units CAPS Take 1 capsule (2,000 Units total) by mouth daily. 30 each 0  . nitroGLYCERIN (NITROSTAT) 0.4 MG SL tablet Place 0.4 mg under the tongue every 5 (five) minutes as needed for chest pain.    Marland Kitchen. oxyCODONE (ROXICODONE) 5 MG immediate release tablet Take 0.5 tablets (2.5 mg total) by mouth every 6 (six) hours as needed for severe pain. 30 tablet 0    Objective: BP (!) 162/115 (BP Location: Right Arm)   Pulse 100   Temp 98.5 F (36.9 C) (Rectal)   Resp (!) 36   SpO2 94%  Exam: General: awake, alert, elderly female, NAD, son at bedside Eyes: sclera white, some discharge appreciated in the left eye ENTM: tacky MM, poor dentition Neck: supple Cardiovascular: RRR, no murmurs Respiratory: Expiratory wheeze on left, dyspneic with speech, 3L [place]  in place Gastrointestinal: soft, NT/ND, +BS MSK: LE with contractures, no edema; warm Derm: left heel bandaged, sacral lesion also present Neuro: follows commands but needs frequent redirection, AOx3 Psych: mood stable, affect flat  Labs and Imaging: CBC BMET   Recent Labs Lab 01/11/16 0334  WBC 16.7*  HGB 12.3  HCT 38.7  PLT 460*    Recent Labs Lab 01/11/16 0334  NA 139  K 4.1  CL 96*  CO2 32  BUN 20  CREATININE 0.53  GLUCOSE 124*  CALCIUM 9.8     Dg Chest Port 1 View  Result Date: 01/11/2016 CLINICAL DATA:  75 year old female with shortness of breath EXAM: PORTABLE CHEST 1 VIEW COMPARISON:  Chest radiograph dated 05/04/2014 FINDINGS: Shallow inspiration. Left lung base atelectatic changes versus less likely infiltrate. The right lung is clear. There is no pleural effusion or pneumothorax. Stable cardiomegaly. Atherosclerotic calcification of the aortic arch. No acute osseous pathology. IMPRESSION: Left lung base atelectatic changes. Pneumonia is less likely. Clinical correlation is recommended. Electronically Signed   By: Elgie Collard M.D.   On: 01/11/2016 05:12    Raliegh Ip, DO 01/11/2016, 5:35 AM PGY-3, [facility] FPTS Intern pager: [phone], text pages welcome

## 2016-01-12 ENCOUNTER — Encounter (HOSPITAL_COMMUNITY): Payer: Self-pay | Admitting: Family Medicine

## 2016-01-12 DIAGNOSIS — I471 Supraventricular tachycardia: Secondary | ICD-10-CM

## 2016-01-12 DIAGNOSIS — L8995 Pressure ulcer of unspecified site, unstageable: Secondary | ICD-10-CM

## 2016-01-12 DIAGNOSIS — I4719 Other supraventricular tachycardia: Secondary | ICD-10-CM

## 2016-01-12 HISTORY — DX: Supraventricular tachycardia: I47.1

## 2016-01-12 HISTORY — DX: Other supraventricular tachycardia: I47.19

## 2016-01-12 LAB — BLOOD CULTURE ID PANEL (REFLEXED)
ACINETOBACTER BAUMANNII: NOT DETECTED
CANDIDA ALBICANS: NOT DETECTED
CANDIDA KRUSEI: NOT DETECTED
CANDIDA PARAPSILOSIS: NOT DETECTED
Candida glabrata: NOT DETECTED
Candida tropicalis: NOT DETECTED
ENTEROBACTERIACEAE SPECIES: NOT DETECTED
ESCHERICHIA COLI: NOT DETECTED
Enterobacter cloacae complex: NOT DETECTED
Enterococcus species: NOT DETECTED
Haemophilus influenzae: NOT DETECTED
KLEBSIELLA OXYTOCA: NOT DETECTED
KLEBSIELLA PNEUMONIAE: NOT DETECTED
Listeria monocytogenes: NOT DETECTED
Methicillin resistance: DETECTED — AB
Neisseria meningitidis: NOT DETECTED
PSEUDOMONAS AERUGINOSA: NOT DETECTED
Proteus species: NOT DETECTED
SERRATIA MARCESCENS: NOT DETECTED
STREPTOCOCCUS PNEUMONIAE: NOT DETECTED
STREPTOCOCCUS PYOGENES: NOT DETECTED
Staphylococcus aureus (BCID): NOT DETECTED
Staphylococcus species: DETECTED — AB
Streptococcus agalactiae: NOT DETECTED
Streptococcus species: NOT DETECTED

## 2016-01-12 LAB — CBC
HEMATOCRIT: 35.9 % — AB (ref 36.0–46.0)
HEMOGLOBIN: 11.4 g/dL — AB (ref 12.0–15.0)
MCH: 30.7 pg (ref 26.0–34.0)
MCHC: 31.8 g/dL (ref 30.0–36.0)
MCV: 96.8 fL (ref 78.0–100.0)
Platelets: 383 10*3/uL (ref 150–400)
RBC: 3.71 MIL/uL — ABNORMAL LOW (ref 3.87–5.11)
RDW: 14.9 % (ref 11.5–15.5)
WBC: 14.7 10*3/uL — ABNORMAL HIGH (ref 4.0–10.5)

## 2016-01-12 LAB — BASIC METABOLIC PANEL
Anion gap: 13 (ref 5–15)
BUN: 17 mg/dL (ref 6–20)
CALCIUM: 9.3 mg/dL (ref 8.9–10.3)
CHLORIDE: 96 mmol/L — AB (ref 101–111)
CO2: 25 mmol/L (ref 22–32)
CREATININE: 0.47 mg/dL (ref 0.44–1.00)
GFR calc Af Amer: >60 mL/min (ref >60)
GFR calc non Af Amer: >60 mL/min (ref >60)
GLUCOSE: 144 mg/dL — AB (ref 65–99)
Potassium: 4.1 mmol/L (ref 3.5–5.1)
Sodium: 134 mmol/L — ABNORMAL LOW (ref 135–145)

## 2016-01-12 LAB — LEGIONELLA PNEUMOPHILA SEROGP 1 UR AG: L. PNEUMOPHILA SEROGP 1 UR AG: NEGATIVE

## 2016-01-12 LAB — PREALBUMIN: Prealbumin: 15.9 mg/dL — ABNORMAL LOW (ref 18–38)

## 2016-01-12 MED ORDER — LEVOFLOXACIN 750 MG PO TABS
750.0000 mg | ORAL_TABLET | Freq: Every day | ORAL | Status: DC
  Administered 2016-01-13: 750 mg via ORAL
  Filled 2016-01-12: qty 1

## 2016-01-12 MED ORDER — LEVOFLOXACIN IN D5W 750 MG/150ML IV SOLN
750.0000 mg | INTRAVENOUS | Status: DC
  Administered 2016-01-12: 750 mg via INTRAVENOUS
  Filled 2016-01-12: qty 150

## 2016-01-12 MED ORDER — VANCOMYCIN HCL IN DEXTROSE 750-5 MG/150ML-% IV SOLN
750.0000 mg | Freq: Two times a day (BID) | INTRAVENOUS | Status: DC
  Filled 2016-01-12: qty 150

## 2016-01-12 MED ORDER — LEVOFLOXACIN 750 MG PO TABS: 750.0000 mg | ORAL_TABLET | Freq: Every day | ORAL | Status: DC

## 2016-01-12 MED ORDER — VANCOMYCIN HCL IN DEXTROSE 750-5 MG/150ML-% IV SOLN
750.0000 mg | Freq: Once | INTRAVENOUS | Status: AC
  Administered 2016-01-12: 750 mg via INTRAVENOUS
  Filled 2016-01-12 (×2): qty 150

## 2016-01-12 MED ORDER — CARVEDILOL 3.125 MG PO TABS
3.1250 mg | ORAL_TABLET | Freq: Two times a day (BID) | ORAL | Status: DC
  Administered 2016-01-12 – 2016-01-13 (×3): 3.125 mg via ORAL
  Filled 2016-01-12 (×3): qty 1

## 2016-01-12 MED ORDER — CARVEDILOL 3.125 MG PO TABS: 3.1250 mg | ORAL_TABLET | Freq: Two times a day (BID) | ORAL | Status: DC

## 2016-01-12 NOTE — Care Management Note (Addendum)
Case Management Note  Patient Details  Name: Gita KudoMina S Aldea MRN: [id number] Date of Birth: 08-Oct-1940  Subjective/Objective:      Presenting with hypoxia. PMH is significant for HTN, HLD, DM2, Anemia, Anxiety[facility] Found to have right sided PE per CTA.            Fabian NovemberBobby Sexton (Son)     [phone]       PCP: Rodrigo Ranrystal Dorsey  Action/Plan: Plan is to return to SNF when medically stable.CM to d/c needs.  Expected Discharge Date:                  Expected Discharge Plan:  Skilled Nursing Facility [facility])  In-House Referral:  Clinical Social Work  Discharge planning Services  CM Consult  Post Acute Care Choice:    Choice offered to:     DME Arranged:    DME Agency:     HH Arranged:    HH Agency:     Status of Service:  In process, will continue to follow  If discussed at Long Length of Stay Meetings, dates discussed:    Additional Comments:  Epifanio LeschesCole, Moises Terpstra Hudson, RN 01/12/2016, 12:45 PM

## 2016-01-12 NOTE — Progress Notes (Signed)
Pharmacy Antibiotic Note  Gita KudoMina S Izard is a 75 y.o. female admitted on 01/11/2016 with PE, now w/ bacteremiawith BCID showing staph spp w/ methicillin resistance.  Pharmacy has been consulted for vancomycin dosing.  Plan: Rec'd vanc 1g in ED yesterday am. Vancomycin 750mg  IV every 12 hours.  Goal trough 15-20 mcg/mL.   Temp (24hrs), Avg:98.4 F (36.9 C), Min:98.2 F (36.8 C), Max:98.5 F (36.9 C)   Recent Labs Lab 01/11/16 0334 01/11/16 0737  WBC 16.7*  --   CREATININE 0.53  --   LATICACIDVEN  --  1.2     Allergies  Allergen Reactions  . Aspirin Other (See Comments)    Bleeding   . Latex Itching  . Penicillins Rash    Has patient had a PCN reaction causing immediate rash, facial/tongue/throat swelling, SOB or lightheadedness with hypotension: No Has patient had a PCN reaction causing severe rash involving mucus membranes or skin necrosis: No Has patient had a PCN reaction that required hospitalization No Has patient had a PCN reaction occurring within the last 10 years: No If all of the above answers are "NO", then may proceed with Cephalosporin use.      Thank you for allowing pharmacy to be a part of this patient's care.  Vernard GamblesVeronda Valree Feild, PharmD, BCPS  01/12/2016 2:45 AM

## 2016-01-12 NOTE — Progress Notes (Signed)
Family Medicine Teaching Service Daily Progress Note Intern Pager: [phone]  Patient name: Lynn KudoMina S [name] Medical record number: [id number] Date of birth: 1940-07-09 Age: 75 y.o. Gender: female  Primary Care Provider: Rodrigo Ranrystal Dorsey, MD Consultants: none Code Status: FULL  Pt Overview and Major Events to Date:  Admit 10/23  Assessment and Plan: Lynn KudoMina S Lombard is a 75 y.o. female presenting with hypoxia . PMH is significant for HTN, HLD, DM2, Anemia, Anxiety, resides in nursing home  # Hypoxia 2/2 Pulmonary embolism.  CTA showing right sided PE. Possible precipitant is immobility.  First time VTE - VS per floor protocol -Started on Xarelto 10/23 - initial therapy for 3 months with consideration of long term therapy given that immobility may not improve significantly - Discontinue Vancomycin 10/24 -Continue Levaquin 750 mg Q12 - Bcx --> + for Methicillin resistant staph spp, but not staph aureus - Urine Strep pna negative, Urine Legionella pending - Supplemental O2 prn saturation <92%, wean as tolerated -tele to look for atrial fibrillation - SABA nebs, Albuterol q2 prn  #CT findings with recommended f/u  4.2 cm ascending thoracic aortic aneurysm.  Recommend annual imaging f/u by CTA or MRA. Coronary artery calcifications noted suggesting CAD. 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 Multiple pulm nodules noted b/l with largest measuring 8 mm in right middle lobe which may represent mets. Recc non-contrast chest CT at 3-6 months. If nodules are stable at time of repeat CT, then future CT at 18-24 months (from today's scan).  -Carvedilol 3.125 mg bid to slow progression of ascending aortic aneurysm  # Accelerated HTN:  Previously on Cozaar 50 mg but discontinued recently due to neck pain and headache.   11/2015 BP was well controlled off meds for age. BP this AM 186/98 - Hydralazine 5mg  q8 prn SBP>180, DBP>110 -Start Carvedilol 3.125 mg bid - Monitor  closely  #Leukocytosis. On admission WBC 16.7.   -WBC this AM 14.7  # DM2: diet controlled. Last A1c 09/2015: 5.7 - Monitor CBGs in setting of acute illness/ s/p steroids  # Anemia: hgb at baseline 12.3  # Anxiety: has Xanax prn but often does not require - Consider Ativan prn here but limit benzos as able in the setting of respiratory illness  # Heel wound/ sacral wound: - c/s wound care  # [facility]  FEN/GI: HH/ Carb mod diet Prophylaxis: Lovenox sub-q  Disposition:  Continue to monitor on inpatient floor.   Subjective:  Patient laying in bed, talkative but appears confused. Oriented to place and person but not time. No complaints at this time but unable to speak sensibly.   Objective: Temp:  [97.9 F (36.6 C)-98.4 F (36.9 C)] 97.9 F (36.6 C) (10/24 14780614) Pulse Rate:  [90-111] 96 (10/24 0614) Resp:  [18-21] 18 (10/24 0614) BP: (150-186)/(68-101) 186/98 (10/24 0614) SpO2:  [94 %-98 %] 98 % (10/24 0614) Weight:  [179 lb 14.3 oz (81.6 kg)] 179 lb 14.3 oz (81.6 kg) (10/24 0200)   Physical Exam: General: awake, elderly female Eyes: sclera white, EOMI ENTM: MMM, poor dentition Neck: supple, no LAD Cardiovascular: RRR, no MRG Respiratory: expiratory wheeze on left, no dyspnea with speech, speaks comfortably in full sentences, 3L  in place Gastrointestinal: soft, NT/ND, +BS MSK: LE with contractures, no edema; warm Derm: left heel bandaged, sacral lesion also present Neuro: follows commands but needs frequent redirection, AOx3 Psych: mood stable, flat affect  Laboratory:  Recent  Labs Lab 01/11/16 0334 01/12/16 0627  WBC 16.7* 14.7*  HGB 12.3 11.4*  HCT 38.7 35.9*  PLT 460* 383    Recent Labs Lab 01/11/16 0334 01/12/16 0627  NA 139 134*  K 4.1 4.1  CL 96* 96*  CO2 32 25  BUN 20 17  CREATININE 0.53 0.47  CALCIUM 9.8 9.3  PROT 6.3*  --   BILITOT 0.7  --    ALKPHOS 85  --   ALT 14  --   AST 17  --   GLUCOSE 124* 144*   Imaging/Diagnostic Tests: Ct Angio Chest Pe W Or Wo Contrast  Result Date: 01/11/2016 CLINICAL DATA:  Wheezing. EXAM: CT ANGIOGRAPHY CHEST WITH CONTRAST TECHNIQUE: Multidetector CT imaging of the chest was performed using the standard protocol during bolus administration of intravenous contrast. Multiplanar CT image reconstructions and MIPs were obtained to evaluate the vascular anatomy. CONTRAST:  100 mL of Isovue 370 intravenously. COMPARISON:  Radiograph of same day. FINDINGS: Cardiovascular: Atherosclerosis of thoracic aorta is noted. 4.2 cm ascending thoracic aortic aneurysm is noted. No definite dissection is noted. Filling defects are noted in the upper lobe branches of the right pulmonary artery consistent with acute pulmonary emboli. 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 Coronary artery calcifications are noted. RV/LV ratio of 0.7 is noted. Mediastinum/Nodes: No mediastinal mass or adenopathy is noted. Lungs/Pleura: No pneumothorax or pleural effusion is noted. 6 mm nodule is noted inferiorly in the lingular segment of left upper lobe best seen on image number 71 of series 5. Multiple other nodules are noted bilaterally. 8 mm nodule is noted inferiorly in right middle lobe best seen on image number 79 of series 5. Calcified granuloma is noted laterally in right upper lobe. Upper Abdomen: No definite abnormality seen in the visualized upper portion of the abdomen. 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 Review of the MIP images confirms the above findings. IMPRESSION: Aortic atherosclerosis. 4.2 cm ascending thoracic aortic aneurysm. Recommend annual imaging followup by CTA or MRA. This recommendation follows 2010 ACCF/AHA/AATS/ACR/ASA/SCA/SCAI/SIR/STS/SVM Guidelines for the Diagnosis and Management of Patients with Thoracic Aortic Disease.  Circulation. 2010; 121: Z610-R604. Coronary artery calcifications are noted suggesting coronary artery disease. 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 Multiple pulmonary nodules are noted bilaterally with the largest measuring 8 mm in right middle lobe which may represent metastatic disease. Non-contrast chest CT at 3-6 months is recommended. If the nodules are stable at time of repeat CT, then future CT at 18-24 months (from today's scan) is considered optional for low-risk patients, but is recommended for high-risk patients. This recommendation follows the consensus statement: Guidelines for Management of Incidental Pulmonary Nodules Detected on CT Images: From the Fleischner Society 2017; Radiology 2017; 284:228-243. Acute pulmonary emboli are noted in upper lobe branches of right pulmonary artery. Critical Value/emergent results were called by telephone at the time of interpretation on 01/11/2016 at 9:09 am to Dr. Delynn Flavin, who verbally acknowledged these results. Electronically Signed   By: Lupita Raider, M.D.   On: 01/11/2016 09:10   Dg Chest Port 1 View  Result Date: 01/11/2016 CLINICAL DATA:  75 year old female with shortness of breath EXAM: PORTABLE CHEST 1 VIEW COMPARISON:  Chest radiograph dated 05/04/2014 FINDINGS: Shallow inspiration. Left lung base atelectatic changes versus less likely infiltrate. The right lung is clear. There is no pleural effusion or pneumothorax. Stable cardiomegaly. Atherosclerotic calcification of the aortic arch. No acute osseous pathology.  IMPRESSION: Left lung base atelectatic changes. Pneumonia is less likely. Clinical correlation is recommended. Electronically Signed   By: Elgie Collard M.D.   On: 01/11/2016 05:12   Freddrick March, MD 01/12/2016, 9:22 AM PGY-1, [facility] FPTS Intern pager: [phone], text pages welcome

## 2016-01-12 NOTE — Progress Notes (Signed)
PHARMACY - PHYSICIAN COMMUNICATION CRITICAL VALUE ALERT - BLOOD CULTURE IDENTIFICATION (BCID)  Results for orders placed or performed during the hospital encounter of 01/11/16  Blood Culture ID Panel (Reflexed) (Collected: 01/11/2016  5:56 AM)  Result Value Ref Range   Enterococcus species NOT DETECTED NOT DETECTED   Listeria monocytogenes NOT DETECTED NOT DETECTED   Staphylococcus species DETECTED (A) NOT DETECTED   Staphylococcus aureus NOT DETECTED NOT DETECTED   Methicillin resistance DETECTED (A) NOT DETECTED   Streptococcus species NOT DETECTED NOT DETECTED   Streptococcus agalactiae NOT DETECTED NOT DETECTED   Streptococcus pneumoniae NOT DETECTED NOT DETECTED   Streptococcus pyogenes NOT DETECTED NOT DETECTED   Acinetobacter baumannii NOT DETECTED NOT DETECTED   Enterobacteriaceae species NOT DETECTED NOT DETECTED   Enterobacter cloacae complex NOT DETECTED NOT DETECTED   Escherichia coli NOT DETECTED NOT DETECTED   Klebsiella oxytoca NOT DETECTED NOT DETECTED   Klebsiella pneumoniae NOT DETECTED NOT DETECTED   Proteus species NOT DETECTED NOT DETECTED   Serratia marcescens NOT DETECTED NOT DETECTED   Haemophilus influenzae NOT DETECTED NOT DETECTED   Neisseria meningitidis NOT DETECTED NOT DETECTED   Pseudomonas aeruginosa NOT DETECTED NOT DETECTED   Candida albicans NOT DETECTED NOT DETECTED   Candida glabrata NOT DETECTED NOT DETECTED   Candida krusei NOT DETECTED NOT DETECTED   Candida parapsilosis NOT DETECTED NOT DETECTED   Candida tropicalis NOT DETECTED NOT DETECTED    Name of physician (or Provider) Contacted: Dr Sydnee Cabaliallo  Changes to prescribed antibiotics required: Add vanc  Vernard GamblesVeronda Kasem Mozer, PharmD, BCPS  01/12/2016  2:41 AM

## 2016-01-12 NOTE — Clinical Social Work Note (Signed)
Clinical Social Work Assessment  Patient Details  Name: Lynn KudoMina S [name] MRN: [id number] Date of Birth: May 24, 1940  Date of referral:  01/12/16               Reason for consult:  Facility Placement                Permission sought to share information with:  Family Supports, Oceanographeracility Contact Representative Permission granted to share information::  No (pt disoriented x3)  Name::     [name]  Agency::  [facility]  Relationship::  son  Contact Information:     Housing/Transportation Living arrangements for the past 2 months:  [facility] Facility Source of Information:  Adult Children Patient Interpreter Needed:  None Criminal Activity/Legal Involvement Pertinent to Current Situation/Hospitalization:    Significant Relationships:  Adult Children Lives with:  Facility Resident Do you feel safe going back to the place where you live?  Yes Need for family participation in patient care:  Yes (Comment) (decision making)  Care giving concerns:  None- [facility]- [facility]   Social Worker assessment / plan:  CSW spoke with pt son about plan for pt return to SNF when stable.  Employment status:  Retired Database administratornsurance information:  Managed Medicare PT Recommendations:  Not assessed at this time Information / Referral to community resources:     Patient/Family's Response to care:  Son agreeable to [facility].  Patient/Family's Understanding of and Emotional Response to Diagnosis, Current Treatment, and Prognosis:  Anxious for update about pt status- hopeful pt will be able to DC soon.  Emotional Assessment Appearance:  Appears stated age Attitude/Demeanor/Rapport:  Unable to Assess Affect (typically observed):  Unable to Assess Orientation:  Oriented to Self Alcohol / Substance use:  Not Applicable Psych involvement (Current and /or in the community):  No (Comment)  Discharge Needs  Concerns  to be addressed:  Care Coordination Readmission within the last 30 days:  No Current discharge risk:  None Barriers to Discharge:  Continued Medical Work up   Burna SisUris, Sheva Mcdougle H, LCSW 01/12/2016, 9:23 AM

## 2016-01-13 ENCOUNTER — Telehealth: Payer: Self-pay | Admitting: Family Medicine

## 2016-01-13 DIAGNOSIS — N3000 Acute cystitis without hematuria: Secondary | ICD-10-CM

## 2016-01-13 LAB — BASIC METABOLIC PANEL
ANION GAP: 7 (ref 5–15)
BUN: 14 mg/dL (ref 6–20)
CALCIUM: 9.4 mg/dL (ref 8.9–10.3)
CO2: 31 mmol/L (ref 22–32)
Chloride: 101 mmol/L (ref 101–111)
Creatinine, Ser: 0.51 mg/dL (ref 0.44–1.00)
Glucose, Bld: 110 mg/dL — ABNORMAL HIGH (ref 65–99)
POTASSIUM: 4.1 mmol/L (ref 3.5–5.1)
Sodium: 139 mmol/L (ref 135–145)

## 2016-01-13 LAB — URINE CULTURE

## 2016-01-13 LAB — GLUCOSE, CAPILLARY: Glucose-Capillary: 96 mg/dL (ref 65–99)

## 2016-01-13 LAB — CBC
HEMATOCRIT: 33.8 % — AB (ref 36.0–46.0)
Hemoglobin: 10.6 g/dL — ABNORMAL LOW (ref 12.0–15.0)
MCH: 30.5 pg (ref 26.0–34.0)
MCHC: 31.4 g/dL (ref 30.0–36.0)
MCV: 97.4 fL (ref 78.0–100.0)
PLATELETS: 369 10*3/uL (ref 150–400)
RBC: 3.47 MIL/uL — AB (ref 3.87–5.11)
RDW: 15 % (ref 11.5–15.5)
WBC: 11.3 10*3/uL — AB (ref 4.0–10.5)

## 2016-01-13 MED ORDER — CARVEDILOL 3.125 MG PO TABS: 3.1250 mg | ORAL_TABLET | Freq: Two times a day (BID) | ORAL | 0 refills | Status: DC

## 2016-01-13 MED ORDER — RIVAROXABAN 15 MG PO TABS: 15.0000 mg | ORAL_TABLET | Freq: Two times a day (BID) | ORAL | 0 refills | Status: DC

## 2016-01-13 MED ORDER — DOXYCYCLINE HYCLATE 100 MG PO TABS: 100.0000 mg | ORAL_TABLET | Freq: Two times a day (BID) | ORAL | 0 refills | Status: DC

## 2016-01-13 MED ORDER — RIVAROXABAN 20 MG PO TABS: 20.0000 mg | ORAL_TABLET | Freq: Every day | ORAL | 0 refills | Status: AC

## 2016-01-13 MED ORDER — DOXYCYCLINE HYCLATE 100 MG PO TABS
100.0000 mg | ORAL_TABLET | Freq: Two times a day (BID) | ORAL | Status: DC
  Administered 2016-01-13: 100 mg via ORAL
  Filled 2016-01-13: qty 1

## 2016-01-13 NOTE — Progress Notes (Signed)
[facility] Anticipated discharge date: 10/25 Family notified: Fabian NovemberBobby Sexton Transportation by Sharin MonsPTAR- scheduled at 2:30pm  CSW signing off.  Burna SisJenna H. Termaine Roupp, LCSW Clinical Social Worker [phone]

## 2016-01-13 NOTE — Discharge Summary (Signed)
[facility]  Patient name: [name] Medical record number: [id number] Date of birth: 1940-11-17 Age: 75 y.o. Gender: female Date of Admission: 01/11/2016  Date of Discharge: 01/13/2016 Admitting Physician: Moses MannersWilliam A Hensel, MD  Primary Care Provider: Rodrigo Ranrystal Dorsey, MD Consultants: None  Indication for Hospitalization: shortness of breath  Discharge Diagnoses/Problem List:  Patient Active Problem List   Diagnosis Date Noted  . 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 HCAP (healthcare-associated pneumonia) 01/11/2016  . Hypoxia 01/11/2016  . Accelerated hypertension 01/11/2016  . Pulmonary embolism with acute cor pulmonale (HCC) 01/11/2016  . Pressure injury of skin 01/11/2016  . Prediabetes 01/11/2016  . Thoracic aortic aneurysm (HCC) 01/11/2016  . 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 Pulmonary nodules/lesions, multiple   . Dysuria 11/30/2015  . Anxiety state 09/30/2015  . Weight gain 08/24/2015  . Pruritic intertrigo 07/24/2015  . Hypercalcemia 05/18/2015  . Osteoarthritis 03/11/2015  . Anemia 03/11/2015  . Bone spur of left foot   . 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 Person living in residential institution 12/03/2014  . Diabetes mellitus without complication (HCC) 11/29/2014  . Acute delirium 11/29/2014  . Hyperlipemia   . Essential hypertension   . Fall   . Solitary pulmonary nodule, possible 08/29/2014   Disposition: SNF  Discharge Condition: Stable  Discharge Exam:  General: awake, elderly female Eyes: sclera white, EOMI ENTM: MMM, poor dentition Neck: supple, no LAD Cardiovascular: RRR, no MRG Respiratory: expiratory wheeze on left, no dyspnea with speech, Gastrointestinal: soft, NT/ND, +BS MSK: LE with contractures, no edema; warm Derm: left heel bandaged, sacral lesion also present Neuro: follows commands but needs frequent redirection Psych: mood stable, flat affect  Brief Hospital  Course:  [age] y/o F presenting with hypoxia.  PMH is significant for HTN, HLD, DM2, Anemia, Anxiety, resides in nursing home. Was intially treated for HAP given she is from a nursing home.  D-dimer elevated.  CTA showing right-sided PE.  Possible precipitant immobility.  History of first time VTE. Admitted and placed on tele to look for evidence of a-fib.  Was started on Xarelto on 10/23 for initial 3 month therapy with consideration of long term therapy given her immobility.  Urine strep pna negative.  Urine legionella negative.   Requiring 3 L O2 by [place] while admitted.  With no O2 requirement at her baseline. BP during admission also found to be slightly elevated.  She recently stopped her Cozaar due to neck pain.  Was started on Carvedilol which can be continued at discharge.  The rest of her chronic problems stable during this admission and home medications were continued.  Was attempted to wean once improving and was stable at time of discharge.    Of note, CT findings also showing 4.2 cm ascending thoracic aortic aneurysm. Recommend annual imaging follow up by CTA or MRA.  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 Multiple pulm nodules noted b/l with largest measuring 8 mm in right middle lobe which may represent mets. Recc non-contrast chest CT at 3-6 months. If nodules are stable at time of repeat CT, then future CT at 18-24 months.   Issues for Follow Up:  1. Xarelto to be continued for total of 3 months.  Take 15 mg bid until November 12th.  Then 20 mg daily until January 23rd.  Consider extended anticoagulation given her immobility.  2. Started on new BP medicine Carvedilol.  Will also help with slowing progression of her thoracic aortic aneurysm.  3. To complete total course of Doxycycline 100 mg bid for 7 days for possible UTI.  4. Recommend annual imaging f/u by CTA or MRA for thoracic aortic aneurysm 5. Non-contrast CT rec at 3-6 months because multiple pulm nodules noted  bilaterally with largest measuring 8 mm in right middle lobe which may represent mets.  If nodules are stable at time of repeat CT, then future CT at 18-24 months (from today's scan).  6. Repeat CBC in one week.   Significant Procedures: CT chest  Significant Labs and Imaging:   Recent Labs Lab 01/11/16 0334 01/12/16 0627 01/13/16 0538  WBC 16.7* 14.7* 11.3*  HGB 12.3 11.4* 10.6*  HCT 38.7 35.9* 33.8*  PLT 460* 383 369    Recent Labs Lab 01/11/16 0334 01/12/16 0627 01/13/16 0538  NA 139 134* 139  K 4.1 4.1 4.1  CL 96* 96* 101  CO2 32 25 31  GLUCOSE 124* 144* 110*  BUN 20 17 14   CREATININE 0.53 0.47 0.51  CALCIUM 9.8 9.3 9.4  ALKPHOS 85  --   --   AST 17  --   --   ALT 14  --   --   ALBUMIN 2.4*  --   --    Results/Tests Pending at Time of Discharge: None  Discharge Medications:    Medication List    STOP taking these medications   UNABLE TO FIND     TAKE these medications   acetaminophen 650 MG CR tablet Commonly known as:  TYLENOL Take 650-1,300 mg by mouth See admin instructions. Take 650 mg at 10 AM. Take 1300 mg at 6PM. Take 1300 mg at 2AM.   albuterol 108 (90 Base) MCG/ACT inhaler Commonly known as:  PROVENTIL HFA;VENTOLIN HFA Inhale 2 puffs into the lungs every 6 (six) hours as needed for wheezing or shortness of breath.   ALPRAZolam 0.25 MG tablet Commonly known as:  XANAX Take 0.25 mg by mouth every 8 (eight) hours as needed for anxiety.   bisacodyl 10 MG suppository Commonly known as:  DULCOLAX Place 10 mg rectally as needed for moderate constipation.   calcium citrate 950 MG tablet Commonly known as:  CALCITRATE - dosed in mg elemental calcium Take 200 mg of elemental calcium by mouth daily.   carvedilol 3.125 MG tablet Commonly known as:  COREG Take 1 tablet (3.125 mg total) by mouth 2 (two) times daily with a meal.   Cholecalciferol 2000 units Caps Take 1 capsule (2,000 Units total) by mouth daily.   doxycycline 100 MG  tablet Commonly known as:  VIBRA-TABS Take 1 tablet (100 mg total) by mouth every 12 (twelve) hours.   guaiFENesin 100 MG/5ML Soln Commonly known as:  ROBITUSSIN Take 10 mLs by mouth every 4 (four) hours as needed for cough or to loosen phlegm.   ipratropium-albuterol 0.5-2.5 (3) MG/3ML Soln Commonly known as:  DUONEB Take 3 mLs by nebulization every 4 (four) hours as needed (for wheezing).   magnesium hydroxide 400 MG/5ML suspension Commonly known as:  MILK OF MAGNESIA Take 30 mLs by mouth daily as needed for mild constipation.   nitroGLYCERIN 0.4 MG SL tablet Commonly known as:  NITROSTAT Place 0.4 mg under the tongue every 5 (five) minutes as needed for chest pain.   oxyCODONE 5 MG immediate release tablet Commonly known as:  ROXICODONE Take 0.5 tablets (2.5 mg total) by mouth every 6 (six) hours as needed for severe pain.   RA SALINE ENEMA 19-7 GM/118ML Enem Place 1 each  rectally as needed (for constipation).   Rivaroxaban 15 MG Tabs tablet Commonly known as:  XARELTO Take 1 tablet (15 mg total) by mouth 2 (two) times daily with a meal.   rivaroxaban 20 MG Tabs tablet Commonly known as:  XARELTO Take 1 tablet (20 mg total) by mouth daily with supper. Start taking on:  02/01/2016       Discharge Instructions: Please refer to Patient Instructions section of EMR for full details.  Patient was counseled important signs and symptoms that should prompt return to medical care, changes in medications, dietary instructions, activity restrictions, and follow up appointments.   Follow-Up Appointments:  Freddrick March, MD 01/13/2016, 12:16 PM PGY-1, [facility]

## 2016-01-13 NOTE — Progress Notes (Signed)
Family Medicine Teaching Service Daily Progress Note Intern Pager: [phone]  Patient name: [name] Medical record number: [id number] Date of birth: [birth year] Age: 75 y.o. Gender: female  Primary Care Provider: Rodrigo Ran, MD Consultants: none Code Status: FULL  Pt Overview and Major Events to Date:  Admit 10/23 Vanc d/c 10/24.  D/C levaquin 10/25 Doxy (10/25>)  Assessment and Plan: [name] is a 75 y.o. female presenting with hypoxia . PMH is significant for HTN, HLD, DM2, Anemia, Anxiety, resides in nursing home  # Hypoxia 2/2 Pulmonary embolism.  CTA showing right sided PE. Possible precipitant is immobility.  First time VTE - VS per floor protocol -Started on Xarelto 10/23 - initial therapy for 3 months with consideration of long term therapy given that immobility may not improve significantly - Bcx --> + for Methicillin resistant staph spp, but not staph aureus.  Likely contaminant.  - Urine Strep pna negative, Urine Legionella negative -tele to look for atrial fibrillation - SABA nebs, Albuterol q2 prn -3L o2 supplementation. WEAN as tolerated. She has no baseline o2 requirement.   #?UTI.  Found to have urine cx + for >100,000 colonies of aerococcus urinae -Discontinue Levaquin and change to Doxycycline 100 mg BID x total course of 7 days  #CT findings with recommended f/u  4.2 cm ascending thoracic aortic aneurysm.  Recommend annual imaging f/u by CTA or MRA. Coronary artery calcifications noted suggesting CAD. 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 Multiple pulm nodules noted b/l with largest measuring 8 mm in right middle lobe which may represent mets. Recc non-contrast chest CT at 3-6 months. If nodules are stable at time of repeat CT, then future CT at 18-24 months (from today's scan).  -continue Carvedilol 3.125 mg bid  # Accelerated HTN:  Previously on Cozaar 50 mg but discontinued recently due to neck pain and headache.   11/2015 BP  was well controlled off meds for age. BP this AM 158/75.  - Hydralazine 5mg  q8 prn SBP>180, DBP>110 -Continue Carvedilol 3.125 mg bid - Monitor closely  #Leukocytosis. On admission WBC 16.7.   -WBC this AM 11.3  # DM2: diet controlled. Last A1c 09/2015: 5.7 - Monitor CBGs in setting of acute illness/ s/p steroids.  Have been high 100s.   # Anemia: hgb at baseline 12.3.  Hgb this AM 10.6  # Anxiety: has Xanax prn but often does not require - Consider Ativan prn here but limit benzos as able in the setting of respiratory illness  # Heel wound/ sacral wound: - c/s wound care  # Nursing home resident: Verdis Prime - c/s CSW to assist with [facility] to [facility] at discharge  FEN/GI: HH/ Carb mod diet Prophylaxis: Lovenox sub-q  Disposition:  Dispo SNF pending clinical improvement.   Subjective:  Patient laying in bed, talking about spiders.  Oriented to place and person but not time. No complaints at this time and is more alert than previously seen.   Objective: Temp:  [97.5 F (36.4 C)-98.2 F (36.8 C)] 97.5 F (36.4 C) (10/25 0654) Pulse Rate:  [73-89] 73 (10/25 0721) Resp:  [17-18] 18 (10/25 0654) BP: (137-181)/(71-100) 158/75 (10/25 0721) SpO2:  [95 %-100 %] 96 % (10/25 0654)   Physical Exam: General: awake, elderly female Eyes: sclera white, EOMI ENTM: MMM, poor dentition Neck: supple, no LAD Cardiovascular: RRR, no MRG Respiratory: expiratory wheeze on left, no dyspnea with speech, Gastrointestinal: soft, NT/ND, +BS MSK: LE with contractures, no edema; warm Derm: left heel bandaged, sacral  lesion also present Neuro: follows commands but needs frequent redirection Psych: mood stable, flat affect  Laboratory:  Recent Labs Lab 01/11/16 0334 01/12/16 0627 01/13/16 0538  WBC 16.7* 14.7* 11.3*  HGB 12.3 11.4* 10.6*  HCT 38.7 35.9* 33.8*  PLT 460* 383 369    Recent Labs Lab 01/11/16 0334 01/12/16 0627 01/13/16 0538  NA 139 134* 139  K 4.1 4.1 4.1   CL 96* 96* 101  CO2 32 25 31  BUN 20 17 14   CREATININE 0.53 0.47 0.51  CALCIUM 9.8 9.3 9.4  PROT 6.3*  --   --   BILITOT 0.7  --   --   ALKPHOS 85  --   --   ALT 14  --   --   AST 17  --   --   GLUCOSE 124* 144* 110*   Imaging/Diagnostic Tests: Ct Angio Chest Pe W Or Wo Contrast  Result Date: 01/11/2016 CLINICAL DATA:  Wheezing. EXAM: CT ANGIOGRAPHY CHEST WITH CONTRAST TECHNIQUE: Multidetector CT imaging of the chest was performed using the standard protocol during bolus administration of intravenous contrast. Multiplanar CT image reconstructions and MIPs were obtained to evaluate the vascular anatomy. CONTRAST:  100 mL of Isovue 370 intravenously. COMPARISON:  Radiograph of same day. FINDINGS: Cardiovascular: Atherosclerosis of thoracic aorta is noted. 4.2 cm ascending thoracic aortic aneurysm is noted. No definite dissection is noted. Filling defects are noted in the upper lobe branches of the right pulmonary artery consistent with acute pulmonary emboli. 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 Coronary artery calcifications are noted. RV/LV ratio of 0.7 is noted. Mediastinum/Nodes: No mediastinal mass or adenopathy is noted. Lungs/Pleura: No pneumothorax or pleural effusion is noted. 6 mm nodule is noted inferiorly in the lingular segment of left upper lobe best seen on image number 71 of series 5. Multiple other nodules are noted bilaterally. 8 mm nodule is noted inferiorly in right middle lobe best seen on image number 79 of series 5. Calcified granuloma is noted laterally in right upper lobe. Upper Abdomen: No definite abnormality seen in the visualized upper portion of the abdomen. 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 Review of the MIP images confirms the above findings. IMPRESSION: Aortic atherosclerosis. 4.2 cm ascending thoracic aortic aneurysm. Recommend annual imaging followup by CTA or  MRA. This recommendation follows 2010 ACCF/AHA/AATS/ACR/ASA/SCA/SCAI/SIR/STS/SVM Guidelines for the Diagnosis and Management of Patients with Thoracic Aortic Disease. Circulation. 2010; 121: N562-Z308. Coronary artery calcifications are noted suggesting coronary artery disease. 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 Multiple pulmonary nodules are noted bilaterally with the largest measuring 8 mm in right middle lobe which may represent metastatic disease. Non-contrast chest CT at 3-6 months is recommended. If the nodules are stable at time of repeat CT, then future CT at 18-24 months (from today's scan) is considered optional for low-risk patients, but is recommended for high-risk patients. This recommendation follows the consensus statement: Guidelines for Management of Incidental Pulmonary Nodules Detected on CT Images: From the Fleischner Society 2017; Radiology 2017; 284:228-243. Acute pulmonary emboli are noted in upper lobe branches of right pulmonary artery. Critical Value/emergent results were called by telephone at the time of interpretation on 01/11/2016 at 9:09 am to Dr. Delynn Flavin, who verbally acknowledged these results. Electronically Signed   By: Lupita Raider, M.D.   On: 01/11/2016 09:10   Dg Chest Port 1 View  Result Date: 01/11/2016 CLINICAL DATA:  75 year old female with  shortness of breath EXAM: PORTABLE CHEST 1 VIEW COMPARISON:  Chest radiograph dated 05/04/2014 FINDINGS: Shallow inspiration. Left lung base atelectatic changes versus less likely infiltrate. The right lung is clear. There is no pleural effusion or pneumothorax. Stable cardiomegaly. Atherosclerotic calcification of the aortic arch. No acute osseous pathology. IMPRESSION: Left lung base atelectatic changes. Pneumonia is less likely. Clinical correlation is recommended. Electronically Signed   By: Elgie CollardArash  Radparvar M.D.   On: 01/11/2016 05:12   Freddrick MarchYashika Amarianna Abplanalp, MD 01/13/2016,  9:20 AM PGY-1, [facility] FPTS Intern pager: [phone], text pages welcome

## 2016-01-13 NOTE — Progress Notes (Signed)
Pt prepared for d/c to SNF. IV d/c'd. Skin intact except as charted in most recent assessments. Vitals are stable. Report called to receiving facility. Pt to be transported by ambulance service. 

## 2016-01-13 NOTE — Telephone Encounter (Signed)
[facility]. Pt recently started on Xarelto, Coreg, and doxycyline for 7 days.   Joanna Puffrystal S. Dorsey, MD [facility] Resident  01/13/2016, 7:37 PM

## 2016-01-14 LAB — CULTURE, BLOOD (ROUTINE X 2)

## 2016-01-15 ENCOUNTER — Encounter: Payer: Self-pay | Admitting: Family Medicine

## 2016-01-15 ENCOUNTER — Non-Acute Institutional Stay (INDEPENDENT_AMBULATORY_CARE_PROVIDER_SITE_OTHER): Payer: Medicare Other | Admitting: Family Medicine

## 2016-01-15 DIAGNOSIS — R918 Other nonspecific abnormal finding of lung field: Secondary | ICD-10-CM

## 2016-01-15 DIAGNOSIS — I712 Thoracic aortic aneurysm, without rupture, unspecified: Secondary | ICD-10-CM

## 2016-01-15 DIAGNOSIS — I10 Essential (primary) hypertension: Secondary | ICD-10-CM

## 2016-01-15 DIAGNOSIS — R300 Dysuria: Secondary | ICD-10-CM

## 2016-01-15 DIAGNOSIS — H269 Unspecified cataract: Secondary | ICD-10-CM

## 2016-01-15 DIAGNOSIS — I2609 Other pulmonary embolism with acute cor pulmonale: Secondary | ICD-10-CM

## 2016-01-15 DIAGNOSIS — R7881 Bacteremia: Secondary | ICD-10-CM

## 2016-01-15 LAB — D-DIMER, QUANTITATIVE (NOT AT ARMC): D DIMER QUANT: 1.12 — AB

## 2016-01-15 NOTE — Assessment & Plan Note (Signed)
[facility] Will treat BP with goal <140/80. Follow up CTA in 1 year.

## 2016-01-15 NOTE — Assessment & Plan Note (Signed)
Being currently treated with a course of doxycyline.

## 2016-01-15 NOTE — Progress Notes (Signed)
[place]  Visit  Primary Care Provider: Rodrigo Ran, MD Location of Care: [facility] Visit Information: Readmission to [facility] after hospital admission Source(s) of information for visit: patient  Chief Complaint: Hypoxia  History of Present Illness:  Patient was admitted to [facility] on 10/23 with hypoxia and presumed HCAP given that she had an infiltrate on her initial CXR. A CTA was performed which showed a right sided PE. She was started on xarelto for a 3 month course and discharged back to [place].  She was also noted to have elevated BP during her admission and was started on carvedilol.  She was incidentally found to have a 4.2cm ascending thoracic aneurysm on her CTA and multiple pulmonary nodules bilaterally, with the largest measuring 8mm.  She was also treated for a presumed UTI with a course of doxycycline (end date 10/30).   Since returning to [facility], the patient has done well. No chest pain. No shortness of breath. No dyspnea.   Nursing Concerns: None  Behavioral Concerns: None  Nutrition Concerns: None  Wound Care Nurse Concerns: None  PT Concerns and Goals: None yet.   OT Concerns and Goals: None yet.   Physical Restraint: None.    If SNF admission, patient's goal for the rehabilitation admission:   increase overall strength and endurance     Family Goals: none yet   Medications:  Outpatient Encounter Prescriptions as of 01/15/2016  Medication Sig  . acetaminophen (TYLENOL) 650 MG CR tablet Take 650-1,300 mg by mouth See admin instructions. Take 650 mg at 10 AM. Take 1300 mg at [facility]. Take 1300 mg at 2AM.  . albuterol (PROVENTIL HFA;VENTOLIN HFA) 108 (90 BASE) MCG/ACT inhaler Inhale 2 puffs into the lungs every 6 (six) hours as needed for wheezing or shortness of breath.  . ALPRAZolam (XANAX) 0.25 MG tablet Take 0.25 mg by mouth every 8 (eight) hours as needed for anxiety.  . bisacodyl (DULCOLAX) 10 MG suppository Place  10 mg rectally as needed for moderate constipation.  . calcium citrate (CALCITRATE - DOSED IN MG ELEMENTAL CALCIUM) 950 MG tablet Take 200 mg of elemental calcium by mouth daily.  . carvedilol (COREG) 3.125 MG tablet Take 1 tablet (3.125 mg total) by mouth 2 (two) times daily with a meal.  . Cholecalciferol 2000 units CAPS Take 1 capsule (2,000 Units total) by mouth daily.  Marland Kitchen doxycycline (VIBRA-TABS) 100 MG tablet Take 1 tablet (100 mg total) by mouth every 12 (twelve) hours.  Marland Kitchen guaiFENesin (ROBITUSSIN) 100 MG/5ML SOLN Take 10 mLs by mouth every 4 (four) hours as needed for cough or to loosen phlegm.  Marland Kitchen ipratropium-albuterol (DUONEB) 0.5-2.5 (3) MG/3ML SOLN Take 3 mLs by nebulization every 4 (four) hours as needed (for wheezing).  . magnesium hydroxide (MILK OF MAGNESIA) 400 MG/5ML suspension Take 30 mLs by mouth daily as needed for mild constipation.  . nitroGLYCERIN (NITROSTAT) 0.4 MG SL tablet Place 0.4 mg under the tongue every 5 (five) minutes as needed for chest pain.  Marland Kitchen oxyCODONE (ROXICODONE) 5 MG immediate release tablet Take 0.5 tablets (2.5 mg total) by mouth every 6 (six) hours as needed for severe pain.  . Rivaroxaban (XARELTO) 15 MG TABS tablet Take 1 tablet (15 mg total) by mouth 2 (two) times daily with a meal.  . [START ON 02/01/2016] rivaroxaban (XARELTO) 20 MG TABS tablet Take 1 tablet (20 mg total) by mouth daily with supper.  . Sodium Phosphates (RA SALINE ENEMA) 19-7 GM/118ML ENEM Place 1 each rectally as needed (  for constipation).   No facility-administered encounter medications on file as of 01/15/2016.    Allergies  Allergen Reactions  . Aspirin Other (See Comments)    Bleeding   . Latex Itching  . Penicillins Rash    Has patient had a PCN reaction causing immediate rash, facial/tongue/throat swelling, SOB or lightheadedness with hypotension: No Has patient had a PCN reaction causing severe rash involving mucus membranes or skin necrosis: No Has patient had a PCN  reaction that required hospitalization No Has patient had a PCN reaction occurring within the last 10 years: No If all of the above answers are "NO", then may proceed with Cephalosporin use.    History Patient Active Problem List   Diagnosis Date Noted  . Positive blood culture 01/15/2016  . Acute cystitis without hematuria   . 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 Hypoxia 01/11/2016  . Pulmonary embolism with acute cor pulmonale (HCC) 01/11/2016  . Pressure injury of skin 01/11/2016  . Prediabetes 01/11/2016  . Thoracic aortic aneurysm (HCC) 01/11/2016  . 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 Pulmonary nodules/lesions, multiple   . Dysuria 11/30/2015  . Anxiety state 09/30/2015  . Weight gain 08/24/2015  . Pruritic intertrigo 07/24/2015  . Hypercalcemia 05/18/2015  . Osteoarthritis 03/11/2015  . Anemia 03/11/2015  . Bone spur of left foot   . 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 Person living in residential institution 12/03/2014  . Diabetes mellitus without complication (HCC) 11/29/2014  . Acute delirium 11/29/2014  . Hyperlipemia   . Essential hypertension   . Fall   . Solitary pulmonary nodule, possible 08/29/2014   Past Medical History:  Diagnosis Date  . Bone spur of left foot   . Depression   . Diabetes mellitus   . Diabetes mellitus without complication (HCC) 11/29/2014  . Essential hypertension   . Fall   . 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 Gout   . Hemorrhoid   . Hypercalcemia 05/18/2015  . Hyperlipemia   . Hypertension   . Iron deficiency anemia secondary to blood loss (chronic)   . 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 Obesity   . Osteoarthritis   . Person living in residential institution 12/03/2014  . Prediabetes 01/11/2016  . 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 Pulmonary nodules/lesions, multiple   . Solitary pulmonary nodule 01/07/2015  . Thoracic aortic  aneurysm (HCC) 01/11/2016  . UTI (lower urinary tract infection) 11/29/2014   Past Surgical History:  Procedure Laterality Date  . KNEE SURGERY Right    Family History  Problem Relation Age of Onset  . Heart attack Mother   . Diabetes Father     reports that she has quit smoking. She does not have any smokeless tobacco history on file.  Diet:  general Feeding Tube: No  Hydration Status: well hydrated  Communication Barriers: none identified  Review of Systems  Patient has ability to communicate answers to ROS: yes See HPI  PHYSICAL EXAM:. Wt Readings from Last 3 Encounters:  01/12/16 179 lb 14.3 oz (81.6 kg)  08/24/15 180 lb (81.6 kg)  11/29/14 147 lb 14.9 oz (67.1 kg)   Temp Readings from Last 3 Encounters:  01/13/16 97.5 F (36.4 C) (Oral)  12/02/14 97.8 F (36.6 C) (Oral)  05/25/11 98.3 F (36.8 C) (Oral)   BP Readings from Last 3 Encounters:  01/15/16 (!) 148/82  01/13/16 (!) 152/64  08/26/15 151/89   Pulse Readings from Last  3 Encounters:  01/15/16 80  01/13/16 81  08/26/15 68    General: Alert, cooperative, talkative. Clean grooemed. HEENT:  No scleral icterus, no nasal secretions, EACs not occluded, TMs clear bilat, [place] moist and no erythema or lesion Neck:  Supple, No JVD, no lymphadenopathy CV:  RRR, no murmur, RESP: No resp distress or accessory muscle use.  Clear to ausc bilat. No wheezing, no rales, no rhonchi.  ABD:  Soft, Non-tender, non-distended, +bowel sounds, umbilical hernia noted MSK:  No back pain, no joint pain.  No joint swelling or redness EXT: Warm and well perfused. Pulses WNL Gait:  Not tested Skin: No obvious lesions or rashes noted.  Neurologic:CN2-12 grossly intact. Alert and talkative. Moves all extremities. Sensation to light touch intact throughout.  Psych: Oriented to person, place, time, and general circumstances. Insight intact. Good judgement. Normal attention. Normal thought content.   Assessment and Plan:    See Problem List for individual problem's assessment and plans.   Family communications: Son [name]([name]): [phone]  Code Status:    DNR  Intravenous Fluids:  Yes Feeding Tubes: Yes Antibiotics: Yes Hospitalization: Yes

## 2016-01-15 NOTE — Assessment & Plan Note (Signed)
Currently on xarelto 15mg  bid. Will switch to 20mg  daily on 02/01/2016 and stop on 04/12/2016. Will need follow up CBC next week.

## 2016-01-15 NOTE — Assessment & Plan Note (Signed)
Incidentally found on CTA while at [facility]. Will need repeat noncontrast CT in 3-6 months, and then every 18-24 months afterwards if stable.

## 2016-01-15 NOTE — Assessment & Plan Note (Signed)
Patient restarted on carvedilol 3.125 while hospitalized with goal BP <140/80 due to thoracic aneurysm. Slightly above that goal based on last BP reading, though a few prior were at goal. Will continue to monitor. Increase carvedilol if persistently above 140/80

## 2016-01-15 NOTE — Assessment & Plan Note (Signed)
Thought to be contaminant. Will need repeat blood culture 3 days after finishing course of doxycycline (01/21/2016).

## 2016-01-16 LAB — CULTURE, BLOOD (ROUTINE X 2): Culture: NO GROWTH

## 2016-01-25 ENCOUNTER — Encounter: Payer: Self-pay | Admitting: Family Medicine

## 2016-01-25 LAB — CBC & DIFF AND RETIC
HEMATOCRIT: 35 %
Hemoglobin: 11.1
LYMPHS PCT: 2280
NEUTROS ABS: 4484
NEUTROS PCT: 59
PLATELETS: 291
RBC: 3.64
WBC: 7.6

## 2016-01-27 ENCOUNTER — Non-Acute Institutional Stay: Payer: Medicare Other | Admitting: Family Medicine

## 2016-01-27 ENCOUNTER — Other Ambulatory Visit: Payer: Self-pay | Admitting: Family Medicine

## 2016-01-27 DIAGNOSIS — R7881 Bacteremia: Secondary | ICD-10-CM

## 2016-01-27 DIAGNOSIS — I2609 Other pulmonary embolism with acute cor pulmonale: Secondary | ICD-10-CM | POA: Diagnosis not present

## 2016-01-27 DIAGNOSIS — I712 Thoracic aortic aneurysm, without rupture, unspecified: Secondary | ICD-10-CM

## 2016-01-27 DIAGNOSIS — M7752 Other enthesopathy of left foot: Secondary | ICD-10-CM

## 2016-01-27 DIAGNOSIS — I2721 Secondary pulmonary arterial hypertension: Secondary | ICD-10-CM

## 2016-01-27 DIAGNOSIS — E119 Type 2 diabetes mellitus without complications: Secondary | ICD-10-CM

## 2016-01-27 DIAGNOSIS — M1990 Unspecified osteoarthritis, unspecified site: Secondary | ICD-10-CM

## 2016-01-27 DIAGNOSIS — R918 Other nonspecific abnormal finding of lung field: Secondary | ICD-10-CM

## 2016-01-27 DIAGNOSIS — F411 Generalized anxiety disorder: Secondary | ICD-10-CM

## 2016-01-27 DIAGNOSIS — L8995 Pressure ulcer of unspecified site, unstageable: Secondary | ICD-10-CM

## 2016-01-27 DIAGNOSIS — I10 Essential (primary) hypertension: Secondary | ICD-10-CM

## 2016-01-27 NOTE — Progress Notes (Signed)
Patient ID: [name], female   DOB: [birth year], 75 y.o.   MRN: [id number] [facility]  Visit  Primary Care Provider: Rodrigo Ran, MD Location of Care: [facility] Visit Information: a scheduled routine follow-up visit Patient accompanied by no one Source(s) of information for visit: patient and EMR   Chief Complaint:     Since our last visit, the patient has been hospitalized. At that time she was noted to have a pulmonary embolism. She was started on Xarelto for 3 month course. 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 Her blood pressures were elevated during admission, therefore Coreg was started given the new findings of aneurysm.   Since that time, the patient states she is doing well. She has DuoNeb's as needed, and states that she feels these are very helpful.  She denies any fevers, chills, chest pain, shortness of breath.   She denies any dysuria, she's completed her course of doxycycline for presumed urinary tract infection.  She had significant tenderness over her pressure ulcers, but states that she is happy with the wound care nurse who is working on her right ankle. She states that she had a question to ask a provider, but at this time cannot recall. She will write it down if she remembers it.   Nutrition Concerns: Patient notes she's doing well with eating.   Wound Care Nurse Concerns: currently getting changes regularly   PT Concerns and Goals: will evaluate and treat as pt was noted to be hypoxic with movement in the hospital.   HISTORY OF PRESENT ILLNESS: Outpatient Encounter Prescriptions as of 01/27/2016  Medication Sig  . acetaminophen (TYLENOL) 650 MG CR tablet Take 650-1,300 mg by mouth See admin instructions. Take 650 mg at 10 AM. Take 1300 mg at [facility]. Take 1300 mg at 2AM.  . albuterol (PROVENTIL HFA;VENTOLIN HFA) 108 (90  BASE) MCG/ACT inhaler Inhale 2 puffs into the lungs every 6 (six) hours as needed for wheezing or shortness of breath.  . ALPRAZolam (XANAX) 0.25 MG tablet Take 0.25 mg by mouth every 8 (eight) hours as needed for anxiety.  . bisacodyl (DULCOLAX) 10 MG suppository Place 10 mg rectally as needed for moderate constipation.  . calcium citrate (CALCITRATE - DOSED IN MG ELEMENTAL CALCIUM) 950 MG tablet Take 200 mg of elemental calcium by mouth daily.  . carvedilol (COREG) 3.125 MG tablet Take 1 tablet (3.125 mg total) by mouth 2 (two) times daily with a meal.  . Cholecalciferol 2000 units CAPS Take 1 capsule (2,000 Units total) by mouth daily.  Marland Kitchen doxycycline (VIBRA-TABS) 100 MG tablet Take 1 tablet (100 mg total) by mouth every 12 (twelve) hours.  Marland Kitchen guaiFENesin (ROBITUSSIN) 100 MG/5ML SOLN Take 10 mLs by mouth every 4 (four) hours as needed for cough or to loosen phlegm.  . magnesium hydroxide (MILK OF MAGNESIA) 400 MG/5ML suspension Take 30 mLs by mouth daily as needed for mild constipation.  . nitroGLYCERIN (NITROSTAT) 0.4 MG SL tablet Place 0.4 mg under the tongue every 5 (five) minutes as needed for chest pain.  . Rivaroxaban (XARELTO) 15 MG TABS tablet Take 1 tablet (15 mg total) by mouth 2 (two) times daily with a meal.  . [START ON 02/01/2016] rivaroxaban (XARELTO) 20 MG TABS tablet Take 1 tablet (20 mg total) by mouth daily with supper.  . Sodium Phosphates (RA SALINE ENEMA) 19-7 GM/118ML ENEM Place 1 each  rectally as needed (for constipation).  . [DISCONTINUED] ipratropium-albuterol (DUONEB) 0.5-2.5 (3) MG/3ML SOLN Take 3 mLs by nebulization every 4 (four) hours as needed (for wheezing).  . [DISCONTINUED] oxyCODONE (ROXICODONE) 5 MG immediate release tablet Take 0.5 tablets (2.5 mg total) by mouth every 6 (six) hours as needed for severe pain.   No facility-administered encounter medications on file as of 01/27/2016.    Allergies  Allergen Reactions  . Aspirin Other (See Comments)    Bleeding    . Latex Itching  . Penicillins Rash    Has patient had a PCN reaction causing immediate rash, facial/tongue/throat swelling, SOB or lightheadedness with hypotension: No Has patient had a PCN reaction causing severe rash involving mucus membranes or skin necrosis: No Has patient had a PCN reaction that required hospitalization No Has patient had a PCN reaction occurring within the last 10 years: No If all of the above answers are "NO", then may proceed with Cephalosporin use.    History Patient Active Problem List   Diagnosis Date Noted  . Positive blood culture 01/15/2016  . Acute cystitis without hematuria   . 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 Hypoxia 01/11/2016  . Pulmonary embolism with acute cor pulmonale (HCC) 01/11/2016  . Pressure injury of skin 01/11/2016  . Prediabetes 01/11/2016  . Thoracic aortic aneurysm (HCC) 01/11/2016  . 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 Pulmonary nodules/lesions, multiple   . Anxiety state 09/30/2015  . Weight gain 08/24/2015  . Pruritic intertrigo 07/24/2015  . Hypercalcemia 05/18/2015  . Osteoarthritis 03/11/2015  . Anemia 03/11/2015  . Bone spur of left foot   . 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 Person living in residential institution 12/03/2014  . Diabetes mellitus without complication (HCC) 11/29/2014  . Acute delirium 11/29/2014  . Hyperlipemia   . Essential hypertension   . Fall   . Solitary pulmonary nodule, possible 08/29/2014   Past Medical History:  Diagnosis Date  . Bone spur of left foot   . Depression   . Diabetes mellitus   . Diabetes mellitus without complication (HCC) 11/29/2014  . Essential hypertension   . Fall   . 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 Gout   . Hemorrhoid   . Hypercalcemia 05/18/2015  . Hyperlipemia   . Hypertension   . Iron deficiency anemia secondary to blood loss (chronic)   . 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 Obesity   . Osteoarthritis   . Person living in  residential institution 12/03/2014  . Prediabetes 01/11/2016  . 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 Pulmonary nodules/lesions, multiple   . Solitary pulmonary nodule 01/07/2015  . Thoracic aortic aneurysm (HCC) 01/11/2016  . UTI (lower urinary tract infection) 11/29/2014   Past Surgical History:  Procedure Laterality Date  . KNEE SURGERY Right    Family History  Problem Relation Age of Onset  . Heart attack Mother   . Diabetes Father     reports that she has quit smoking. She does not have any smokeless tobacco history on file.    Diet:  mechanical soft  Review of Systems  Patient has ability to communicate answers to ROS: yes See HPI  Geriatric Syndromes: Constipation no  Incontinence yes, wears adult diapers Dizziness no   Syncope no   Skin problems yes- pressure ulcers   Visual Impairment no   Hearing impairment yes (stable) Eating impairment yes- dentures, poor dentition Impaired Memory or Cognition: no, attention  is poor and cannot perform MOCA.    Behavioral problems no Sleep problems no   Weight loss no    Pain:  Pain Location: in her legs bilaterally  Pain Rating: 5/10 currently  Pain Duration: for "some time" Pain Therapies: Tylenol, oxycodone 2.5mg  q6hrs PRN severe pain  Pain Response to Therapies: mild Bowel Movement Difficulty: no  Dyspnea: Dyspnea Rating: denies currently Dyspnea Goal: none Dyspnea Therapies: albuterol PRN Dyspnea Response to Therapies: good   General: Denies fevers, chills, progressive fatigue, weight gain.  Eyes: Denies pain, blurred vision  Ears/Nose/Throat: Denies ear pain, throat pain, rhinorrhea, nasal congestion.  Cardiovascular: Denies chest pains, palpitations, dyspnea on exertion, orthopnea, peripheral edema.  Respiratory: Denies cough, sputum, dyspnea  Gastrointestinal: Denies abdominal pain, bloating, constipation, diarrhea.  Genitourinary:  Denies discharge Musculoskeletal: Endorses joint pain in the LE. Denies swelling, weakness.  Skin: Denies skin rash or ulcers. Neurologic: Denies transient paralysis, weakness, paresthesias, headache.  Psychiatric: Denies depression, anxiety, psychosis.   Endocrine: Denies weight loss   PHYSICAL EXAM:. BP: 132/68 HR: 68   General: alert, cooperative, well nourished, pleasant, clean, groomed.  HENT:  No scleral icterus, no nasal secretions, Oral mucosa moist and no erythema or lesion. Dentures on top, poor dentition on bottom.  Eyes: cataract on the right  Neck:  Supple, No JVD, no lymphadenopathy CV:  RRR, no murmur RESP: No resp distress or accessory muscle use.  Clear to ausc bilat. No wheezing, no rales, no rhonchi.  ABD:  Soft, non-distended, +bowel sounds. No tenderness to palpation.  Easily reducible umbilical hernia. No CVA tenderness.  MSK:  No back pain.  EXT: Warm and well perfused. Pulses WNL. Tenderness to palpation over the LEs diffusely. No swelling. Right heel/ankle well wrapped, not taken down.  Gait:  Not tested Neurologic:Cranial nerves normal besides decreased hearing;  Mild muscle atrophy.  Motor Strength: generalized weakness, 4/5 strength in the hips bilaterally.   Psych:  Orientation: oriented to person, place, time, and general circumstances; Judgment Good. Insight Intact Memory recent and remote memory intact; Attention Normal;  Mood appropriate; Speech normal;  Thought Coherent   Assessment and Plan:   See Problem List for individual problem's assessment and plans.    Code Status:     FULL CODE

## 2016-01-29 ENCOUNTER — Encounter: Payer: Self-pay | Admitting: Family Medicine

## 2016-01-29 NOTE — Assessment & Plan Note (Signed)
Asymptomatic currently. Patient intermittently asking for Duonebs. CBC unremarkable. Currently on Xarelto 15mg  BID, will transition to 20mg  daily on 02/01/16.

## 2016-01-29 NOTE — Assessment & Plan Note (Signed)
Continue with wound care

## 2016-01-29 NOTE — Assessment & Plan Note (Signed)
Given thoracic aneurysm note on CTA, would like BP >140/80. BPs have been at goal per HL staff.  - continue Coreg 3.125mg 

## 2016-01-29 NOTE — Assessment & Plan Note (Signed)
Adequate glycemic control off pharmacologics medications. - continue to check CBGs weekly - no ASA due to h/o GI bleed - no ACE or ARB due to side effects

## 2016-01-29 NOTE — Assessment & Plan Note (Signed)
Keeping heels elevated. Intermittently gets L heel wrapped with ACE bandage

## 2016-01-29 NOTE — Assessment & Plan Note (Signed)
During hospitalization patient switched from tramadol to oxycodone. Still with somewhat significant pain. - continue scheduled Tylenol  - oxyIR 2.5mg  q6hrs PRN severe pain

## 2016-01-29 NOTE — Assessment & Plan Note (Signed)
Incidentally noted on CTA.  - repeat non-contrast CT in 3-6 months (b/t 03/2016-06/2016), then every 18-24 months after that if stable.

## 2016-01-29 NOTE — Assessment & Plan Note (Signed)
Incidentally noted on CTA. BP goal <140/80. - f/u CTA in 1 year (12/2016)

## 2016-01-29 NOTE — Assessment & Plan Note (Signed)
No issues recently. - xanax PRN but hasn't required

## 2016-01-29 NOTE — Assessment & Plan Note (Signed)
Most likely from PE (note on CTA).

## 2016-01-29 NOTE — Assessment & Plan Note (Signed)
Thought to be a contaminant. patient has completed doxycycline course. Blood culture drawn on 11/7 per [facility] nurse, hasn't been resulted by the time of regulatory visit. - geriatrics resident to f/u blood culture results.

## 2016-02-01 ENCOUNTER — Encounter: Payer: Self-pay | Admitting: Family Medicine

## 2016-02-01 LAB — CULTURE, BLOOD (SINGLE)

## 2016-02-08 NOTE — Addendum Note (Signed)
Addended by: Acquanetta BellingMCDIARMID, Aravind Chrismer D on: 02/08/2016 08:18 AM   Modules accepted: Orders

## 2016-03-23 ENCOUNTER — Emergency Department (HOSPITAL_COMMUNITY): Payer: Medicare Other

## 2016-03-23 ENCOUNTER — Inpatient Hospital Stay (HOSPITAL_COMMUNITY): Payer: Medicare Other

## 2016-03-23 ENCOUNTER — Encounter: Payer: Self-pay | Admitting: Family Medicine

## 2016-03-23 ENCOUNTER — Non-Acute Institutional Stay: Payer: Medicare Other | Admitting: Family Medicine

## 2016-03-23 ENCOUNTER — Inpatient Hospital Stay (HOSPITAL_COMMUNITY)
Admission: EM | Admit: 2016-03-23 | Discharge: 2016-03-29 | DRG: 189 | Disposition: A | Discharge status: Skilled Nursing Facility | Payer: Medicare Other | Attending: Family Medicine | Admitting: Family Medicine

## 2016-03-23 DIAGNOSIS — Z880 Allergy status to penicillin: Secondary | ICD-10-CM

## 2016-03-23 DIAGNOSIS — R0602 Shortness of breath: Secondary | ICD-10-CM | POA: Diagnosis not present

## 2016-03-23 DIAGNOSIS — Z79899 Other long term (current) drug therapy: Secondary | ICD-10-CM

## 2016-03-23 DIAGNOSIS — E11622 Type 2 diabetes mellitus with other skin ulcer: Secondary | ICD-10-CM | POA: Diagnosis present

## 2016-03-23 DIAGNOSIS — F411 Generalized anxiety disorder: Secondary | ICD-10-CM | POA: Diagnosis present

## 2016-03-23 DIAGNOSIS — Z8679 Personal history of other diseases of the circulatory system: Secondary | ICD-10-CM

## 2016-03-23 DIAGNOSIS — R159 Full incontinence of feces: Secondary | ICD-10-CM | POA: Diagnosis present

## 2016-03-23 DIAGNOSIS — I2721 Secondary pulmonary arterial hypertension: Secondary | ICD-10-CM | POA: Diagnosis present

## 2016-03-23 DIAGNOSIS — R197 Diarrhea, unspecified: Secondary | ICD-10-CM | POA: Diagnosis present

## 2016-03-23 DIAGNOSIS — A0472 Enterocolitis due to Clostridium difficile, not specified as recurrent: Secondary | ICD-10-CM | POA: Diagnosis present

## 2016-03-23 DIAGNOSIS — E669 Obesity, unspecified: Secondary | ICD-10-CM | POA: Diagnosis present

## 2016-03-23 DIAGNOSIS — E870 Hyperosmolality and hypernatremia: Secondary | ICD-10-CM | POA: Diagnosis present

## 2016-03-23 DIAGNOSIS — R6889 Other general symptoms and signs: Secondary | ICD-10-CM

## 2016-03-23 DIAGNOSIS — R0603 Acute respiratory distress: Secondary | ICD-10-CM

## 2016-03-23 DIAGNOSIS — R0600 Dyspnea, unspecified: Secondary | ICD-10-CM | POA: Diagnosis not present

## 2016-03-23 DIAGNOSIS — Z87891 Personal history of nicotine dependence: Secondary | ICD-10-CM

## 2016-03-23 DIAGNOSIS — F0390 Unspecified dementia without behavioral disturbance: Secondary | ICD-10-CM | POA: Diagnosis present

## 2016-03-23 DIAGNOSIS — E872 Acidosis: Secondary | ICD-10-CM | POA: Diagnosis present

## 2016-03-23 DIAGNOSIS — J111 Influenza due to unidentified influenza virus with other respiratory manifestations: Secondary | ICD-10-CM | POA: Diagnosis present

## 2016-03-23 DIAGNOSIS — A09 Infectious gastroenteritis and colitis, unspecified: Secondary | ICD-10-CM | POA: Diagnosis not present

## 2016-03-23 DIAGNOSIS — I712 Thoracic aortic aneurysm, without rupture: Secondary | ICD-10-CM | POA: Diagnosis present

## 2016-03-23 DIAGNOSIS — I10 Essential (primary) hypertension: Secondary | ICD-10-CM | POA: Diagnosis present

## 2016-03-23 DIAGNOSIS — Z7901 Long term (current) use of anticoagulants: Secondary | ICD-10-CM

## 2016-03-23 DIAGNOSIS — Z886 Allergy status to analgesic agent status: Secondary | ICD-10-CM

## 2016-03-23 DIAGNOSIS — Z91040 Latex allergy status: Secondary | ICD-10-CM

## 2016-03-23 DIAGNOSIS — R32 Unspecified urinary incontinence: Secondary | ICD-10-CM | POA: Diagnosis present

## 2016-03-23 DIAGNOSIS — A419 Sepsis, unspecified organism: Secondary | ICD-10-CM | POA: Diagnosis not present

## 2016-03-23 DIAGNOSIS — Z8249 Family history of ischemic heart disease and other diseases of the circulatory system: Secondary | ICD-10-CM

## 2016-03-23 DIAGNOSIS — A0811 Acute gastroenteropathy due to Norwalk agent: Secondary | ICD-10-CM

## 2016-03-23 DIAGNOSIS — L89629 Pressure ulcer of left heel, unspecified stage: Secondary | ICD-10-CM | POA: Diagnosis present

## 2016-03-23 DIAGNOSIS — Z833 Family history of diabetes mellitus: Secondary | ICD-10-CM

## 2016-03-23 DIAGNOSIS — Z6835 Body mass index (BMI) 35.0-35.9, adult: Secondary | ICD-10-CM

## 2016-03-23 DIAGNOSIS — G9340 Encephalopathy, unspecified: Secondary | ICD-10-CM | POA: Diagnosis present

## 2016-03-23 DIAGNOSIS — J9602 Acute respiratory failure with hypercapnia: Secondary | ICD-10-CM | POA: Diagnosis present

## 2016-03-23 DIAGNOSIS — J9601 Acute respiratory failure with hypoxia: Secondary | ICD-10-CM

## 2016-03-23 DIAGNOSIS — K439 Ventral hernia without obstruction or gangrene: Secondary | ICD-10-CM | POA: Diagnosis present

## 2016-03-23 DIAGNOSIS — L89619 Pressure ulcer of right heel, unspecified stage: Secondary | ICD-10-CM | POA: Diagnosis present

## 2016-03-23 DIAGNOSIS — E119 Type 2 diabetes mellitus without complications: Secondary | ICD-10-CM

## 2016-03-23 DIAGNOSIS — Z86711 Personal history of pulmonary embolism: Secondary | ICD-10-CM

## 2016-03-23 DIAGNOSIS — M1990 Unspecified osteoarthritis, unspecified site: Secondary | ICD-10-CM | POA: Diagnosis present

## 2016-03-23 DIAGNOSIS — E785 Hyperlipidemia, unspecified: Secondary | ICD-10-CM | POA: Diagnosis present

## 2016-03-23 DIAGNOSIS — K567 Ileus, unspecified: Secondary | ICD-10-CM | POA: Diagnosis present

## 2016-03-23 DIAGNOSIS — E876 Hypokalemia: Secondary | ICD-10-CM | POA: Diagnosis present

## 2016-03-23 HISTORY — DX: Other general symptoms and signs: R68.89

## 2016-03-23 LAB — COMPREHENSIVE METABOLIC PANEL
ALBUMIN: 2.7 g/dL — AB (ref 3.5–5.0)
ALT: 21 U/L (ref 14–54)
AST: 42 U/L — AB (ref 15–41)
Alkaline Phosphatase: 94 U/L (ref 38–126)
Anion gap: 13 (ref 5–15)
BUN: 29 mg/dL — AB (ref 6–20)
CHLORIDE: 110 mmol/L (ref 101–111)
CO2: 23 mmol/L (ref 22–32)
Calcium: 9.4 mg/dL (ref 8.9–10.3)
Creatinine, Ser: 0.66 mg/dL (ref 0.44–1.00)
GFR calc Af Amer: >60 mL/min (ref >60)
GFR calc non Af Amer: >60 mL/min (ref >60)
GLUCOSE: 153 mg/dL — AB (ref 65–99)
Potassium: 2.8 mmol/L — ABNORMAL LOW (ref 3.5–5.1)
SODIUM: 146 mmol/L — AB (ref 135–145)
Total Bilirubin: 0.3 mg/dL (ref 0.3–1.2)
Total Protein: 6.6 g/dL (ref 6.5–8.1)

## 2016-03-23 LAB — CBC WITH DIFFERENTIAL/PLATELET
Basophils Absolute: 0 10*3/uL (ref 0.0–0.1)
Basophils Relative: 0 %
EOS PCT: 0 %
Eosinophils Absolute: 0 10*3/uL (ref 0.0–0.7)
HEMATOCRIT: 40 % (ref 36.0–46.0)
Hemoglobin: 12.4 g/dL (ref 12.0–15.0)
LYMPHS ABS: 2.3 10*3/uL (ref 0.7–4.0)
Lymphocytes Relative: 24 %
MCH: 30.5 pg (ref 26.0–34.0)
MCHC: 31 g/dL (ref 30.0–36.0)
MCV: 98.3 fL (ref 78.0–100.0)
MONO ABS: 0.3 10*3/uL (ref 0.1–1.0)
Monocytes Relative: 3 %
NEUTROS ABS: 7.2 10*3/uL (ref 1.7–7.7)
Neutrophils Relative %: 73 %
PLATELETS: 340 10*3/uL (ref 150–400)
RBC: 4.07 MIL/uL (ref 3.87–5.11)
RDW: 14.7 % (ref 11.5–15.5)
WBC: 9.9 10*3/uL (ref 4.0–10.5)

## 2016-03-23 LAB — URINALYSIS, ROUTINE W REFLEX MICROSCOPIC
BACTERIA UA: NONE SEEN
BILIRUBIN URINE: NEGATIVE
Glucose, UA: NEGATIVE mg/dL
HGB URINE DIPSTICK: NEGATIVE
Ketones, ur: NEGATIVE mg/dL
Leukocytes, UA: NEGATIVE
Nitrite: NEGATIVE
PROTEIN: 100 mg/dL — AB
SPECIFIC GRAVITY, URINE: 1.021 (ref 1.005–1.030)
pH: 5 (ref 5.0–8.0)

## 2016-03-23 LAB — I-STAT ARTERIAL BLOOD GAS, ED
ACID-BASE DEFICIT: 6 mmol/L — AB (ref 0.0–2.0)
ACID-BASE DEFICIT: 4 mmol/L — AB (ref 0.0–2.0)
BICARBONATE: 23.2 mmol/L (ref 20.0–28.0)
Bicarbonate: 22.6 mmol/L (ref 20.0–28.0)
O2 SAT: 95 %
O2 SAT: 100 %
PCO2 ART: 62.1 mmHg — AB (ref 32.0–48.0)
Patient temperature: 99.2
TCO2: 25 mmol/L (ref 0–100)
TCO2: 24 mmol/L (ref 0–100)
pCO2 arterial: 49.7 mmHg — ABNORMAL HIGH (ref 32.0–48.0)
pH, Arterial: 7.182 — CL (ref 7.350–7.450)
pH, Arterial: 7.267 — ABNORMAL LOW (ref 7.350–7.450)
pO2, Arterial: 96 mmHg (ref 83.0–108.0)
pO2, Arterial: 208 mmHg — ABNORMAL HIGH (ref 83.0–108.0)

## 2016-03-23 LAB — I-STAT TROPONIN, ED: Troponin i, poc: 0.01 ng/mL (ref 0.00–0.08)

## 2016-03-23 LAB — MAGNESIUM: MAGNESIUM: 1.9 mg/dL (ref 1.7–2.4)

## 2016-03-23 LAB — I-STAT CG4 LACTIC ACID, ED
LACTIC ACID, VENOUS: 3.32 mmol/L — AB (ref 0.5–1.9)
Lactic Acid, Venous: 3.08 mmol/L (ref 0.5–1.9)

## 2016-03-23 LAB — PROTIME-INR
INR: 0.94
Prothrombin Time: 12.5 seconds (ref 11.4–15.2)

## 2016-03-23 LAB — LACTIC ACID, PLASMA: LACTIC ACID, VENOUS: 3 mmol/L — AB (ref 0.5–1.9)

## 2016-03-23 LAB — TROPONIN I: TROPONIN I: 0.03 ng/mL — AB (ref <0.03)

## 2016-03-23 LAB — BRAIN NATRIURETIC PEPTIDE: B NATRIURETIC PEPTIDE 5: 190.9 pg/mL — AB (ref 0.0–100.0)

## 2016-03-23 MED ORDER — VANCOMYCIN HCL 10 G IV SOLR
1500.0000 mg | Freq: Once | INTRAVENOUS | Status: AC
  Administered 2016-03-23: 1500 mg via INTRAVENOUS
  Filled 2016-03-23: qty 1500

## 2016-03-23 MED ORDER — SODIUM CHLORIDE 0.9 % IV BOLUS (SEPSIS)
1000.0000 mL | Freq: Once | INTRAVENOUS | Status: AC
  Administered 2016-03-23: 1000 mL via INTRAVENOUS

## 2016-03-23 MED ORDER — SODIUM CHLORIDE 0.9% FLUSH
3.0000 mL | Freq: Two times a day (BID) | INTRAVENOUS | Status: DC
  Administered 2016-03-23 – 2016-03-29 (×11): 3 mL via INTRAVENOUS

## 2016-03-23 MED ORDER — IPRATROPIUM-ALBUTEROL 0.5-2.5 (3) MG/3ML IN SOLN
3.0000 mL | RESPIRATORY_TRACT | Status: DC
  Administered 2016-03-23 – 2016-03-24 (×6): 3 mL via RESPIRATORY_TRACT
  Filled 2016-03-23 (×5): qty 3

## 2016-03-23 MED ORDER — IOPAMIDOL (ISOVUE-370) INJECTION 76%
INTRAVENOUS | Status: AC
  Administered 2016-03-23: 100 mL
  Filled 2016-03-23: qty 100

## 2016-03-23 MED ORDER — SODIUM CHLORIDE 0.9 % IV SOLN
30.0000 meq | Freq: Once | INTRAVENOUS | Status: AC
  Administered 2016-03-23: 30 meq via INTRAVENOUS
  Filled 2016-03-23: qty 15

## 2016-03-23 MED ORDER — DEXTROSE 5 % IV SOLN
2.0000 g | Freq: Three times a day (TID) | INTRAVENOUS | Status: DC
  Administered 2016-03-23: 2 g via INTRAVENOUS
  Filled 2016-03-23: qty 2

## 2016-03-23 MED ORDER — SODIUM CHLORIDE 0.9 % IV SOLN
INTRAVENOUS | Status: AC
  Administered 2016-03-23 – 2016-03-24 (×2): via INTRAVENOUS

## 2016-03-23 MED ORDER — RIVAROXABAN 20 MG PO TABS
20.0000 mg | ORAL_TABLET | Freq: Every day | ORAL | Status: DC
  Administered 2016-03-25 – 2016-03-28 (×4): 20 mg via ORAL
  Filled 2016-03-23 (×6): qty 1

## 2016-03-23 MED ORDER — CARVEDILOL 3.125 MG PO TABS
3.1250 mg | ORAL_TABLET | Freq: Two times a day (BID) | ORAL | Status: DC
  Administered 2016-03-24 – 2016-03-28 (×8): 3.125 mg via ORAL
  Filled 2016-03-23 (×11): qty 1

## 2016-03-23 MED ORDER — IPRATROPIUM BROMIDE 0.02 % IN SOLN
0.5000 mg | Freq: Once | RESPIRATORY_TRACT | Status: AC
  Administered 2016-03-23: 0.5 mg via RESPIRATORY_TRACT
  Filled 2016-03-23: qty 2.5

## 2016-03-23 MED ORDER — VANCOMYCIN HCL IN DEXTROSE 750-5 MG/150ML-% IV SOLN
750.0000 mg | Freq: Two times a day (BID) | INTRAVENOUS | Status: DC
  Filled 2016-03-23: qty 150

## 2016-03-23 MED ORDER — SODIUM CHLORIDE 0.9 % IV BOLUS (SEPSIS): 500.0000 mL | Freq: Once | INTRAVENOUS | Status: DC

## 2016-03-23 MED ORDER — PIPERACILLIN-TAZOBACTAM 3.375 G IVPB
3.3750 g | Freq: Three times a day (TID) | INTRAVENOUS | Status: DC
  Administered 2016-03-23 – 2016-03-25 (×4): 3.375 g via INTRAVENOUS
  Filled 2016-03-23 (×6): qty 50

## 2016-03-23 MED ORDER — ALBUTEROL (5 MG/ML) CONTINUOUS INHALATION SOLN
10.0000 mg/h | INHALATION_SOLUTION | RESPIRATORY_TRACT | Status: DC
Start: 2016-03-23 — End: 2016-03-27
  Administered 2016-03-23: 10 mg/h via RESPIRATORY_TRACT
  Filled 2016-03-23: qty 20

## 2016-03-23 MED ORDER — SODIUM CHLORIDE 0.9 % IV BOLUS (SEPSIS)
500.0000 mL | Freq: Once | INTRAVENOUS | Status: AC
  Administered 2016-03-23: 500 mL via INTRAVENOUS

## 2016-03-23 NOTE — ED Notes (Signed)
Intensivist at bedside.  Per MD will admit to ICU, leaving on BiPAP until admitted

## 2016-03-23 NOTE — ED Notes (Signed)
Respiratory at bedside.

## 2016-03-23 NOTE — Progress Notes (Signed)
Discussed patient with CCM. Patient is stable for SDU admission. Will plan for NG tube placement once in SDU. Will keep on BIPAP until NG tube in place. Suspect abdominal pain and ileus kept patient from taking deep breaths, resulting in respiratory acidosis. Hopeful once abdominal symptoms are improved, patient will be able to wean off of BIPAP. CCM following closely.   Spoke with patient's son. States he has spoken with the patient about Code status and she has confirmed multiple times that she would like everything. Stresses that she would like to be intubated short term if needed, but would not like prolonged intubation. Would like to be full code status despite age and comorbidities.  Also states that he is unsure how his mother will tolerate an NG tube. Reports he would like us to try and place the NG tube if that is what is best for his mother, however she may be distressed and not understand why it is being placed.  Son would like to be updated on any changes to plan or status.   Dr. Caroleen Hammanumley 03/23/16, 11:31 PM

## 2016-03-23 NOTE — Progress Notes (Signed)
[facility] Acute Visit  [name] is alone Sources of clinical information for visit is/are patient, relative(I spoke with patient's son, Beatris SiBobbie Sexton by phone [phone] ([phone])) and nursing home.  HISTORY OF PRESENT ILLNESS: Lynn KudoMina S Budlong is a 76 y.o.  female.    No chief complaint on file.   HPI:   Asked to see patient by nursing becaue of increased work of breathing.   - Duration: 4 days - Management: paortable CXR on 12/31 without acute infiltrate - Severity: interfering with eating and speaking - Associated Symptoms: cough, "I am afraid I am going to die", shortness of breath -     History Patient Active Problem List   Diagnosis Date Noted  . Hx of Multifocal atrial tachycardia (HCC) 01/12/2016  . Pulmonary embolism with acute cor pulmonale (HCC) 01/11/2016  . Pressure injury of skin 01/11/2016  . Prediabetes 01/11/2016  . Thoracic aortic aneurysm (HCC) 01/11/2016  . 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 Pulmonary nodules/lesions, multiple   . Anxiety state 09/30/2015  . Weight gain 08/24/2015  . Hx of hypercalcemia 05/18/2015  . Osteoarthritis 03/11/2015  . Anemia 03/11/2015  . 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 Person living in residential institution 12/03/2014  . Diabetes mellitus without complication (HCC) 11/29/2014  . Hyperlipemia   . Essential hypertension   . Fall   . Solitary pulmonary nodule, possible 08/29/2014     Medications No current facility-administered medications for this visit.   Current Outpatient Prescriptions:  .  acetaminophen (TYLENOL) 650 MG CR tablet, Take 650-1,300 mg by mouth See admin instructions. Take 650 mg at 10 AM. Take 1300 mg at 6PM. Take 1300 mg at 2AM., Disp: , Rfl:  .  albuterol (PROVENTIL HFA;VENTOLIN HFA) 108 (90 BASE) MCG/ACT inhaler, Inhale 2 puffs into the lungs every 6 (six) hours as needed for wheezing or shortness of breath., Disp: 1 Inhaler, Rfl: 0 .  ALPRAZolam (XANAX) 0.25 MG tablet, Take 0.25 mg  by mouth every 8 (eight) hours as needed for anxiety., Disp: , Rfl:  .  bisacodyl (DULCOLAX) 10 MG suppository, Place 10 mg rectally as needed for moderate constipation., Disp: , Rfl:  .  calcium citrate (CALCITRATE - DOSED IN MG ELEMENTAL CALCIUM) 950 MG tablet, Take 200 mg of elemental calcium by mouth daily., Disp: , Rfl:  .  carvedilol (COREG) 3.125 MG tablet, Take 1 tablet (3.125 mg total) by mouth 2 (two) times daily with a meal., Disp: 60 tablet, Rfl: 0 .  Cholecalciferol 2000 units CAPS, Take 1 capsule (2,000 Units total) by mouth daily., Disp: 30 each, Rfl: 0 .  guaiFENesin (ROBITUSSIN) 100 MG/5ML SOLN, Take 10 mLs by mouth every 4 (four) hours as needed for cough or to loosen phlegm., Disp: , Rfl:  .  magnesium hydroxide (MILK OF MAGNESIA) 400 MG/5ML suspension, Take 30 mLs by mouth daily as needed for mild constipation., Disp: , Rfl:  .  nitroGLYCERIN (NITROSTAT) 0.4 MG SL tablet, Place 0.4 mg under the tongue every 5 (five) minutes as needed for chest pain., Disp: , Rfl:  .  rivaroxaban (XARELTO) 20 MG TABS tablet, Take 1 tablet (20 mg total) by mouth daily with supper., Disp: 71 tablet, Rfl: 0 .  Sodium Phosphates (RA SALINE ENEMA) 19-7 GM/118ML ENEM, Place 1 each rectally as needed (for constipation)., Disp: , Rfl:   Facility-Administered Medications Ordered in Other Visits:  .  albuterol (PROVENTIL,VENTOLIN) solution continuous neb, 10 mg/hr, Nebulization, Continuous, Alvira MondayErin Schlossman, MD, Last Rate: 2 mL/hr at 03/23/16  1433, 10 mg/hr at 03/23/16 1433   Vitals:   03/23/16 1433  BP: 110/65  Pulse: (!) 110  Resp: (!) 24  Temp: 99.2 F (37.3 C)  SpO2: 92%    Wt Readings from Last 3 Encounters:  01/12/16 179 lb 14.3 oz (81.6 kg)  08/24/15 180 lb (81.6 kg)  11/29/14 147 lb 14.9 oz (67.1 kg)     Review of Systems: Gastrointestinal: Denies abd pain Genitourinary: Denies dysuria      PHYSICAL EXAM:. General: proptosis, slow to respond, HOH, blank stare though will  briefly focus on face, very fatigued appearing  Neck:  (+) JVD, no lymphadenopathy CV:  Tachy, reg reg, hyperdynamic heart sounds, no murmur nor gallup RESP: Bilateral cracles to apexes,  Monophonic wheezing best heard over neck,, tachypnea, speaking in short dsentences ABD:  Soft, (+) BS, Nondistended   I discussed my findings and concerns with patient's son, Mr Henderson Newcomer, by phone.  He was in agreement with transfer to the ED. I notified Dr A. Diallo about the possibility of admission of patient to [facility] Medicine Teaching Service after ED evaluation.    Assessment(s):   1. Acute Respiratory Distress with Hypoxemia - New oxygen requirement - Bilateral diffuse pulmonary crackles - 02/2015 TTE ef 60 to 65%, unable assess diastolic function - Pulmonary Artery enlargement with Pulm Emboli 12/2015.    2.  Left lateral heel pressure ulcer with eschar, unstageable 3.  Right lateral forefoot pressure ulcer with eschar, unstageable 4.  Frail phenotype 5. 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 At-risk of delirium   Plan(s): Transfer to ED for quick evaluation and treatment, along with triage to intensity of level of care.  If patient deemed to need admission or observation, the [facility] Medicine Teaching Service will admit Ms Verret.    Code Status:     Patient remains a full code./ T. McDairmid, MD 03/24/15. Code Status History    Date Active Date Inactive Code Status Order ID Comments User Context   01/11/2016  9:11 AM 01/13/2016  7:03 PM Full Code [id number]  Raliegh Ip, DO Inpatient   12/01/2014  9:56 AM 12/02/2014  6:20 PM Full Code [id number]  Elease Etienne, MD Inpatient   11/30/2014  8:45 AM 12/01/2014  9:56 AM DNR [id number]  Elease Etienne, MD Inpatient   11/29/2014  3:00 AM 11/30/2014  8:45 AM Full Code [id number]  Lorretta Harp, MD ED       30 minutes face to face where spent in total with counseling / coordination of care took more than 50% of the total time. Counseling involved  discussion of case with patient's son, nursing home staff, and [facility].

## 2016-03-23 NOTE — ED Notes (Signed)
Pt transported to CT ?

## 2016-03-23 NOTE — ED Provider Notes (Signed)
[facility]-EMERGENCY DEPT Provider Note   CSN: [id number] Arrival date & time: 03/23/16  1401     History   Chief Complaint Chief Complaint  Patient presents with  . Shortness of Breath    HPI [name] is a 76 y.o. female.  HPI  Level V Caveat, mental status change/respiratory distress  Presents with concern of dyspnea and cough. Son reports that she has had waxing and waning mental status since her hospitalization for pneumonia in October, will sometimes seem with it however sometimes will be disoriented at baseline. Reports that over the last 3 days, she's developed cough, wheezing, and has had temperatures up to 100.6 at her facility. Per EMS and son, patient significantly worsened last night, and symptoms were worse this morning. She is on 2 L of oxygen when they went to pick her up from the facility. She is not typically on O2. Patient with significant respiratory distress per EMS on their arrival, unable to speak, they gave her 5 mg of albuterol and 0.5 mg of Atrovent, and reports that her symptoms seemed to have improved since their arrival. They also gave her 125 of Solu-Medrol.  Past Medical History:  Diagnosis Date  . Bone spur of left foot   . Depression   . Diabetes mellitus   . Diabetes mellitus without complication (HCC) 11/29/2014  . Essential hypertension   . Fall   . 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 Gout   . Hemorrhoid   . Hypercalcemia 05/18/2015  . Hyperlipemia   . Hypertension   . Iron deficiency anemia secondary to blood loss (chronic)   . 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 Obesity   . Osteoarthritis   . Person living in residential institution 12/03/2014  . Prediabetes 01/11/2016  . 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 Pulmonary nodules/lesions, multiple   . Solitary pulmonary nodule 01/07/2015  . Thoracic aortic aneurysm (HCC) 01/11/2016  . UTI (lower urinary tract  infection) 11/29/2014    Patient Active Problem List   Diagnosis Date Noted  . Influenza-like symptoms 03/23/2016  . Positive blood culture 01/15/2016  . Acute cystitis without hematuria   . 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 Hypoxia 01/11/2016  . Pulmonary embolism with acute cor pulmonale (HCC) 01/11/2016  . Pressure injury of skin 01/11/2016  . Prediabetes 01/11/2016  . Thoracic aortic aneurysm (HCC) 01/11/2016  . 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 Pulmonary nodules/lesions, multiple   . Anxiety state 09/30/2015  . Weight gain 08/24/2015  . Pruritic intertrigo 07/24/2015  . Hypercalcemia 05/18/2015  . Osteoarthritis 03/11/2015  . Anemia 03/11/2015  . Bone spur of left foot   . 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 Person living in residential institution 12/03/2014  . Diabetes mellitus without complication (HCC) 11/29/2014  . Acute delirium 11/29/2014  . Hyperlipemia   . Essential hypertension   . Fall   . Solitary pulmonary nodule, possible 08/29/2014    Past Surgical History:  Procedure Laterality Date  . KNEE SURGERY Right     OB History    No data available       Home Medications    Prior to Admission medications   Medication Sig Start Date End Date Taking? Authorizing Provider  acetaminophen (TYLENOL) 650 MG CR tablet Take 650-1,300 mg by mouth See admin instructions. Take 650 mg at 10 AM. Take 1300 mg at 6PM. Take 1300 mg at 2AM.   Yes Historical Provider,  MD  albuterol (ACCUNEB) 0.63 MG/3ML nebulizer solution Take 1 ampule by nebulization every 4 (four) hours as needed for wheezing.   Yes Historical Provider, MD  albuterol (PROVENTIL HFA;VENTOLIN HFA) 108 (90 BASE) MCG/ACT inhaler Inhale 2 puffs into the lungs every 6 (six) hours as needed for wheezing or shortness of breath. 02/20/15  Yes Nani Ravens, MD  bisacodyl (DULCOLAX) 10 MG suppository Place 10 mg rectally as needed for moderate constipation.   Yes Historical Provider, MD    calcium citrate (CALCITRATE - DOSED IN MG ELEMENTAL CALCIUM) 950 MG tablet Take 200 mg of elemental calcium by mouth daily.   Yes Historical Provider, MD  carvedilol (COREG) 3.125 MG tablet Take 1 tablet (3.125 mg total) by mouth 2 (two) times daily with a meal. Patient taking differently: Take 3.125 mg by mouth 2 (two) times daily with a meal. Hold if SBP<140 and HR <50 01/13/16  Yes Freddrick March, MD  Cholecalciferol 2000 units CAPS Take 1 capsule (2,000 Units total) by mouth daily. 05/13/15  Yes Jamal Collin, MD  guaiFENesin (ROBITUSSIN) 100 MG/5ML SOLN Take 10 mLs by mouth every 4 (four) hours as needed for cough or to loosen phlegm.   Yes Historical Provider, MD  magnesium hydroxide (MILK OF MAGNESIA) 400 MG/5ML suspension Take 30 mLs by mouth daily as needed for mild constipation.   Yes Historical Provider, MD  Menthol, Topical Analgesic, (BIOFREEZE) 4 % GEL Apply 1 application topically 4 (four) times daily as needed (pain).   Yes Historical Provider, MD  nitroGLYCERIN (NITROSTAT) 0.4 MG SL tablet Place 0.4 mg under the tongue every 5 (five) minutes as needed for chest pain.   Yes Historical Provider, MD  rivaroxaban (XARELTO) 20 MG TABS tablet Take 1 tablet (20 mg total) by mouth daily with supper. 02/01/16 04/12/16 Yes Freddrick March, MD  Sodium Phosphates (RA SALINE ENEMA) 19-7 GM/118ML ENEM Place 1 each rectally as needed (for constipation).   Yes Historical Provider, MD    Family History Family History  Problem Relation Age of Onset  . Heart attack Mother   . Diabetes Father     Social History Social History  Substance Use Topics  . Smoking status: Former Games developer  . Smokeless tobacco: Not on file  . Alcohol use Not on file     Allergies   Aspirin; Latex; and Penicillins   Review of Systems Review of Systems  Unable to perform ROS: Mental status change  Constitutional: Positive for fever.  Respiratory: Positive for cough, shortness of breath and wheezing.       Physical Exam Updated Vital Signs BP 122/73   Pulse 97   Temp (S) 98.4 F (36.9 C) (Rectal)   Resp 24   Wt 180 lb 12.4 oz (82 kg)   SpO2 100%   BMI 35.31 kg/m   Physical Exam  Constitutional: She appears well-developed and well-nourished. No distress.  HENT:  Head: Normocephalic and atraumatic.  Eyes: Conjunctivae and EOM are normal.  Neck: Normal range of motion.  Cardiovascular: Regular rhythm, normal heart sounds and intact distal pulses.  Exam reveals no gallop and no friction rub.   No murmur heard. Pulmonary/Chest: Effort normal. Tachypnea noted. No respiratory distress. She has wheezes. She has rhonchi. She has no rales.  Abdominal: Soft. She exhibits no distension. There is no tenderness. There is no guarding.  Musculoskeletal: She exhibits no edema or tenderness.  Neurological: She is alert.  Oriented to self Unable to state location or date, responds with "I would  like to fill out a questionnairre"    Skin: Skin is warm and dry. No rash noted. She is not diaphoretic. No erythema.  Nursing note and vitals reviewed.    ED Treatments / Results  Labs (all labs ordered are listed, but only abnormal results are displayed) Labs Reviewed  COMPREHENSIVE METABOLIC PANEL - Abnormal; Notable for the following:       Result Value   Sodium 146 (*)    Potassium 2.8 (*)    Glucose, Bld 153 (*)    BUN 29 (*)    Albumin 2.7 (*)    AST 42 (*)    All other components within normal limits  URINALYSIS, ROUTINE W REFLEX MICROSCOPIC - Abnormal; Notable for the following:    APPearance HAZY (*)    Protein, ur 100 (*)    Squamous Epithelial / LPF 0-5 (*)    All other components within normal limits  BRAIN NATRIURETIC PEPTIDE - Abnormal; Notable for the following:    B Natriuretic Peptide 190.9 (*)    All other components within normal limits  TROPONIN I - Abnormal; Notable for the following:    Troponin I 0.03 (*)    All other components within normal limits  LACTIC  ACID, PLASMA - Abnormal; Notable for the following:    Lactic Acid, Venous 3.0 (*)    All other components within normal limits  I-STAT CG4 LACTIC ACID, ED - Abnormal; Notable for the following:    Lactic Acid, Venous 3.08 (*)    All other components within normal limits  I-STAT CG4 LACTIC ACID, ED - Abnormal; Notable for the following:    Lactic Acid, Venous 3.32 (*)    All other components within normal limits  I-STAT ARTERIAL BLOOD GAS, ED - Abnormal; Notable for the following:    pH, Arterial 7.182 (*)    pCO2 arterial 62.1 (*)    Acid-base deficit 6.0 (*)    All other components within normal limits  CULTURE, BLOOD (ROUTINE X 2)  CULTURE, BLOOD (ROUTINE X 2)  URINE CULTURE  C DIFFICILE QUICK SCREEN W PCR REFLEX  GASTROINTESTINAL PANEL BY PCR, STOOL (REPLACES STOOL CULTURE)  CBC WITH DIFFERENTIAL/PLATELET  MAGNESIUM  PROTIME-INR  INFLUENZA PANEL BY PCR (TYPE A & B, H1N1)  BLOOD GAS, ARTERIAL  COMPREHENSIVE METABOLIC PANEL  CBC  TROPONIN I  TROPONIN I  LACTIC ACID, PLASMA  I-STAT TROPOININ, ED    EKG  EKG Interpretation None       Radiology Abd 1 View (kub)  Result Date: 03/23/2016 CLINICAL DATA:  Diarrhea.  Altered mental status. EXAM: ABDOMEN - 1 VIEW COMPARISON:  None. FINDINGS: There is mild gaseous distention of small and large bowel. Contrast in the urinary bladder from chest CT earlier today is noted. Atherosclerotic vascular disease is seen. IMPRESSION: Bowel gas pattern most in keeping with ileus. Electronically Signed   By: Drusilla Kanner M.D.   On: 03/23/2016 20:39   Ct Angio Chest Pe W And/or Wo Contrast  Result Date: 03/23/2016 CLINICAL DATA:  Shortness of breath beginning last night, worse today. EXAM: CT ANGIOGRAPHY CHEST WITH CONTRAST TECHNIQUE: Multidetector CT imaging of the chest was performed using the standard protocol during bolus administration of intravenous contrast. Multiplanar CT image reconstructions and MIPs were obtained to evaluate the  vascular anatomy. CONTRAST:  100 cc Isovue 370. COMPARISON:  CT chest 01/11/2016. FINDINGS: Cardiovascular: Previously seen pulmonary emboli are no longer visualized. There is cardiomegaly. Enlarged pulmonary outflow tract consistent with pulmonary arterial hypertension is  again seen. 4.1 cm in diameter ascending thoracic aorta is also again seen. Mediastinum/Nodes: No enlarged mediastinal, hilar, or axillary lymph nodes. Thyroid gland, trachea, and esophagus demonstrate no significant findings. Lungs/Pleura: No pleural effusion. Scattered bilateral subcentimeter pulmonary nodules are again seen. Some of these measure slightly smaller including a right middle lobe nodule on image 85 measuring 0.6 cm in diameter compared to 0.8 on the prior exam. Nodule in the lingula which had measured 0.6 cm measures 0.5 cm on image 86. No new or enlarging nodules identified. Mild scattered ground-glass attenuation bilaterally is seen. Upper Abdomen: Calcifications in the spleen and liver are consistent with old granulomatous disease. Musculoskeletal: Osteopenia multilevel thoracic spondylosis are noted. No lytic or sclerotic lesion. Review of the MIP images confirms the above findings. IMPRESSION: Negative for pulmonary embolus or acute disease. Emboli seen on the comparison examination are normal no longer visualized. Cardiomegaly. Findings compatible pulmonary trunk hypertension. 4.1 cm ascending thoracic aorta, unchanged. Recommend annual imaging followup by CTA or MRA. This recommendation follows 2010 ACCF/AHA/AATS/ACR/ASA/SCA/SCAI/SIR/STS/SVM Guidelines for the Diagnosis and Management of Patients with Thoracic Aortic Disease. Circulation. 2010; 121: W098-J191. No change to slight decrease in the size of multiple bilateral pulmonary nodules. No new or enlarging nodule is identified. Electronically Signed   By: Drusilla Kanner M.D.   On: 03/23/2016 18:14   Dg Chest Portable 1 View  Result Date: 03/23/2016 CLINICAL DATA:   Shortness of breath.  History of pneumonia. EXAM: PORTABLE CHEST 1 VIEW COMPARISON:  CT chest and single view of the chest 01/11/2016. FINDINGS: There is cardiomegaly without edema. No pneumothorax or pleural effusion. Calcified granuloma in the right mid lung is again seen. No bony abnormality. Aortic atherosclerosis is noted. IMPRESSION: No acute disease. Cardiomegaly and atherosclerosis. Electronically Signed   By: Drusilla Kanner M.D.   On: 03/23/2016 15:33    Procedures Procedures (including critical care time)  Medications Ordered in ED Medications  albuterol (PROVENTIL,VENTOLIN) solution continuous neb (0 mg/hr Nebulization Stopped 03/23/16 1545)  carvedilol (COREG) tablet 3.125 mg (not administered)  rivaroxaban (XARELTO) tablet 20 mg (not administered)  sodium chloride flush (NS) 0.9 % injection 3 mL (not administered)  0.9 %  sodium chloride infusion ( Intravenous New Bag/Given 03/23/16 2135)  ipratropium-albuterol (DUONEB) 0.5-2.5 (3) MG/3ML nebulizer solution 3 mL (3 mLs Nebulization Given 03/23/16 2131)  piperacillin-tazobactam (ZOSYN) IVPB 3.375 g (3.375 g Intravenous New Bag/Given 03/23/16 2132)  ipratropium (ATROVENT) nebulizer solution 0.5 mg (0.5 mg Nebulization Given 03/23/16 1503)  sodium chloride 0.9 % bolus 1,000 mL (0 mLs Intravenous Stopped 03/23/16 2138)  sodium chloride 0.9 % bolus 1,000 mL (0 mLs Intravenous Stopped 03/23/16 1730)  potassium chloride 30 mEq in sodium chloride 0.9 % 265 mL (KCL MULTIRUN) IVPB (30 mEq Intravenous Given 03/23/16 1720)  vancomycin (VANCOCIN) 1,500 mg in sodium chloride 0.9 % 500 mL IVPB (0 mg Intravenous Stopped 03/23/16 2135)  iopamidol (ISOVUE-370) 76 % injection (100 mLs  Contrast Given 03/23/16 1729)  sodium chloride 0.9 % bolus 500 mL (500 mLs Intravenous New Bag/Given 03/23/16 2131)     Initial Impression / Assessment and Plan / ED Course  I have reviewed the triage vital signs and the nursing notes.  Pertinent labs & imaging results that were  available during my care of the patient were reviewed by me and considered in my medical decision making (see chart for details).  Clinical Course    A 76 year old female with a history of hypertension, pulmonary embolus on several toe, thoracic aortic aneurysm,  pulmonary artery hypertension, diabetes, hypertension, hyperlipidemia, admission for pneumonia in October who presents with concern for dyspnea, wheezing and cough from [facility] facility.  Patient tachypnea and wheezing on EMS arrival and was given a DuoNeb's and Solu-Medrol with some improvement prior to arrival to the emergency department.  On arrival to the emergency department, the patient is tachypneic and wheezing. Lactic acid elevated to 3, ordered 30cc/kg fluid and ordered vanc/cefepime for suspected HCAP.  Patient without history of COPD, however has had wheezing documented on prior exams, and some improvement with EMS with bronchodilators, and continuous albuterol and ipratropium were ordered.  Chest x-ray without signs of pneumonia or pulmonary edema. CBC shows no leukocytosis. Would consider lactic acid may be secondary to dehydration or other.  Given cough and wheezing, influenza was ordered.  CT PE study ordered given worsening hypoxia and tachypnea. Pt with saturations 88% on RA. Other labs significant for hypokalemia and hypernatremia. Given K replacement.  Pt also developed diarrhea in ED, CDiff ordered. Admitted to family practice with pending CT PE study. Pt is full code per son.    Final Clinical Impressions(s) / ED Diagnoses   Final diagnoses:  SOB (shortness of breath)  Diarrhea  Acute respiratory failure with hypoxia and hypercapnia (HCC)    New Prescriptions New Prescriptions   No medications on file     Alvira MondayErin Barnaby Rippeon, MD 03/23/16 2213

## 2016-03-23 NOTE — ED Triage Notes (Signed)
Per EMS - pt from [facility]. Recently admitted for pneumonia. Pt began experiencing shortness of breath last night, worse today. Wheezing in all lung fields. Given 5mg  albuterol, 0.5mg  atrovent, 125mg  solumedrol. Labored resp, unable to speak in full sentences upon initial assessment by EMS.

## 2016-03-23 NOTE — Consult Note (Signed)
PULMONARY / CRITICAL CARE MEDICINE   Name: [name] MRN: [id number] DOB: 09/10/1940    ADMISSION DATE:  03/23/2016 CONSULTATION DATE:  03/23/2016  REFERRING MD:  [facility]PT[facility] Dr. Pollie MeyerMcIntyre   CHIEF COMPLAINT:  SOB  HISTORY OF PRESENT ILLNESS:  Patient is encephalopathic and/or intubated. Therefore history has been obtained from chart review. 76 year old female with PMH as below, which is significant for dementia (oriented to self only at baseline), DM, HTN, MAT, PAH, and she is a SNF resident. [facility] with complaints of dyspnea x 4 days. Patient is not able to participate much in history taking. She was sent to ED for evaluation given her history of PE, currently treated with Xarelto. Also complaints of I large episode diarrhea. Workup in ED consisted of CXR and CT angiogram with no clear causes of dyspnea. There was some concern for sepsis due to elevated lactic acid so she was started on broad spectrum antibiotics. ABG revealed respiratory acidosis and she was started on BiPAP which did relieve this some within about an hour. KUB demonstrated ileus. She was admitted to the family practice service and PCCM has been asked to see for further evaluation.  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  PAST  SURGICAL HISTORY: She  has a past surgical history that includes Knee surgery (Right).  Allergies  Allergen Reactions  . Aspirin Other (See Comments)    Bleeding   . Latex Itching  . Penicillins Rash    Has patient had a PCN reaction causing immediate rash, facial/tongue/throat swelling, SOB or lightheadedness with hypotension: No Has patient had a PCN reaction causing severe rash involving mucus membranes or skin necrosis: No Has patient had a PCN reaction that required hospitalization No Has patient had a PCN reaction occurring within the last 10 years: No If all of the above answers are "NO", then may proceed with Cephalosporin use.     No current facility-administered medications on file prior to encounter.    Current Outpatient Prescriptions on File Prior to Encounter  Medication Sig  . acetaminophen (TYLENOL) 650 MG CR tablet Take 650-1,300 mg by mouth See admin instructions. Take 650 mg at 10 AM. Take 1300 mg at [facility]6PM. Take 1300 mg at 2AM.  . albuterol (PROVENTIL HFA;VENTOLIN HFA) 108 (90 BASE) MCG/ACT inhaler Inhale 2 puffs into the lungs every 6 (six) hours as needed for wheezing or shortness of breath.  . bisacodyl (DULCOLAX) 10 MG suppository Place 10 mg rectally as needed for moderate constipation.  . calcium citrate (CALCITRATE - DOSED IN MG ELEMENTAL CALCIUM) 950 MG tablet Take 200 mg of elemental calcium by mouth daily.  . carvedilol (COREG) 3.125 MG tablet Take 1 tablet (3.125 mg total) by mouth 2 (two) times daily with a meal. (Patient taking differently: Take 3.125 mg by mouth 2 (two) times daily with a meal. Hold if SBP<140 and HR <50)  . Cholecalciferol 2000 units CAPS Take 1 capsule (2,000 Units total) by mouth daily.  Marland Kitchen. guaiFENesin (ROBITUSSIN) 100  MG/5ML SOLN Take 10 mLs by mouth every 4 (four) hours as needed for cough or to loosen phlegm.  . magnesium hydroxide (MILK OF MAGNESIA) 400 MG/5ML suspension Take 30 mLs by mouth daily as needed for mild constipation.  .  nitroGLYCERIN (NITROSTAT) 0.4 MG SL tablet Place 0.4 mg under the tongue every 5 (five) minutes as needed for chest pain.  . rivaroxaban (XARELTO) 20 MG TABS tablet Take 1 tablet (20 mg total) by mouth daily with supper.  . Sodium Phosphates (RA SALINE ENEMA) 19-7 GM/118ML ENEM Place 1 each rectally as needed (for constipation).    FAMILY HISTORY:  Her indicated that the status of her mother is unknown. She indicated that the status of her father is unknown.    SOCIAL HISTORY: She  reports that she has quit smoking. She does not have any smokeless tobacco history on file.  REVIEW OF SYSTEMS:  Limited due to dementia Bolds are positive  Constitutional: weight loss, gain, night sweats, Fevers, chills, fatigue .  HEENT: headaches, Sore throat, sneezing, nasal congestion, post nasal drip, Difficulty swallowing, Tooth/dental problems, visual complaints visual changes, ear ache CV:  chest pain, radiates: ,Orthopnea, PND, swelling in lower extremities, dizziness, palpitations, syncope.  GI  heartburn, indigestion, abdominal pain, nausea, vomiting, diarrhea, change in bowel habits, loss of appetite, bloody stools.  Resp: cough, productive: , hemoptysis, dyspnea, chest pain, pleuritic.  Skin: rash or itching or icterus GU: dysuria, change in color of urine, urgency or frequency. flank pain, hematuria  MS: joint pain or swelling. decreased range of motion  Psych: change in mood or affect. depression or anxiety.  Neuro: difficulty with speech, weakness, numbness, ataxia    SUBJECTIVE:    VITAL SIGNS: BP 158/87   Pulse 101   Temp (S) 98.4 F (36.9 C) (Rectal)   Resp (!) 43   Wt 82 kg (180 lb 12.4 oz)   SpO2 99%   BMI 35.31 kg/m   HEMODYNAMICS:    VENTILATOR SETTINGS: FiO2 (%):  [50 %] 50 %  INTAKE / OUTPUT: I/O last 3 completed shifts: In: 1050 [IV Piggyback:1050] Out: -   PHYSICAL EXAMINATION: General:  Elderly female in NAD on BiPAP Neuro:  Spontaneously awake, alert.  Oriented to self and president only.  HEENT:  [place]/AT, PERRL, no JVD Cardiovascular:  RRR, no MRG Lungs:  Rhoncherous. Rapid shallow Abdomen:  Soft, mild distension, non-tender Musculoskeletal:  No acute deformity Skin: Grossly intact  LABS:  BMET  Recent Labs Lab 03/23/16 1447  NA 146*  K 2.8*  CL 110  CO2 23  BUN 29*  CREATININE 0.66  GLUCOSE 153*    Electrolytes  Recent Labs Lab 03/23/16 1447  CALCIUM 9.4  MG 1.9    CBC  Recent Labs Lab 03/23/16 1447  WBC 9.9  HGB 12.4  HCT 40.0  PLT 340    Coag's  Recent Labs Lab 03/23/16 1447  INR 0.94    Sepsis Markers  Recent Labs Lab 03/23/16 1506 03/23/16 1855 03/23/16 2117  LATICACIDVEN 3.08* 3.32* 3.0*    ABG  Recent Labs Lab 03/23/16 2104 03/23/16 2232  PHART 7.182* 7.267*  PCO2ART 62.1* 49.7*  PO2ART 96.0 208.0*    Liver Enzymes  Recent Labs Lab 03/23/16 1447  AST 42*  ALT 21  ALKPHOS 94  BILITOT 0.3  ALBUMIN 2.7*    Cardiac Enzymes  Recent Labs Lab 03/23/16 2117  TROPONINI 0.03*    Glucose No results for input(s): GLUCAP in the last 168 hours.  Imaging Abd  1 View (kub)  Result Date: 03/23/2016 CLINICAL DATA:  Diarrhea.  Altered mental status. EXAM: ABDOMEN - 1 VIEW COMPARISON:  None. FINDINGS: There is mild gaseous distention of small and large bowel. Contrast in the urinary bladder from chest CT earlier today is noted. Atherosclerotic vascular disease is seen. IMPRESSION: Bowel gas pattern most in keeping with ileus. Electronically Signed   By: Drusilla Kanner M.D.   On: 03/23/2016 20:39   Ct Angio Chest Pe W And/or Wo Contrast  Result Date: 03/23/2016 CLINICAL DATA:  Shortness of breath beginning last night, worse today. EXAM: CT ANGIOGRAPHY CHEST WITH CONTRAST TECHNIQUE: Multidetector CT imaging of the chest was performed using the standard protocol during bolus administration of intravenous contrast. Multiplanar CT image reconstructions and MIPs were obtained to  evaluate the vascular anatomy. CONTRAST:  100 cc Isovue 370. COMPARISON:  CT chest 01/11/2016. FINDINGS: Cardiovascular: Previously seen pulmonary emboli are no longer visualized. There is cardiomegaly. Enlarged pulmonary outflow tract consistent with pulmonary arterial hypertension is again seen. 4.1 cm in diameter ascending thoracic aorta is also again seen. Mediastinum/Nodes: No enlarged mediastinal, hilar, or axillary lymph nodes. Thyroid gland, trachea, and esophagus demonstrate no significant findings. Lungs/Pleura: No pleural effusion. Scattered bilateral subcentimeter pulmonary nodules are again seen. Some of these measure slightly smaller including a right middle lobe nodule on image 85 measuring 0.6 cm in diameter compared to 0.8 on the prior exam. Nodule in the lingula which had measured 0.6 cm measures 0.5 cm on image 86. No new or enlarging nodules identified. Mild scattered ground-glass attenuation bilaterally is seen. Upper Abdomen: Calcifications in the spleen and liver are consistent with old granulomatous disease. Musculoskeletal: Osteopenia multilevel thoracic spondylosis are noted. No lytic or sclerotic lesion. Review of the MIP images confirms the above findings. IMPRESSION: Negative for pulmonary embolus or acute disease. Emboli seen on the comparison examination are normal no longer visualized. Cardiomegaly. Findings compatible pulmonary trunk hypertension. 4.1 cm ascending thoracic aorta, unchanged. Recommend annual imaging followup by CTA or MRA. This recommendation follows 2010 ACCF/AHA/AATS/ACR/ASA/SCA/SCAI/SIR/STS/SVM Guidelines for the Diagnosis and Management of Patients with Thoracic Aortic Disease. Circulation. 2010; 121: Z610-R604. No change to slight decrease in the size of multiple bilateral pulmonary nodules. No new or enlarging nodule is identified. Electronically Signed   By: Drusilla Kanner M.D.   On: 03/23/2016 18:14   Dg Chest Portable 1 View  Result Date:  03/23/2016 CLINICAL DATA:  Shortness of breath.  History of pneumonia. EXAM: PORTABLE CHEST 1 VIEW COMPARISON:  CT chest and single view of the chest 01/11/2016. FINDINGS: There is cardiomegaly without edema. No pneumothorax or pleural effusion. Calcified granuloma in the right mid lung is again seen. No bony abnormality. Aortic atherosclerosis is noted. IMPRESSION: No acute disease. Cardiomegaly and atherosclerosis. Electronically Signed   By: Drusilla Kanner M.D.   On: 03/23/2016 15:33     STUDIES:  CTA chest 1/3 > Negative for pulmonary embolus or acute disease. Emboli seen on the comparison examination are normal no longer visualized. Cardiomegaly. Findings compatible pulmonary trunk hypertension. 4.1 cm ascending thoracic aorta, unchanged.  KUB 1/3 > Bowel gas pattern most in keeping with ileus.  CULTURES: BCx2 1/3 > Urine 1/3 > GI panel PCR 1/3 > Cdif 1/3 >  ANTIBIOTICS: Zosyn 1/3 > Vancomycin 1/3 >  SIGNIFICANT EVENTS:   LINES/TUBES:   DISCUSSION:   ASSESSMENT / PLAN:  Acute hypercarbic respiratory failure - etiology not entirely clear. She does have findings consistent with ileus on KUB so perhaps this is an  issue of hypoventilation in that setting. She is breathing rapidly, but very shallow and able to speak in full sentences when prompted. Tolerating BiPAP well and improving quickly. This therapy not ideal in the setting of an ileus. Hopefully with NGT decompression she will be able to come off entirely. Suspect lactic acid is secondary to work of breathing. - Continue BiPAP - NGT to wall suction - Would attempt to DC BiPAP after NGT placed and gauge mental status for need to re-start - Not a great candidate for intubation due to dementia and other chronic illnesses. Need goals of care conversation with son.  - Doubt infectious origin, but reasonable to continue broad ABX for now with low threshold to DC. Follow cultures/viral studies - Consider diuresis - Ensure lactic  clearing  Ileus/Diarrhea - NGT to suction as above - NPO  PAH - No edema noted on CXR/CT - consider diuresis - KVO  History of PE - resolved by CT today - continue home Xarelto  DM - per primary  Joneen Roach, AGACNP-BC [facility] Pulmonology/Critical Care Pager [phone] or [phone]  03/23/2016 11:58 PM

## 2016-03-23 NOTE — Progress Notes (Signed)
Pt is on NIV at this time tolerating it well. RN aware 

## 2016-03-23 NOTE — Progress Notes (Addendum)
Pharmacy Antibiotic Note  [name] is a 76 y.o. female admitted on 03/23/2016 with pneumonia.  Pharmacy has been consulted for vancomycin and cefepime dosing.  Noted history of penicillin allergy- patient has received cefepime in the past, and no indication serious penicillin allergy.  Plan: -vancomycin 1500mg  IV x1, then 750mg  IV q12h -cefepime 2g IV q8h -follow c/s, clinical progression, renal function, level PRN     Temp (24hrs), Avg:99.2 F (37.3 C), Min:99.2 F (37.3 C), Max:99.2 F (37.3 C)  No results for input(s): WBC, CREATININE, LATICACIDVEN, VANCOTROUGH, VANCOPEAK, VANCORANDOM, GENTTROUGH, GENTPEAK, GENTRANDOM, TOBRATROUGH, TOBRAPEAK, TOBRARND, AMIKACINPEAK, AMIKACINTROU, AMIKACIN in the last 168 hours.  CrCl cannot be calculated (Patient's most recent lab result is older than the maximum 21 days allowed.).    Allergies  Allergen Reactions  . Aspirin Other (See Comments)    Bleeding   . Latex Itching  . Penicillins Rash    Has patient had a PCN reaction causing immediate rash, facial/tongue/throat swelling, SOB or lightheadedness with hypotension: No Has patient had a PCN reaction causing severe rash involving mucus membranes or skin necrosis: No Has patient had a PCN reaction that required hospitalization No Has patient had a PCN reaction occurring within the last 10 years: No If all of the above answers are "NO", then may proceed with Cephalosporin use.     Antimicrobials this admission: Vancomycin 1/3 >>  Cefepime 1/3 >>   Dose adjustments this admission: n/a  Microbiology results: 1/3 BCx: sent 1/3 UCx: sent (cath)   Thank you for allowing pharmacy to be a part of this patient's care.  Rhyder Bratz D. Shynice Sigel, PharmD, BCPS Clinical Pharmacist Pager: [phone] 03/23/2016 3:09 PM   ADDENDUM Patient to stop cefepime and start Zosyn.  Plan: Zosyn 3.375g IV q8h (each dose over 4h)  Abeera Flannery D. Celena Lanius, PharmD, BCPS Clinical Pharmacist Pager:  [phone] 03/23/2016 8:18 PM

## 2016-03-23 NOTE — ED Notes (Signed)
RT aware CCM requesting repeat ABG

## 2016-03-23 NOTE — ED Notes (Signed)
Pt had episode loose, watery stool. This RN and Arlys JohnBrian, RN cleaned pt and changed all linens.

## 2016-03-23 NOTE — Progress Notes (Signed)
Critical ABG results given to RN.  

## 2016-03-23 NOTE — H&P (Signed)
[facility] History and Physical Service Pager: [phone]  Patient name: [name] Medical record number: [id number] Date of birth: 10/18/1940 Age: 76 y.o. Gender: female  Primary Care Provider: Rodrigo Ranrystal Dorsey, MD Consultants: None Code Status: Full  Chief Complaint: Shortness of Breath  Assessment and Plan: [name] is a 76 y.o. female presenting with shortness of breath. PMH is significant for multifocal atrial tachycardia, h/o PE (12/2015), Thoracic Aortic Aneurysm, Pulmonary Hypertension, Anxiety, diabetes, hypertension, hyperlipidemia.   # Acute Respiratory Distress: Concerning for Sepsis. Arrived from nursing home with increased work of breathing. Currently on 2L [place], baseline is Room Air. Weight stable, 180pounds here and noted to be 180pounds in 08/2015. BNP slightly elevated to 190.9, no prior for comparison. Troponin negative. Lactic Acid 3.32. WBC normal at 9.9. CXR without acute pathology, no signs of pleural edema. CTA chest without PE, thoracic aortic aneurysm stable. Meets SIRS criteria, qSOFA 2. Suspected pulmonary hypertension given evidence of pulmonary artery enlargement on CTA. History of PE in 12/2015, currently on Rivaroxaban. Bilateral Pulmonary Nodules noted, patient declining further imaging since she does not want aggressive treatments if it is cancer. Last Echocardiogram from 02/2015 with EF 60-65%, difficult study. Received 2.5L NS bolus in ED. Received dose of Vancomycin and Cefepime in ED.  [facility], Dr. Pollie MeyerMcIntyre attending. Anticipate hospital stay greater than 2 midnights. - Vitals per floor protocol  - Telemetry. Continouos Pulse Ox.  - O2 as needed to keep O2 saturation >92% - Follow up ABG - Follow up flu panel - Obtain EKG - Trend Troponin - Follow up Blood Cultures, Urine Culture - Repeat Echocardiogram - Duonebs scheduled q4hr. Albuterol as needed. -  NS@100cc /hr and another 500cc bolus. Monitor closely and discontinue if fluid overload suspected.  - Continue Vancomycin. Discontinue Cefepime and initiate Zosyn.   # Diarrhea: Noted by Nursing. Notes diffuse mild abdominal pain. Denies fever or vomiting.  - KUB - Follow up C. Diff, GI Stool Panel - Continue Vancomycin, Zosyn per above - NS@100cc /hr  # Hypokalemia: Potassium 2.9 at admission. KCl Multirun given in ED. - Follow up BMP in AM. Anticipate will need further repletion.   # Hypertension: Currently on Carvedilol 3.125mg  twice daily. Goal <130/80 given history of thoracic ascending aneurysm.  - Monitor BP - Continue home Carvedilol  # Diabetes: Not currently on medication. Glucose 153 at admission. - Monitor glucose on BMP. Consider sliding scale.  # Anxiety: Medications include Xanax 0.25mg  q8hr as needed. - Holding home medication  # Pressure Ulcers: Noted on left lateral heel and right lateral forefoot - Continue to monitor  # Mild to Moderate Dementia: History of incontinence, wearing adult diapers at baseline. Attention is poor at baseline. Usually oriented, however did not cooperate with orientation questions at admission. - High risk of Delirium. Monitor.  # History of PE: Home medication includes Rivaroxaban with plan to continue for 3months, to be evaluated if further treatment is needed in 04/2016. - Continue home Xarelto  # Thoracic Ascending Aneurysm: Unchanged and stable. Goal BP<130/80. - Monitor BP   FEN/GI: NPO. NS@100cc /hr.  Prophylaxis: Xarelto  Disposition: SNF  History of Present Illness:  [name] is a 76 y.o. female presenting with increased work of breathing.  Patient unable to give history of symptoms, but does deny shortness of breath and pain at this time. Mainly focused on dry mouth and requesting something to drink.  Chart review shows four day  history of increased work of breathing, interfering with eating and speaking. Also noted to  have cough. Contacted [facility] and spoke with Nurse who cares for [name]. Reports she has been short of breath and given history of PE they sent her for further evaluation. Also reports one large episode of diarrhea yesterday (liquid stool). Notes chronic history of dry mouth. States [name] is usually oriented to person, but is stubborn and may not answer this question; Not usually oriented to time or place.  In the ED patient was given 5 mg of albuterol and 0.5mg  of atrovent with some improvement in her symptoms. Patient also received 125 mg of Solumedrol. Patient was also placed on enteric precautions for C.diff concerns.  Review Of Systems: Per HPI   ROS  Patient Active Problem List   Diagnosis Date Noted  . Influenza-like symptoms 03/23/2016  . Positive blood culture 01/15/2016  . Acute cystitis without hematuria   . 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 Hypoxia 01/11/2016  . Pulmonary embolism with acute cor pulmonale (HCC) 01/11/2016  . Pressure injury of skin 01/11/2016  . Prediabetes 01/11/2016  . Thoracic aortic aneurysm (HCC) 01/11/2016  . 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 Pulmonary nodules/lesions, multiple   . Anxiety state 09/30/2015  . Weight gain 08/24/2015  . Pruritic intertrigo 07/24/2015  . Hypercalcemia 05/18/2015  . Osteoarthritis 03/11/2015  . Anemia 03/11/2015  . Bone spur of left foot   . 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 Person living in residential institution 12/03/2014  . Diabetes mellitus without complication (HCC) 11/29/2014  . Acute delirium 11/29/2014  . Hyperlipemia   . Essential hypertension   . Fall   . Solitary pulmonary nodule, possible 08/29/2014    Past Medical History: Past Medical History:  Diagnosis Date  . Bone spur of left foot   . Depression   . Diabetes mellitus   . Diabetes mellitus without complication (HCC) 11/29/2014  . Essential hypertension   . Fall   . 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 Gout    . Hemorrhoid   . Hypercalcemia 05/18/2015  . Hyperlipemia   . Hypertension   . Iron deficiency anemia secondary to blood loss (chronic)   . 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 Obesity   . Osteoarthritis   . Person living in residential institution 12/03/2014  . Prediabetes 01/11/2016  . 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 Pulmonary nodules/lesions, multiple   . Solitary pulmonary nodule 01/07/2015  . Thoracic aortic aneurysm (HCC) 01/11/2016  . UTI (lower urinary tract infection) 11/29/2014    Past Surgical History: Past Surgical History:  Procedure Laterality Date  . KNEE SURGERY Right     Social History: Social History  Substance Use Topics  . Smoking status: Former Games developer  . Smokeless tobacco: Not on file  . Alcohol use Not on file   Please also refer to relevant sections of EMR.  Family History: Family History  Problem Relation Age of Onset  . Heart attack Mother   . Diabetes Father    Allergies and Medications: Allergies  Allergen Reactions  . Aspirin Other (See Comments)    Bleeding   . Latex Itching  . Penicillins Rash    Has patient had a PCN reaction causing immediate rash, facial/tongue/throat swelling, SOB or lightheadedness with hypotension: No Has patient had a PCN reaction causing severe rash involving mucus membranes or skin necrosis: No Has patient had a PCN  reaction that required hospitalization No Has patient had a PCN reaction occurring within the last 10 years: No If all of the above answers are "NO", then may proceed with Cephalosporin use.    No current facility-administered medications on file prior to encounter.    Current Outpatient Prescriptions on File Prior to Encounter  Medication Sig Dispense Refill  . acetaminophen (TYLENOL) 650 MG CR tablet Take 650-1,300 mg by mouth See admin instructions. Take 650 mg at 10 AM. Take 1300 mg at [facility]. Take 1300  mg at 2AM.    . albuterol (PROVENTIL HFA;VENTOLIN HFA) 108 (90 BASE) MCG/ACT inhaler Inhale 2 puffs into the lungs every 6 (six) hours as needed for wheezing or shortness of breath. 1 Inhaler 0  . bisacodyl (DULCOLAX) 10 MG suppository Place 10 mg rectally as needed for moderate constipation.    . calcium citrate (CALCITRATE - DOSED IN MG ELEMENTAL CALCIUM) 950 MG tablet Take 200 mg of elemental calcium by mouth daily.    . carvedilol (COREG) 3.125 MG tablet Take 1 tablet (3.125 mg total) by mouth 2 (two) times daily with a meal. (Patient taking differently: Take 3.125 mg by mouth 2 (two) times daily with a meal. Hold if SBP<140 and HR <50) 60 tablet 0  . Cholecalciferol 2000 units CAPS Take 1 capsule (2,000 Units total) by mouth daily. 30 each 0  . guaiFENesin (ROBITUSSIN) 100 MG/5ML SOLN Take 10 mLs by mouth every 4 (four) hours as needed for cough or to loosen phlegm.    . magnesium hydroxide (MILK OF MAGNESIA) 400 MG/5ML suspension Take 30 mLs by mouth daily as needed for mild constipation.    . nitroGLYCERIN (NITROSTAT) 0.4 MG SL tablet Place 0.4 mg under the tongue every 5 (five) minutes as needed for chest pain.    . rivaroxaban (XARELTO) 20 MG TABS tablet Take 1 tablet (20 mg total) by mouth daily with supper. 71 tablet 0  . Sodium Phosphates (RA SALINE ENEMA) 19-7 GM/118ML ENEM Place 1 each rectally as needed (for constipation).     Objective: BP 152/71   Pulse 105   Temp (S) 98.4 F (36.9 C) (Rectal)   Resp 26   Wt 180 lb 12.4 oz (82 kg)   SpO2 98%   BMI 35.31 kg/m  Exam: Physical Exam: General Appearance: Patient is in no acute distress. Uncooperative with exam.  Head/face:  NCAT Eyes:  PERRL and extraocular movements intact. Ears:  Canals and TMs NI Nose/Sinuses:  Nares normal. Septum midline. Mucosa normal. No drainage or sinus tenderness. Blue Eye in place. Mouth/Throat:  Mucosa dry; pharynx without erythema, edema or exudate. Neck:  Supple, no mass, non-tender, no bruits, no  jvd, Adenopathy- absent Lungs:  Bilateral crackles in bases. Expiratory wheeze noted. Able to speak in complete sentences.  Heart:  Heart sounds are normal.  Regular rate and rhythm without murmur, gallop or rub. Heart sounds: Normal S1, S2 Abdomen:  Soft, normal bowel sounds; mild diffuse tenderness  Skin:  Skin color, texture, turgor are normal; there are no bruises, rashes or lesions. Bandages at site of pressure ulcers noted on bilateral feet. Extremities: Extremities warm to touch, pink, with no edema. and pulses  Musculoskeletal:  Spine range of motion normal. Muscular strength intact. Neurologic:  Alert refusing to cooperate with orientation questions or rest of neurological exam, moving all extremities Psych exam: Alert, refusing to cooperate with orientation questions  Labs and Imaging: CBC BMET   Recent Labs Lab 03/23/16 1447  WBC 9.9  HGB 12.4  HCT 40.0  PLT 340    Recent Labs Lab 03/23/16 1447  NA 146*  K 2.8*  CL 110  CO2 23  BUN 29*  CREATININE 0.66  GLUCOSE 153*  CALCIUM 9.4    - BNP 190.9 - Lactic Acid 3.08> 3.32  Dg Chest Portable 1 View  Result Date: 03/23/2016 CLINICAL DATA:  Shortness of breath.  History of pneumonia. EXAM: PORTABLE CHEST 1 VIEW COMPARISON:  CT chest and single view of the chest 01/11/2016. FINDINGS: There is cardiomegaly without edema. No pneumothorax or pleural effusion. Calcified granuloma in the right mid lung is again seen. No bony abnormality. Aortic atherosclerosis is noted. IMPRESSION: No acute disease. Cardiomegaly and atherosclerosis. Electronically Signed   By: Drusilla Kanner M.D.   On: 03/23/2016 15:33   Ct Angio Chest Pe W And/or Wo Contrast  Result Date: 03/23/2016 CLINICAL DATA:  Shortness of breath beginning last night, worse today. EXAM: CT ANGIOGRAPHY CHEST WITH CONTRAST TECHNIQUE: Multidetector CT imaging of the chest was performed using the standard protocol during bolus administration of intravenous contrast.  Multiplanar CT image reconstructions and MIPs were obtained to evaluate the vascular anatomy. CONTRAST:  100 cc Isovue 370. COMPARISON:  CT chest 01/11/2016. FINDINGS: Cardiovascular: Previously seen pulmonary emboli are no longer visualized. There is cardiomegaly. Enlarged pulmonary outflow tract consistent with pulmonary arterial hypertension is again seen. 4.1 cm in diameter ascending thoracic aorta is also again seen. Mediastinum/Nodes: No enlarged mediastinal, hilar, or axillary lymph nodes. Thyroid gland, trachea, and esophagus demonstrate no significant findings. Lungs/Pleura: No pleural effusion. Scattered bilateral subcentimeter pulmonary nodules are again seen. Some of these measure slightly smaller including a right middle lobe nodule on image 85 measuring 0.6 cm in diameter compared to 0.8 on the prior exam. Nodule in the lingula which had measured 0.6 cm measures 0.5 cm on image 86. No new or enlarging nodules identified. Mild scattered ground-glass attenuation bilaterally is seen. Upper Abdomen: Calcifications in the spleen and liver are consistent with old granulomatous disease. Musculoskeletal: Osteopenia multilevel thoracic spondylosis are noted. No lytic or sclerotic lesion. Review of the MIP images confirms the above findings. IMPRESSION: Negative for pulmonary embolus or acute disease. Emboli seen on the comparison examination are normal no longer visualized. Cardiomegaly. Findings compatible pulmonary trunk hypertension. 4.1 cm ascending thoracic aorta, unchanged. Recommend annual imaging followup by CTA or MRA. This recommendation follows 2010 ACCF/AHA/AATS/ACR/ASA/SCA/SCAI/SIR/STS/SVM Guidelines for the Diagnosis and Management of Patients with Thoracic Aortic Disease. Circulation. 2010; 121: Z610-R604. No change to slight decrease in the size of multiple bilateral pulmonary nodules. No new or enlarging nodule is identified. Electronically Signed   By: Drusilla Kanner M.D.   On: 03/23/2016  18:14    Araceli Bouche, DO 03/23/2016, 9:46 PM PGY-3, [facility] FPTS Intern pager: [phone], text pages welcome

## 2016-03-24 ENCOUNTER — Inpatient Hospital Stay (HOSPITAL_COMMUNITY): Payer: Medicare Other

## 2016-03-24 DIAGNOSIS — J9602 Acute respiratory failure with hypercapnia: Secondary | ICD-10-CM

## 2016-03-24 DIAGNOSIS — R197 Diarrhea, unspecified: Secondary | ICD-10-CM | POA: Diagnosis present

## 2016-03-24 DIAGNOSIS — R0602 Shortness of breath: Secondary | ICD-10-CM

## 2016-03-24 DIAGNOSIS — J9601 Acute respiratory failure with hypoxia: Principal | ICD-10-CM

## 2016-03-24 LAB — BASIC METABOLIC PANEL
Anion gap: 9 (ref 5–15)
Anion gap: 9 (ref 5–15)
BUN: 18 mg/dL (ref 6–20)
BUN: 16 mg/dL (ref 6–20)
CHLORIDE: 110 mmol/L (ref 101–111)
CO2: 24 mmol/L (ref 22–32)
CO2: 27 mmol/L (ref 22–32)
Calcium: 8.6 mg/dL — ABNORMAL LOW (ref 8.9–10.3)
Calcium: 9 mg/dL (ref 8.9–10.3)
Chloride: 112 mmol/L — ABNORMAL HIGH (ref 101–111)
Creatinine, Ser: 0.57 mg/dL (ref 0.44–1.00)
Creatinine, Ser: 0.57 mg/dL (ref 0.44–1.00)
GFR calc Af Amer: >60 mL/min (ref >60)
GFR calc Af Amer: >60 mL/min (ref >60)
GLUCOSE: 123 mg/dL — AB (ref 65–99)
GLUCOSE: 120 mg/dL — AB (ref 65–99)
POTASSIUM: 2.7 mmol/L — AB (ref 3.5–5.1)
POTASSIUM: 2.9 mmol/L — AB (ref 3.5–5.1)
Sodium: 145 mmol/L (ref 135–145)
Sodium: 146 mmol/L — ABNORMAL HIGH (ref 135–145)

## 2016-03-24 LAB — URINE CULTURE: CULTURE: NO GROWTH

## 2016-03-24 LAB — BLOOD CULTURE ID PANEL (REFLEXED)
ACINETOBACTER BAUMANNII: NOT DETECTED
CANDIDA ALBICANS: NOT DETECTED
CANDIDA KRUSEI: NOT DETECTED
CANDIDA PARAPSILOSIS: NOT DETECTED
Candida glabrata: NOT DETECTED
Candida tropicalis: NOT DETECTED
ENTEROBACTERIACEAE SPECIES: NOT DETECTED
ESCHERICHIA COLI: NOT DETECTED
Enterobacter cloacae complex: NOT DETECTED
Enterococcus species: NOT DETECTED
HAEMOPHILUS INFLUENZAE: NOT DETECTED
KLEBSIELLA OXYTOCA: NOT DETECTED
Klebsiella pneumoniae: NOT DETECTED
Listeria monocytogenes: NOT DETECTED
METHICILLIN RESISTANCE: DETECTED — AB
Neisseria meningitidis: NOT DETECTED
PSEUDOMONAS AERUGINOSA: NOT DETECTED
Proteus species: NOT DETECTED
STREPTOCOCCUS PNEUMONIAE: NOT DETECTED
STREPTOCOCCUS PYOGENES: NOT DETECTED
Serratia marcescens: NOT DETECTED
Staphylococcus aureus (BCID): NOT DETECTED
Staphylococcus species: DETECTED — AB
Streptococcus agalactiae: NOT DETECTED
Streptococcus species: NOT DETECTED

## 2016-03-24 LAB — LACTIC ACID, PLASMA
LACTIC ACID, VENOUS: 2.4 mmol/L — AB (ref 0.5–1.9)
LACTIC ACID, VENOUS: 3.6 mmol/L — AB (ref 0.5–1.9)
Lactic Acid, Venous: 2 mmol/L (ref 0.5–1.9)

## 2016-03-24 LAB — MAGNESIUM: Magnesium: 1.6 mg/dL — ABNORMAL LOW (ref 1.7–2.4)

## 2016-03-24 LAB — I-STAT ARTERIAL BLOOD GAS, ED
Bicarbonate: 25.5 mmol/L (ref 20.0–28.0)
O2 SAT: 99 %
PCO2 ART: 43.7 mmHg (ref 32.0–48.0)
Patient temperature: 99.2
TCO2: 27 mmol/L (ref 0–100)
pH, Arterial: 7.376 (ref 7.350–7.450)
pO2, Arterial: 162 mmHg — ABNORMAL HIGH (ref 83.0–108.0)

## 2016-03-24 LAB — COMPREHENSIVE METABOLIC PANEL
ALK PHOS: 67 U/L (ref 38–126)
ALT: 21 U/L (ref 14–54)
ANION GAP: 11 (ref 5–15)
AST: 37 U/L (ref 15–41)
Albumin: 2.3 g/dL — ABNORMAL LOW (ref 3.5–5.0)
BUN: 22 mg/dL — ABNORMAL HIGH (ref 6–20)
CALCIUM: 8.2 mg/dL — AB (ref 8.9–10.3)
CO2: 19 mmol/L — ABNORMAL LOW (ref 22–32)
Chloride: 116 mmol/L — ABNORMAL HIGH (ref 101–111)
Creatinine, Ser: 0.57 mg/dL (ref 0.44–1.00)
GFR calc non Af Amer: >60 mL/min (ref >60)
Glucose, Bld: 161 mg/dL — ABNORMAL HIGH (ref 65–99)
Potassium: 2.5 mmol/L — CL (ref 3.5–5.1)
SODIUM: 146 mmol/L — AB (ref 135–145)
TOTAL PROTEIN: 5.7 g/dL — AB (ref 6.5–8.1)
Total Bilirubin: 0.3 mg/dL (ref 0.3–1.2)

## 2016-03-24 LAB — CBC
HCT: 34 % — ABNORMAL LOW (ref 36.0–46.0)
Hemoglobin: 10.8 g/dL — ABNORMAL LOW (ref 12.0–15.0)
MCH: 30.6 pg (ref 26.0–34.0)
MCHC: 31.8 g/dL (ref 30.0–36.0)
MCV: 96.3 fL (ref 78.0–100.0)
PLATELETS: 241 10*3/uL (ref 150–400)
RBC: 3.53 MIL/uL — AB (ref 3.87–5.11)
RDW: 14.4 % (ref 11.5–15.5)
WBC: 5.9 10*3/uL (ref 4.0–10.5)

## 2016-03-24 LAB — INFLUENZA PANEL BY PCR (TYPE A & B)
INFLBPCR: NEGATIVE
Influenza A By PCR: POSITIVE — AB

## 2016-03-24 LAB — TROPONIN I

## 2016-03-24 LAB — GLUCOSE, CAPILLARY: Glucose-Capillary: 110 mg/dL — ABNORMAL HIGH (ref 65–99)

## 2016-03-24 MED ORDER — SODIUM CHLORIDE 0.9 % IV BOLUS (SEPSIS)
1000.0000 mL | Freq: Once | INTRAVENOUS | Status: AC
  Administered 2016-03-24: 1000 mL via INTRAVENOUS

## 2016-03-24 MED ORDER — IPRATROPIUM-ALBUTEROL 0.5-2.5 (3) MG/3ML IN SOLN
3.0000 mL | Freq: Three times a day (TID) | RESPIRATORY_TRACT | Status: DC
  Administered 2016-03-25: 3 mL via RESPIRATORY_TRACT
  Filled 2016-03-24: qty 3

## 2016-03-24 MED ORDER — MAGNESIUM SULFATE 2 GM/50ML IV SOLN
2.0000 g | Freq: Once | INTRAVENOUS | Status: AC
  Administered 2016-03-24: 2 g via INTRAVENOUS
  Filled 2016-03-24: qty 50

## 2016-03-24 MED ORDER — VANCOMYCIN HCL IN DEXTROSE 750-5 MG/150ML-% IV SOLN
750.0000 mg | Freq: Two times a day (BID) | INTRAVENOUS | Status: DC
  Administered 2016-03-24 (×2): 750 mg via INTRAVENOUS
  Filled 2016-03-24 (×3): qty 150

## 2016-03-24 MED ORDER — SODIUM CHLORIDE 0.9 % IV SOLN
30.0000 meq | Freq: Once | INTRAVENOUS | Status: AC
  Administered 2016-03-24: 30 meq via INTRAVENOUS
  Filled 2016-03-24: qty 15

## 2016-03-24 MED ORDER — OSELTAMIVIR PHOSPHATE 75 MG PO CAPS
75.0000 mg | ORAL_CAPSULE | Freq: Two times a day (BID) | ORAL | Status: AC
  Administered 2016-03-24 – 2016-03-29 (×10): 75 mg via ORAL
  Filled 2016-03-24 (×11): qty 1

## 2016-03-24 MED ORDER — POTASSIUM CHLORIDE CRYS ER 20 MEQ PO TBCR
40.0000 meq | EXTENDED_RELEASE_TABLET | Freq: Two times a day (BID) | ORAL | Status: AC
  Administered 2016-03-24 – 2016-03-25 (×2): 40 meq via ORAL
  Filled 2016-03-24 (×2): qty 2

## 2016-03-24 MED ORDER — SODIUM CHLORIDE 0.9 % IV BOLUS (SEPSIS)
500.0000 mL | Freq: Once | INTRAVENOUS | Status: AC
  Administered 2016-03-24: 500 mL via INTRAVENOUS

## 2016-03-24 NOTE — Discharge Summary (Signed)
Family Medicine Teaching [facility] Discharge Summary  Patient name: [name] Medical record number: [id number] Date of birth: 1940/12/19 Age: 76 y.o. Gender: female Date of Admission: 03/23/2016  Date of Discharge: 03/29/2016 Admitting Physician: Latrelle DodrillBrittany J McIntyre, MD  Primary Care Provider: Rodrigo Ranrystal Dorsey, MD Consultants: critical care medicine  Indication for Hospitalization: diarrhea and increased WOB meeting SIRS  Discharge Diagnoses/Problem List:  Respiratory Failure with Hypoxia meeting SIRS Norovirus positive diarrhea Influenza positive Lower Extremity Pressure Ulcers Hypokalemia Hypertension Diabetes mellitus Anxiety Mild to Moderate Dementia with cognitive impairment History of PE Thoracic Ascending Aneurysm  Disposition: SNF ([facility])  Discharge Condition: stable, improved  Discharge Exam:  General: well nourished, well developed, confused with non-toxic appearance HEENT: normocephalic, atraumatic, moist mucous membranes CV: regular rate and rhythm without murmurs, rubs, or gallops Lungs: coarse breath sounds b/l without wheeze bilaterally with no increased work of breathing on RA Abdomen: soft, minimally distended, minimally diffusely tender without rebound or guarding, no masses or organomegaly palpable, normoactive bowel sounds present Skin: warm, dry, no rashes, cap refill < 2 seconds Extremities: warm and well perfused, normal tone, non-edematous, two ulcers with dressings on right heel and left medial malleolus  Brief Hospital Course:  Lynn LocketMina S Carsonis a 76 y.o.femalepresenting with shortness of breath. PMH is significant for multifocal atrial tachycardia, h/o PE (12/2015), Thoracic Aortic Aneurysm, Pulmonary Hypertension, Anxiety, diabetes, hypertension, hyperlipidemia.   [facility], and increased WOB requiring oxygen meeting SIRS criteria.   Pertinent labs include: BNP 190.9, Troponin negative, Lactic  Acid 3.32, WBC normal at 9.9, flu positive, norovirus positive, C-diff antigen pos with neg toxin and pos PCR. CXR w/o acute pathology, no signs of pleural edema. CTA chest w/o PE, with stable thoracic aortic aneurysm, pulmonary hypertension present.  She was initially started on vanc and zosyn but discontinued after found to be flu positive. The patients diarrhea was thought to be 2/2 norovirus. Concerning the positive C-diff, pharm ID were consulted and rec not treating given more likely she was colonized but not infectious. Her respiratory status improved and she was able to wean off O2. Her weight was at her baseline during admission. Patient was able to show improvement with PO intake with assistance when eating. She maintained hydration.  The patient maintained elevated BPs during hospitalization. This was thought to be in combination with flu and norovirus positivity and underlying anxiety with dementia. Patient was started on HCTZ upon d/c for systolic elevations. This patient care was discussed with the geriatric attending.   She remained stable and afebrile and was safe [facility]  Issues for Follow Up:  1. Patient was flu positive and completed Tamiflu for 5 days. Respiratory status seems improved and thought to be 2/2 to this. 2. Diarrhea improved with norovirus positive. Antibiotics were stopped and patient was given supportive care. Continue to monitor output. 3. Wound care was consulted concerning multiple lower extremity ulcers. Please continue to keep this dressed and managed to prevent further breakdown. 4. Get BMET to recheck potassium level given diarrhea and hypokalemia during hospitalization. Patient level improved and she was given potassium prior to discharge. 5. For hypertension, continue carvedilol 6.25 mg BID and HCTZ 12.5 mg QD. 6. Will stop Klonopin given this did not improve her anxiety and is known to exacerbate delerium. 7. Continue xarelto for h/o PE.  Thoracic AA is stable. Goal is to get BP<130/80. 8. Patient has dysphagia 2 diet. IMPORTANT to know, patient needs assistance with feeds  given decline in cognition due to dementia.  Significant Procedures: none  Significant Labs and Imaging: BCx (01/03): MRSA x1 of 2 Cx, c/w probable contaminant BCx (01/04): negative UCx: NGTD Flu: positive ABG: 7.182>7.267>7.376 Lactic acid: 3.6>2.4>2.0 Troponin: negative BNP: 190.0 GI panel: norovirus+ C-diff: pos antigen, neg toxin C-diff PCR: positive   Recent Labs Lab 03/25/16 0742 03/26/16 0208 03/28/16 1011  WBC 9.9 10.0 10.0  HGB 11.0* 10.3* 12.8  HCT 34.4* 33.0* 39.9  PLT 285 303 295    Recent Labs Lab 03/23/16 1447 03/24/16 0253  03/24/16 1750 03/25/16 0742 03/26/16 0208 03/28/16 1011 03/29/16 0845  NA 146* 146*  < > 146* 145 141 143 140  K 2.8* 2.5*  < > 2.9* 3.2* 3.7 3.3* 3.3*  CL 110 116*  < > 110 109 104 100* 97*  CO2 23 19*  < > 27 27 29  33* 37*  GLUCOSE 153* 161*  < > 120* 80 76 111* 124*  BUN 29* 22*  < > 16 13 10  5* 12  CREATININE 0.66 0.57  < > 0.57 0.53 0.44 0.43* 0.45  CALCIUM 9.4 8.2*  < > 9.0 8.9 8.5* 9.3 9.6  MG 1.9  --   --  1.6*  --   --   --   --   ALKPHOS 94 67  --   --   --   --   --   --   AST 42* 37  --   --   --   --   --   --   ALT 21 21  --   --   --   --   --   --   ALBUMIN 2.7* 2.3*  --   --   --   --   --   --   < > = values in this interval not displayed. Imaging/Diagnostic Tests: Transthoracic Echocardiography (03/24/2016) Study Conclusions - Left ventricle: The cavity size was normal. Systolic function was normal. The estimated ejection fraction was in the range of 55% to 60%. Wall motion was normal; there were no regional wall motion abnormalities. Doppler parameters are consistent with abnormal left ventricular relaxation (grade 1 diastolic dysfunction). - Aortic valve: There was mild regurgitation. - Atrial septum: There was increased thickness of the  septum, consistent with lipomatous hypertrophy. No defect or patent foramen ovale was identified.  DG Abd Portable 1 View (03/24/2016) FINDINGS: Decreased gaseous distention of bowel loops throughout the abdomen. No bowel wall thickening or evidence of obstruction. Excreted contrast within urinary bladder. Atherosclerotic calcification aorta and iliac arteries bilaterally. Bones demineralized.  IMPRESSION: Decreased gaseous distention of bowel since previous exam. Aortic atherosclerosis.  Abd 1 View (KUB) (03/23/2016) FINDINGS: There is mild gaseous distention of small and large bowel. Contrast in the urinary bladder from chest CT earlier today is noted. Atherosclerotic vascular disease is seen.  IMPRESSION: Bowel gas pattern most in keeping with ileus.  CT Angio Chest PE W and/or Wo Contrast (03/23/2016) FINDINGS: Cardiovascular: Previously seen pulmonary emboli are no longer visualized. There is cardiomegaly. Enlarged pulmonary outflow tract consistent with pulmonary arterial hypertension is again seen. 4.1 cm in diameter ascending thoracic aorta is also again seen.  Mediastinum/Nodes: No enlarged mediastinal, hilar, or axillary lymph nodes. Thyroid gland, trachea, and esophagus demonstrate no significant findings.  Lungs/Pleura: No pleural effusion. Scattered bilateral subcentimeter pulmonary nodules are again seen. Some of these measure slightly smaller including a right middle lobe nodule on image 85 measuring 0.6 cm in diameter compared  to 0.8 on the prior exam. Nodule in the lingula which had measured 0.6 cm measures 0.5 cm on image 86. No new or enlarging nodules identified. Mild scattered ground-glass attenuation bilaterally is seen.  Upper Abdomen: Calcifications in the spleen and liver are consistent with old granulomatous disease.  Musculoskeletal: Osteopenia multilevel thoracic spondylosis are noted. No lytic or sclerotic lesion.  Review of the  MIP images confirms the above findings.  IMPRESSION: Negative for pulmonary embolus or acute disease. Emboli seen on the comparison examination are normal no longer visualized.  Cardiomegaly.  Findings compatible pulmonary trunk hypertension.  4.1 cm ascending thoracic aorta, unchanged. Recommend annual imaging followup by CTA or MRA. This recommendation follows 2010 ACCF/AHA/AATS/ACR/ASA/SCA/SCAI/SIR/STS/SVM Guidelines for the Diagnosis and Management of Patients with Thoracic Aortic Disease. Circulation. 2010; 121: Z610-R604.  No change to slight decrease in the size of multiple bilateral pulmonary nodules. No new or enlarging nodule is identified.  DG Chest Portable 1 View (03/23/2016) FINDINGS: There is cardiomegaly without edema. No pneumothorax or pleural effusion. Calcified granuloma in the right mid lung is again seen. No bony abnormality. Aortic atherosclerosis is noted.  IMPRESSION: No acute disease.  Results/Tests Pending at Time of Discharge: none  Discharge Medications:  Allergies as of 03/29/2016      Reactions   Aspirin Other (See Comments)   Bleeding    Latex Itching   Penicillins Rash   Has patient had a PCN reaction causing immediate rash, facial/tongue/throat swelling, SOB or lightheadedness with hypotension: No Has patient had a PCN reaction causing severe rash involving mucus membranes or skin necrosis: No Has patient had a PCN reaction that required hospitalization No Has patient had a PCN reaction occurring within the last 10 years: No If all of the above answers are "NO", then may proceed with Cephalosporin use.      Medication List    TAKE these medications   acetaminophen 650 MG CR tablet Commonly known as:  TYLENOL Take 650-1,300 mg by mouth See admin instructions. Take 650 mg at 10 AM. Take 1300 mg at 6PM. Take 1300 mg at 2AM.   albuterol 0.63 MG/3ML nebulizer solution Commonly known as:  ACCUNEB Take 1 ampule by nebulization every 4  (four) hours as needed for wheezing.   albuterol 108 (90 Base) MCG/ACT inhaler Commonly known as:  PROVENTIL HFA;VENTOLIN HFA Inhale 2 puffs into the lungs every 6 (six) hours as needed for wheezing or shortness of breath.   BIOFREEZE 4 % Gel Generic drug:  Menthol (Topical Analgesic) Apply 1 application topically 4 (four) times daily as needed (pain).   bisacodyl 10 MG suppository Commonly known as:  DULCOLAX Place 10 mg rectally as needed for moderate constipation.   calcium citrate 950 MG tablet Commonly known as:  CALCITRATE - dosed in mg elemental calcium Take 200 mg of elemental calcium by mouth daily.   carvedilol 6.25 MG tablet Commonly known as:  COREG Take 1 tablet (6.25 mg total) by mouth 2 (two) times daily with a meal. What changed:  medication strength  how much to take   Cholecalciferol 2000 units Caps Take 1 capsule (2,000 Units total) by mouth daily.   feeding supplement (PRO-STAT SUGAR FREE 64) Liqd Take 30 mLs by mouth 3 (three) times daily between meals.   guaiFENesin 100 MG/5ML Soln Commonly known as:  ROBITUSSIN Take 10 mLs by mouth every 4 (four) hours as needed for cough or to loosen phlegm.   hydrochlorothiazide 12.5 MG tablet Commonly known as:  HYDRODIURIL Take 1  tablet (12.5 mg total) by mouth daily.   magnesium hydroxide 400 MG/5ML suspension Commonly known as:  MILK OF MAGNESIA Take 30 mLs by mouth daily as needed for mild constipation.   nitroGLYCERIN 0.4 MG SL tablet Commonly known as:  NITROSTAT Place 0.4 mg under the tongue every 5 (five) minutes as needed for chest pain.   RA SALINE ENEMA 19-7 GM/118ML Enem Place 1 each rectally as needed (for constipation).   rivaroxaban 20 MG Tabs tablet Commonly known as:  XARELTO Take 1 tablet (20 mg total) by mouth daily with supper.       Discharge Instructions: Please refer to Patient Instructions section of EMR for full details.  Patient was counseled important signs and symptoms  that should prompt return to medical care, changes in medications, dietary instructions, activity restrictions, and follow up appointments.   Follow-Up Appointments: Patient to go to SNF    Wendee Beavers, DO 03/29/2016, 2:56 PM PGY-1, [facility]

## 2016-03-24 NOTE — ED Notes (Signed)
Incontinence care given at this time.  Large amount of urine output noted.  Small brown liquid stool noted as well.  Aleven dressing applied to sacrum and right inner ankle for pressure wounds that patient arrived with.  Remains on cpap.  Tolerating well.  Bed in lowest position with call bell within reach.

## 2016-03-24 NOTE — ED Notes (Signed)
Patient tolerating BiPAP at this time.  Patient is alert and following commands.  Here for shortness of breath and worsening CHF.  EF was 65% a year ago and is to have echo done to see progression. Last ABG on 03-24-15 at 22:32 showed improvement in pH to 7.26 from 7.18.  Lactic is trending upward last was 3.6 at 00:31.

## 2016-03-24 NOTE — ED Notes (Signed)
Per Joneen RoachPaul Hoffman we are to leave patient on bipap until 0700.  If patient is mentating okay remove and place ng tube.

## 2016-03-24 NOTE — Progress Notes (Signed)
Family Medicine Teaching Service Daily Progress Note Intern Pager: [phone]  Patient name: [name] Medical record number: [id number] Date of birth: [birth year] Age: 76 y.o. Gender: female  Primary Care Provider: Rodrigo Ranrystal Dorsey, MD Consultants: CCM Code Status: full  Pt Overview and Major Events to Date:  01/03: admit for increased WOB and diarrhea meeting SIRS criteria  Assessment and Plan: Lynn KudoMina S Kirkland is a 76 y.o. female presenting with shortness of breath. PMH is significant for multifocal atrial tachycardia, h/o PE (12/2015), Thoracic Aortic Aneurysm, Pulmonary Hypertension, Anxiety, diabetes, hypertension, hyperlipidemia.   #Acute Respiratory Failure with Hypoxia meeting SIRS, Acute, Imporved: concerning for sepsis. Arrived from nursing home with increased WOB. Initially needed BiPAP but able to wean to [place] 4 L, baseline is Room Air. Weight stable, 180 lbs here and in 08/2015. BNP slightly elevated to 190.9, no prior for comparison. Troponin negative. Lactic Acid 3.32, trending down. WBC wnl at 9.9. CXR without acute pathology, no signs of pleural edema. CTA chest without PE, thoracic aortic aneurysm stable. Meets SIRS criteria, qSOFA 2. Suspected pulmonary HTN given evidence of pulmonary artery enlargement on CTA. History of PE in 12/2015, currently on Xarelto. Bilateral Pulmonary Nodules noted, patient declining further imaging since she does not want aggressive treatments if it is cancer. Last ECHO from 02/2015 with EF 60-65%, difficult study. Received 2.5L NS bolus in ED. Received dose of Vancomycin and Cefepime in ED. Continues to show improvement with resolution of respiratory acidosis. --Telemetry --Continouos Pulse Ox.  --O2 as needed to keep O2 saturation >92%, off BiPAP, on [place] 4 L --Follow up flu panel --Obtain EKG --Follow up Blood Cultures, Urine Culture --Repeat ECHO --Duonebs scheduled q4hr, albuterol PRN --NS @100cc /hr and another 500cc bolus, monitor closely and  discontinue if fluid overload suspected --Continue Vancomycin and Zosyn (01/03>>)  #Diarrhea, Acute, Stable: Noted by Nursing. Notes diffuse mild abdominal pain. Denies fever or vomiting. Initial KUB c/w ileus w/o obstruction. Multiple stools since admission per RN. --F/u repeat KUB --F/u c-diff and GI panel --Continue Vancomycin, Zosyn per above --NS @100cc /hr  #Hypokalemia, Acute, Worsening: Potassium 2.9 at admission, now 2.5. KCl Multirun given in ED, will replete as needed. Likely due to diarrhea. --Follow up BMP in AM --Repletion is necessary   #Hypertension, Chronic, Stable: Currently on Carvedilol 3.125 mg twice daily. Goal <130/80 given history of thoracic ascending aneurysm.  --Monitor BP --Continue home Carvedilol  #Diabetes Mellitus, Chronic, Stable: Not currently on medication. Glucose 153 at admission. --Monitor glucose on BMP --Consider sliding scale  #Anxiety, Chronic, Stable: Medications include Xanax 0.25mg  q8hr as needed. --Holding home medication  #Pressure Ulcers: Noted on left lateral heel and right lateral forefoot --Continue to monitor  #Mild to Moderate Dementia: History of incontinence, wearing adult diapers at baseline. Attention is poor at baseline. Usually oriented, however did not cooperate with orientation questions at admission. --High risk of Delirium. Monitor.  #History of PE: Home medication includes Rivaroxaban with plan to continue for 3 months, to be evaluated if further treatment is needed in 04/2016. --Continue home Xarelto  #Thoracic Ascending Aneurysm: Unchanged and stable. Goal BP<130/80. --Monitor BP   FEN/GI: NPO. NS@100cc /hr.  PPx: Xarelto  Disposition: pending improvement of respiratory status, [facility]  Subjective:  Patient states she is breathing heavy asking where her son is at. Patient unable to give additional information this AM given respiratory status and underlying  dementia.  Objective: Temp:  [98.4 F (36.9 C)-99.2 F (37.3 C)] 99.2 F (37.3 C) (01/03 1433) Pulse  Rate:  [69-117] 79 (01/04 0700) Resp:  [14-44] 28 (01/04 0700) BP: (110-170)/(61-113) 130/69 (01/04 0700) SpO2:  [92 %-100 %] 100 % (01/04 0700) FiO2 (%):  [50 %] 50 % (01/04 0829) Weight:  [180 lb 12.4 oz (82 kg)] 180 lb 12.4 oz (82 kg) (01/03 1524) Physical Exam: General: well nourished, well developed, in mild acute respiratory distress with non-toxic appearance HEENT: normocephalic, atraumatic, moist mucous membranes Neck: supple, non-tender without lymphadenopathy CV: irregularly irregular without murmurs, rubs, or gallops Lungs: diffuse rhonchi and coarse breath sounds with mild expiratory wheeze bilaterally with increased work of breathing on BiPAP Abdomen: soft, mildly distended, mildly diffusely tender without rebound or guarding, no masses or organomegaly palpable, normoactive bowel sounds present Skin: warm, dry, no rashes or lesions, cap refill < 2 seconds Extremities: warm and well perfused, normal tone, non-edematous  Laboratory:  Recent Labs Lab 03/23/16 1447 03/24/16 0253  WBC 9.9 5.9  HGB 12.4 10.8*  HCT 40.0 34.0*  PLT 340 241    Recent Labs Lab 03/23/16 1447 03/24/16 0253  NA 146* 146*  K 2.8* 2.5*  CL 110 116*  CO2 23 19*  BUN 29* 22*  CREATININE 0.66 0.57  CALCIUM 9.4 8.2*  PROT 6.6 5.7*  BILITOT 0.3 0.3  ALKPHOS 94 67  ALT 21 21  AST 42* 37  GLUCOSE 153* 161*    Imaging/Diagnostic Tests: DG Abd Portable 1 View (03/24/2016) FINDINGS: Decreased gaseous distention of bowel loops throughout the abdomen. No bowel wall thickening or evidence of obstruction. Excreted contrast within urinary bladder. Atherosclerotic calcification aorta and iliac arteries bilaterally. Bones demineralized.  IMPRESSION: Decreased gaseous distention of bowel since previous exam. Aortic atherosclerosis.  Abd 1 View (KUB) (03/23/2016) FINDINGS: There is mild  gaseous distention of small and large bowel. Contrast in the urinary bladder from chest CT earlier today is noted. Atherosclerotic vascular disease is seen.  IMPRESSION: Bowel gas pattern most in keeping with ileus.  CT Angio Chest PE W and/or Wo Contrast (03/23/2016) FINDINGS: Cardiovascular: Previously seen pulmonary emboli are no longer visualized. There is cardiomegaly. Enlarged pulmonary outflow tract consistent with pulmonary arterial hypertension is again seen. 4.1 cm in diameter ascending thoracic aorta is also again seen.  Mediastinum/Nodes: No enlarged mediastinal, hilar, or axillary lymph nodes. Thyroid gland, trachea, and esophagus demonstrate no significant findings.  Lungs/Pleura: No pleural effusion. Scattered bilateral subcentimeter pulmonary nodules are again seen. Some of these measure slightly smaller including a right middle lobe nodule on image 85 measuring 0.6 cm in diameter compared to 0.8 on the prior exam. Nodule in the lingula which had measured 0.6 cm measures 0.5 cm on image 86. No new or enlarging nodules identified. Mild scattered ground-glass attenuation bilaterally is seen.  Upper Abdomen: Calcifications in the spleen and liver are consistent with old granulomatous disease.  Musculoskeletal: Osteopenia multilevel thoracic spondylosis are noted. No lytic or sclerotic lesion.  Review of the MIP images confirms the above findings.  IMPRESSION: Negative for pulmonary embolus or acute disease. Emboli seen on the comparison examination are normal no longer visualized.  Cardiomegaly.  Findings compatible pulmonary trunk hypertension.  4.1 cm ascending thoracic aorta, unchanged. Recommend annual imaging followup by CTA or MRA. This recommendation follows 2010 ACCF/AHA/AATS/ACR/ASA/SCA/SCAI/SIR/STS/SVM Guidelines for the Diagnosis and Management of Patients with Thoracic Aortic Disease. Circulation. 2010; 121: W098-J191.  No change to  slight decrease in the size of multiple bilateral pulmonary nodules. No new or enlarging nodule is identified.  DG Chest Portable 1 View (03/23/2016) FINDINGS: There is cardiomegaly  without edema. No pneumothorax or pleural effusion. Calcified granuloma in the right mid lung is again seen. No bony abnormality. Aortic atherosclerosis is noted.  IMPRESSION: No acute disease.   Wendee Beavers, DO 03/24/2016, 8:36 AM PGY-1, [place] [facility] FPTS Intern pager: [phone], text pages welcome

## 2016-03-24 NOTE — Progress Notes (Signed)
  PHARMACY - PHYSICIAN COMMUNICATION CRITICAL VALUE ALERT - BLOOD CULTURE IDENTIFICATION (BCID)  Results for orders placed or performed during the hospital encounter of 03/23/16  Blood Culture ID Panel (Reflexed) (Collected: 03/23/2016  2:58 PM)  Result Value Ref Range   Enterococcus species NOT DETECTED NOT DETECTED   Listeria monocytogenes NOT DETECTED NOT DETECTED   Staphylococcus species DETECTED (A) NOT DETECTED   Staphylococcus aureus NOT DETECTED NOT DETECTED   Methicillin resistance DETECTED (A) NOT DETECTED   Streptococcus species NOT DETECTED NOT DETECTED   Streptococcus agalactiae NOT DETECTED NOT DETECTED   Streptococcus pneumoniae NOT DETECTED NOT DETECTED   Streptococcus pyogenes NOT DETECTED NOT DETECTED   Acinetobacter baumannii NOT DETECTED NOT DETECTED   Enterobacteriaceae species NOT DETECTED NOT DETECTED   Enterobacter cloacae complex NOT DETECTED NOT DETECTED   Escherichia coli NOT DETECTED NOT DETECTED   Klebsiella oxytoca NOT DETECTED NOT DETECTED   Klebsiella pneumoniae NOT DETECTED NOT DETECTED   Proteus species NOT DETECTED NOT DETECTED   Serratia marcescens NOT DETECTED NOT DETECTED   Haemophilus influenzae NOT DETECTED NOT DETECTED   Neisseria meningitidis NOT DETECTED NOT DETECTED   Pseudomonas aeruginosa NOT DETECTED NOT DETECTED   Candida albicans NOT DETECTED NOT DETECTED   Candida glabrata NOT DETECTED NOT DETECTED   Candida krusei NOT DETECTED NOT DETECTED   Candida parapsilosis NOT DETECTED NOT DETECTED   Candida tropicalis NOT DETECTED NOT DETECTED    Name of physician (or Provider) Contacted: FMTS  Changes to prescribed antibiotics required: 1/2 BCx with Staph species - mec A positive - likely MRSE. The patient is empirically on Vancomycin + Zosyn for PNA coverage - no changes are needed at this time.   Rolley SimsMartin, Shaya Altamura Ann 03/24/2016  12:42 PM

## 2016-03-24 NOTE — ED Notes (Signed)
Lab called with potassium result of 2.7. Paged admitting MD. Awaiting a return page.

## 2016-03-24 NOTE — Progress Notes (Signed)
ABG obtained and results WNL. BIPAP removed and placed on 4lnc. Vital signs stable. Patient tolerating well at this time. RT will continue to monitor.

## 2016-03-24 NOTE — Consult Note (Signed)
WOC nurse reviewed consult, patient currently radiology for testing.  To be admitted to SDU.  WOC will attempt to see patient once she arrives to the floor.  Noted to have documentation of sacral pressure injury, bilateral heel pressure injuries, and LE wound. Will go ahead and request Prevalon boots now and patient to be placed on air mattress.  Saria Haran [facility] MSN, RN,CWOCN, CNS [phone]

## 2016-03-24 NOTE — ED Notes (Signed)
Pt very confused and saying she doesn't understand what is going on. When I try to explain she says" I don't understand." Yelling call my son.

## 2016-03-24 NOTE — ED Notes (Signed)
Admitting MD returned page. New orders received and pt still to be NPO.

## 2016-03-24 NOTE — Progress Notes (Signed)
Report called to RN on 4N.

## 2016-03-24 NOTE — ED Notes (Signed)
Family medicine at bedside.

## 2016-03-24 NOTE — Progress Notes (Signed)
Patient currently unable to tolerate a gastric tube insertion. FMTS notified and order received to wait until the morning. New order of q4h CBG check received as patient continues to be NPO. Will continue to monitor closely.

## 2016-03-24 NOTE — Progress Notes (Signed)
Neb treatment given inline with the NIV machine.

## 2016-03-24 NOTE — ED Notes (Signed)
Patient is resting comfortably. 

## 2016-03-25 ENCOUNTER — Inpatient Hospital Stay (HOSPITAL_COMMUNITY): Payer: Medicare Other

## 2016-03-25 DIAGNOSIS — R0600 Dyspnea, unspecified: Secondary | ICD-10-CM

## 2016-03-25 DIAGNOSIS — A09 Infectious gastroenteritis and colitis, unspecified: Secondary | ICD-10-CM

## 2016-03-25 DIAGNOSIS — R6889 Other general symptoms and signs: Secondary | ICD-10-CM

## 2016-03-25 LAB — ECHOCARDIOGRAM COMPLETE: WEIGHTICAEL: 2892.44 [oz_av]

## 2016-03-25 LAB — GLUCOSE, CAPILLARY
GLUCOSE-CAPILLARY: 86 mg/dL (ref 65–99)
GLUCOSE-CAPILLARY: 75 mg/dL (ref 65–99)
GLUCOSE-CAPILLARY: 93 mg/dL (ref 65–99)
Glucose-Capillary: 86 mg/dL (ref 65–99)
Glucose-Capillary: 117 mg/dL — ABNORMAL HIGH (ref 65–99)

## 2016-03-25 LAB — BASIC METABOLIC PANEL
Anion gap: 9 (ref 5–15)
BUN: 13 mg/dL (ref 6–20)
CO2: 27 mmol/L (ref 22–32)
CREATININE: 0.53 mg/dL (ref 0.44–1.00)
Calcium: 8.9 mg/dL (ref 8.9–10.3)
Chloride: 109 mmol/L (ref 101–111)
GFR calc Af Amer: >60 mL/min (ref >60)
Glucose, Bld: 80 mg/dL (ref 65–99)
Potassium: 3.2 mmol/L — ABNORMAL LOW (ref 3.5–5.1)
SODIUM: 145 mmol/L (ref 135–145)

## 2016-03-25 LAB — MRSA PCR SCREENING: MRSA BY PCR: NEGATIVE

## 2016-03-25 LAB — CBC
HCT: 34.4 % — ABNORMAL LOW (ref 36.0–46.0)
Hemoglobin: 11 g/dL — ABNORMAL LOW (ref 12.0–15.0)
MCH: 29.9 pg (ref 26.0–34.0)
MCHC: 32 g/dL (ref 30.0–36.0)
MCV: 93.5 fL (ref 78.0–100.0)
Platelets: 285 10*3/uL (ref 150–400)
RBC: 3.68 MIL/uL — ABNORMAL LOW (ref 3.87–5.11)
RDW: 14.3 % (ref 11.5–15.5)
WBC: 9.9 10*3/uL (ref 4.0–10.5)

## 2016-03-25 LAB — CULTURE, BLOOD (ROUTINE X 2)

## 2016-03-25 MED ORDER — BARRIER CREAM NON-SPECIFIED
1.0000 "application " | TOPICAL_CREAM | Freq: Two times a day (BID) | TOPICAL | Status: DC | PRN
  Filled 2016-03-25: qty 1

## 2016-03-25 MED ORDER — IBUPROFEN 200 MG PO TABS: 100.0000 mg | ORAL_TABLET | Freq: Three times a day (TID) | ORAL | Status: DC | PRN

## 2016-03-25 MED ORDER — BOOST / RESOURCE BREEZE PO LIQD
1.0000 | Freq: Three times a day (TID) | ORAL | Status: DC
Start: 2016-03-25 — End: 2016-03-28
  Administered 2016-03-25 – 2016-03-28 (×10): 1 via ORAL

## 2016-03-25 MED ORDER — ALBUTEROL SULFATE (2.5 MG/3ML) 0.083% IN NEBU: 3.0000 mL | INHALATION_SOLUTION | RESPIRATORY_TRACT | Status: DC | PRN

## 2016-03-25 MED ORDER — COLLAGENASE 250 UNIT/GM EX OINT
TOPICAL_OINTMENT | Freq: Every day | CUTANEOUS | Status: DC
  Administered 2016-03-25: 17:00:00 via TOPICAL
  Administered 2016-03-26: 1 via TOPICAL
  Administered 2016-03-27: 15:00:00 via TOPICAL
  Administered 2016-03-28: 2 via TOPICAL
  Administered 2016-03-28: 10:00:00 via TOPICAL
  Filled 2016-03-25: qty 30

## 2016-03-25 MED ORDER — ADULT MULTIVITAMIN W/MINERALS CH
1.0000 | ORAL_TABLET | Freq: Every day | ORAL | Status: DC
  Administered 2016-03-25 – 2016-03-29 (×5): 1 via ORAL
  Filled 2016-03-25 (×5): qty 1

## 2016-03-25 MED ORDER — POTASSIUM CHLORIDE CRYS ER 20 MEQ PO TBCR
40.0000 meq | EXTENDED_RELEASE_TABLET | Freq: Two times a day (BID) | ORAL | Status: AC
  Administered 2016-03-25 (×2): 40 meq via ORAL
  Filled 2016-03-25 (×2): qty 2

## 2016-03-25 MED ORDER — ACETAMINOPHEN 325 MG PO TABS
650.0000 mg | ORAL_TABLET | Freq: Four times a day (QID) | ORAL | Status: DC | PRN
  Administered 2016-03-25 – 2016-03-26 (×3): 650 mg via ORAL
  Filled 2016-03-25 (×3): qty 2

## 2016-03-25 MED ORDER — IPRATROPIUM-ALBUTEROL 0.5-2.5 (3) MG/3ML IN SOLN
3.0000 mL | Freq: Four times a day (QID) | RESPIRATORY_TRACT | Status: DC
  Filled 2016-03-25: qty 3

## 2016-03-25 NOTE — Progress Notes (Signed)
PULMONARY / CRITICAL CARE MEDICINE   Name: [name] MRN: [id number] DOB: 1941/02/22    ADMISSION DATE:  03/23/2016 CONSULTATION DATE:  03/23/2016  REFERRING MD:  FPTS Dr. Pollie Meyer   CHIEF COMPLAINT:  SOB  HISTORY OF PRESENT ILLNESS:  Patient is encephalopathic and/or intubated. Therefore history has been obtained from chart review. 76 year old female with dementia (oriented to self only at baseline), DM, HTN, MAT, PAH, and she is a SNF resident. She presented to [facility] ED from SNF 1/3 with complaints of dyspnea x 4 days. Patient is not able to participate much in history taking. She was sent to ED for evaluation given her history of PE, currently treated with Xarelto. Also complaints of I large episode diarrhea. Workup in ED consisted of CXR and CT angiogram with no clear causes of dyspnea. There was some concern for sepsis due to elevated lactic acid so she was started on broad spectrum antibiotics. ABG revealed respiratory acidosis and she was started on BiPAP which did relieve this some within about an hour. KUB demonstrated ileus. She was admitted to the family practice service and PCCM has been asked to see for further evaluation.  SUBJECTIVE:  Feels that her breathing is better. Some cough still  VITAL SIGNS: BP (!) 164/85 (BP Location: Right Arm)   Pulse 88   Temp 97.8 F (36.6 C) (Oral)   Resp 18   Wt 180 lb 12.4 oz (82 kg)   SpO2 97%   BMI 35.31 kg/m   INTAKE / OUTPUT: I/O last 3 completed shifts: In: 62 [IV Piggyback:3950] Out: -   PHYSICAL EXAMINATION: General:  Elderly female in NAD  Neuro:  Spontaneously awake, alert. Oriented to self and president only.  HEENT:  [place]/AT, PERRL, no JVD Cardiovascular:  RRR, no MRG Lungs:  Rhoncherous.  Abdomen:  Soft, mild distension, non-tender Musculoskeletal:  No acute deformity Skin: Grossly intact  LABS:  BMET  Recent Labs Lab 03/24/16 1042 03/24/16 1750 03/25/16 0742  NA 145 146* 145  K 2.7* 2.9* 3.2*  CL 112* 110  109  CO2 24 27 27   BUN 18 16 13   CREATININE 0.57 0.57 0.53  GLUCOSE 123* 120* 80    Electrolytes  Recent Labs Lab 03/23/16 1447  03/24/16 1042 03/24/16 1750 03/25/16 0742  CALCIUM 9.4  < > 8.6* 9.0 8.9  MG 1.9  --   --  1.6*  --   < > = values in this interval not displayed.  CBC  Recent Labs Lab 03/23/16 1447 03/24/16 0253 03/25/16 0742  WBC 9.9 5.9 9.9  HGB 12.4 10.8* 11.0*  HCT 40.0 34.0* 34.4*  PLT 340 241 285    Coag's  Recent Labs Lab 03/23/16 1447  INR 0.94    Sepsis Markers  Recent Labs Lab 03/23/16 2352 03/24/16 0253 03/24/16 0748  LATICACIDVEN 3.6* 2.4* 2.0*    ABG  Recent Labs Lab 03/23/16 2104 03/23/16 2232 03/24/16 0848  PHART 7.182* 7.267* 7.376  PCO2ART 62.1* 49.7* 43.7  PO2ART 96.0 208.0* 162.0*    Liver Enzymes  Recent Labs Lab 03/23/16 1447 03/24/16 0253  AST 42* 37  ALT 21 21  ALKPHOS 94 67  BILITOT 0.3 0.3  ALBUMIN 2.7* 2.3*    Cardiac Enzymes  Recent Labs Lab 03/23/16 2117 03/24/16 0253 03/24/16 0748  TROPONINI 0.03* <0.03 <0.03    Glucose  Recent Labs Lab 03/24/16 2319 03/25/16 0402 03/25/16 0737 03/25/16 1203  GLUCAP 110* 86 86 75    Imaging No results  found.   STUDIES:  CTA chest 1/3 > Negative for pulmonary embolus or acute disease. Emboli seen on the comparison examination are normal no longer visualized. Cardiomegaly. Findings compatible pulmonary trunk hypertension. 4.1 cm ascending thoracic aorta, unchanged.  KUB 1/3 > Bowel gas pattern most in keeping with ileus. 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

## 2016-03-25 NOTE — Progress Notes (Signed)
Initial Nutrition Assessment  DOCUMENTATION CODES:   Obesity unspecified  INTERVENTION:   -Boost Breeze po TID, each supplement provides 250 kcal and 9 grams of protein -MVI daily  NUTRITION DIAGNOSIS:   Increased nutrient needs related to wound healing as evidenced by estimated needs.  GOAL:   Patient will meet greater than or equal to 90% of their needs  MONITOR:   PO intake, Supplement acceptance, Diet advancement, Labs, Weight trends, Skin, I & O's  REASON FOR ASSESSMENT:   Low Braden    ASSESSMENT:   [name] presenting to the ED with respiratory distress.   Pt admitted with acute respiratory failure with hypoxia.   Case discussed with RN prior to visit, who reports NGT placement was d/c (ordered on 03/23/16, but unable to tolerate placement on 03/25/15). Per RN, pt was just advanced to a clear liquid diet and is tolerating medication administration well.   Pt unable to provide additional hx related to cognitive deficit. Unable to perform nutrition-focused physical exam at this time, as pt was undergoing echo at time of visit. However, pt did not appear to have signs of fat or muscle depletion in orbital temple, dorsal hand, upper arm, or clavicle areas.   Noted wt has been stable for > 6 months, with distant hx of weight gain.   Pt with multiple pressure injuries; awaiting CWOCN consult. Pt with increased nutrient needs related to wound healing; RD will add supplements.   Labs reviewed: K: 3.2 (on PO supplementation), CBGS: 86-110.   Diet Order:  Diet clear liquid Room service appropriate? Yes; Fluid consistency: Thin  Skin:  Wound (see comment) (DTI rt heel, st II sacrum, UN rt heel, lt ankle, rt foot)  Last BM:  03/24/16  Height:   Ht Readings from Last 1 Encounters:  01/12/16 5' (1.524 m)    Weight:   Wt Readings from Last 1 Encounters:  03/23/16 180 lb 12.4 oz (82 kg)    Ideal Body Weight:  45.5 kg  BMI:  Body mass  index is 35.31 kg/m.  Estimated Nutritional Needs:   Kcal:  1650-1850  Protein:  80-95 grams  Fluid:  >1.6 L  EDUCATION NEEDS:   Education needs addressed  [name], RD, LDN, CDE Pager: [phone] After hours Pager: [phone]

## 2016-03-25 NOTE — Consult Note (Signed)
WOC Nurse wound consult note Reason for Consult: multiple pressure injuries [facility]  Wound type: 1.  Unstageable pressure injury right medial heel: 4cm x 3.5cm x 0.1cm; 100% eschar with some scant drainage along the lateral wound edge, not fluctuant 2.  Unstageable pressure injury right medial malleolus: 1cm x 1cm x 0.2cm: 100% yellow; no drainage 3.  Unstageable pressure injury left lateral distal foot; 2cm x 3cm x 0.1cm; 100% dark centrally but not eschar with macerated wound edges; not fluctuant, scant serosanguinous drainage. 4.  Unstageable pressure injury left medial malleolus: 1cm x 1.5cm x 0.1cm: 100% yellow, minimal yellow drainage with no odor 5. Sacrum: 0.1cm x 0.1cm x 0.1cm opening   Pressure Ulcer POA: Yes x 5 Measurement:see above  Wound bed: see above Drainage (amount, consistency, odor) see above Periwound: intact  Dressing procedure/placement/frequency: 1. Paint right medial heel ulcer and left lateral foot ulcer with betadine swab, allow to air dry. Protect with silicone foam. Use Prevalon boots to offload pressure at all times. 2. Apply 1/4" thick layer of Santyl to the right medial malleolar and left medial malleolar wound daily, cover with dry dressing (not foam). 3. Silicone foam to the sacrum and upper gluteal fold, change every 3 days and PRN soilage.  Patient is noted to be incontinent of bowel and bladder, I have added barrier cream at least BID to protect her skin from MASD.  The LALM will also help with this.   This WOC nurse ordered Prevalon boots and low air loss mattress in the ED yesterday for pressure redistribution for the heels and for the sacrum.  Orders not completed when I arrived on the floor today.  I have requested the Prevalon boots again today.  I have also requested the patient be transferred onto a low air loss mattress today.   No family at bedside to update.  Discussed POC with patient and bedside nurse.   Re consult if needed, will not follow at this time. Thanks  Lebron Nauert M[employer] MSN, RN,CWOCN, CNS [phone])

## 2016-03-25 NOTE — Progress Notes (Signed)
  Echocardiogram 2D Echocardiogram has been performed.  [name] 03/25/2016, 11:25 AM

## 2016-03-25 NOTE — Progress Notes (Signed)
Family Medicine Teaching Service Daily Progress Note Intern Pager: [phone]  Patient name: Gita KudoMina S Klebba Medical record number: [id number] Date of birth: 09/20/1940 Age: 76 y.o. Gender: female  Primary Care Provider: Rodrigo Ranrystal Dorsey, MD Consultants: CCM Code Status: full  Pt Overview and Major Events to Date:  01/03: admit for increased WOB and diarrhea meeting SIRS criteria 01/04: tested positive for flu  Assessment and Plan: Gita KudoMina S Nordmeyer is a 76 y.o. female presenting with shortness of breath. PMH is significant for multifocal atrial tachycardia, h/o PE (12/2015), Thoracic Aortic Aneurysm, Pulmonary Hypertension, Anxiety, diabetes, hypertension, hyperlipidemia.   #Acute Respiratory Failure with Hypoxia meeting SIRS, Acute, Imporved: concerning for sepsis. [facility]. Initially needed BiPAP but able to wean to [place] 4 L, baseline is Room Air. Weight stable, 180 lbs here and in 08/2015. BNP slightly elevated to 190.9, no prior for comparison. Troponin negative. Lactic Acid 3.32, trending down. WBC wnl at 9.9. CXR without acute pathology, no signs of pleural edema. CTA chest without PE, thoracic aortic aneurysm stable. Meets SIRS criteria, qSOFA 2. Suspected pulmonary HTN given evidence of pulmonary artery enlargement on CTA. History of PE in 12/2015, currently on Xarelto. Bilateral Pulmonary Nodules noted, patient declining further imaging since she does not want aggressive treatments if it is cancer. Last ECHO from 02/2015 with EF 60-65%, difficult study. Received 2.5L NS bolus in ED. Received dose of Vancomycin and Cefepime in ED, but d/c given tested pos for flu.  --Telemetry --Continouos Pulse Ox.  --O2 as needed to keep O2 saturation >92%, off BiPAP, on [place] 3 L --Follow up Blood Cultures, Urine Culture repeats --Repeat ECHO --Duonebs scheduled q6hr, albuterol q4h PRN --D/c Vancomycin and Zosyn (01/03>01/05) given flu positive  #Diarrhea with Ileus, Acute,  Stable: Noted by Nursing. Notes diffuse mild abdominal pain. Denies fever or vomiting. Initial KUB c/w ileus w/o obstruction. Repeat showed decreased gas distention. Multiple stools since admission per RN. --F/u c-diff and GI panel --Will d/c Vancomycin, Zosyn per above --NS @100cc /hr  #Influenza Positive, Acute, Stable: likely source for diarrhea and SOB. Will d/c broad spectrum abx and f/u BCx repeats given last was likely contaminant. --Tamiflu  #Lower Extremity Pressure Ulcers, Chronic, Stable: --Consider WOC consult --Tylenol and Ibuprofen PRN pain --Prevalon boot  #Hypokalemia, Acute, Worsening: Potassium 2.9 at admission, now 3.2. KCl Multirun given in ED, will replete as needed. Likely due to diarrhea. --Follow up BMP in AM --Repletion is necessary   #Hypertension, Chronic, Stable: Currently on Carvedilol 3.125 mg twice daily. Goal <130/80 given history of thoracic ascending aneurysm.  --Monitor BP --Continue home Carvedilol  #Diabetes Mellitus, Chronic, Stable: Not currently on medication. Glucose 153 at admission. --Monitor glucose on BMP --Consider sliding scale  #Anxiety, Chronic, Stable: Medications include Xanax 0.25mg  q8hr as needed. --Holding home medication  #Pressure Ulcers: Noted on left lateral heel and right lateral forefoot --Continue to monitor  #Mild to Moderate Dementia: History of incontinence, wearing adult diapers at baseline. Attention is poor at baseline. Usually oriented, however did not cooperate with orientation questions at admission. --High risk of Delirium. Monitor.  #History of PE: Home medication includes Rivaroxaban with plan to continue for 3 months, to be evaluated if further treatment is needed in 04/2016. --Continue home Xarelto  #Thoracic Ascending Aneurysm: Unchanged and stable. Goal BP<130/80. --Monitor BP   FEN/GI: clear liquid diet, advance as tolerated  PPx: Xarelto  Disposition: pending improvement of respiratory  status, [facility]  Subjective:  Patient laying in bed  stating she feels better but continues to have some SOB. Denies nausea. Says she has been having diarrhea though RN denies this.   Objective: Temp:  [97.5 F (36.4 C)-97.8 F (36.6 C)] 97.7 F (36.5 C) (01/05 0300) Pulse Rate:  [67-93] 67 (01/04 2300) Resp:  [17-35] 23 (01/05 0300) BP: (137-172)/(73-100) 160/100 (01/05 0300) SpO2:  [96 %-100 %] 99 % (01/04 2300) FiO2 (%):  [50 %] 50 % (01/04 0829) Physical Exam: General: well nourished, well developed, in mild acute respiratory distress with non-toxic appearance HEENT: normocephalic, atraumatic, moist mucous membranes Neck: supple, non-tender without lymphadenopathy CV: regular rate and rhythm without murmurs, rubs, or gallops Lungs: diffuse rhonchi and coarse breath sounds without wheeze bilaterally with no increased work of breathing on RA Abdomen: soft, mildly distended, minimally diffusely tender without rebound or guarding, no masses or organomegaly palpable, normoactive bowel sounds present Skin: warm, dry, no rashes, cap refill < 2 seconds Extremities: warm and well perfused, normal tone, non-edematous, two ulcers with dressings on right heel and left medial malleolus  Laboratory: BCx (01/03): MRSA x1 of 2 Cx, c/w probable contaminant BCx (01/04): pending UCx: NGTD Flu: positive ABG: 7.182>7.267>7.376 Lactic acid: 3.6>2.4>2.0 Troponin: negative BNP: 190.0 GI panel: pending C-diff: negative  Recent Labs Lab 03/23/16 1447 03/24/16 0253  WBC 9.9 5.9  HGB 12.4 10.8*  HCT 40.0 34.0*  PLT 340 241    Recent Labs Lab 03/23/16 1447 03/24/16 0253 03/24/16 1042 03/24/16 1750  NA 146* 146* 145 146*  K 2.8* 2.5* 2.7* 2.9*  CL 110 116* 112* 110  CO2 23 19* 24 27  BUN 29* 22* 18 16  CREATININE 0.66 0.57 0.57 0.57  CALCIUM 9.4 8.2* 8.6* 9.0  PROT 6.6 5.7*  --   --   BILITOT 0.3 0.3  --   --   ALKPHOS 94 67  --   --   ALT 21 21  --   --    AST 42* 37  --   --   GLUCOSE 153* 161* 123* 120*    Imaging/Diagnostic Tests: DG Abd Portable 1 View (03/24/2016) FINDINGS: Decreased gaseous distention of bowel loops throughout the abdomen. No bowel wall thickening or evidence of obstruction. Excreted contrast within urinary bladder. Atherosclerotic calcification aorta and iliac arteries bilaterally. Bones demineralized.  IMPRESSION: Decreased gaseous distention of bowel since previous exam. Aortic atherosclerosis.  Abd 1 View (KUB) (03/23/2016) FINDINGS: There is mild gaseous distention of small and large bowel. Contrast in the urinary bladder from chest CT earlier today is noted. Atherosclerotic vascular disease is seen.  IMPRESSION: Bowel gas pattern most in keeping with ileus.  CT Angio Chest PE W and/or Wo Contrast (03/23/2016) FINDINGS: Cardiovascular: Previously seen pulmonary emboli are no longer visualized. There is cardiomegaly. Enlarged pulmonary outflow tract consistent with pulmonary arterial hypertension is again seen. 4.1 cm in diameter ascending thoracic aorta is also again seen.  Mediastinum/Nodes: No enlarged mediastinal, hilar, or axillary lymph nodes. Thyroid gland, trachea, and esophagus demonstrate no significant findings.  Lungs/Pleura: No pleural effusion. Scattered bilateral subcentimeter pulmonary nodules are again seen. Some of these measure slightly smaller including a right middle lobe nodule on image 85 measuring 0.6 cm in diameter compared to 0.8 on the prior exam. Nodule in the lingula which had measured 0.6 cm measures 0.5 cm on image 86. No new or enlarging nodules identified. Mild scattered ground-glass attenuation bilaterally is seen.  Upper Abdomen: Calcifications in the spleen and liver are consistent with old granulomatous disease.  Musculoskeletal: Osteopenia multilevel  thoracic spondylosis are noted. No lytic or sclerotic lesion.  Review of the MIP images confirms the  above findings.  IMPRESSION: Negative for pulmonary embolus or acute disease. Emboli seen on the comparison examination are normal no longer visualized.  Cardiomegaly.  Findings compatible pulmonary trunk hypertension.  4.1 cm ascending thoracic aorta, unchanged. Recommend annual imaging followup by CTA or MRA. This recommendation follows 2010 ACCF/AHA/AATS/ACR/ASA/SCA/SCAI/SIR/STS/SVM Guidelines for the Diagnosis and Management of Patients with Thoracic Aortic Disease. Circulation. 2010; 121: U045-W098.  No change to slight decrease in the size of multiple bilateral pulmonary nodules. No new or enlarging nodule is identified.  DG Chest Portable 1 View (03/23/2016) FINDINGS: There is cardiomegaly without edema. No pneumothorax or pleural effusion. Calcified granuloma in the right mid lung is again seen. No bony abnormality. Aortic atherosclerosis is noted.  IMPRESSION: No acute disease.    Wendee Beavers, DO 03/25/2016, 7:24 AM PGY-1, [facility] FPTS Intern pager: [phone], text pages welcome

## 2016-03-26 DIAGNOSIS — A0472 Enterocolitis due to Clostridium difficile, not specified as recurrent: Secondary | ICD-10-CM

## 2016-03-26 DIAGNOSIS — J111 Influenza due to unidentified influenza virus with other respiratory manifestations: Secondary | ICD-10-CM

## 2016-03-26 LAB — CBC
HCT: 33 % — ABNORMAL LOW (ref 36.0–46.0)
Hemoglobin: 10.3 g/dL — ABNORMAL LOW (ref 12.0–15.0)
MCH: 29.3 pg (ref 26.0–34.0)
MCHC: 31.2 g/dL (ref 30.0–36.0)
MCV: 93.8 fL (ref 78.0–100.0)
PLATELETS: 303 10*3/uL (ref 150–400)
RBC: 3.52 MIL/uL — AB (ref 3.87–5.11)
RDW: 14.7 % (ref 11.5–15.5)
WBC: 10 10*3/uL (ref 4.0–10.5)

## 2016-03-26 LAB — BASIC METABOLIC PANEL
ANION GAP: 8 (ref 5–15)
BUN: 10 mg/dL (ref 6–20)
CALCIUM: 8.5 mg/dL — AB (ref 8.9–10.3)
CO2: 29 mmol/L (ref 22–32)
Chloride: 104 mmol/L (ref 101–111)
Creatinine, Ser: 0.44 mg/dL (ref 0.44–1.00)
GFR calc Af Amer: >60 mL/min (ref >60)
GLUCOSE: 76 mg/dL (ref 65–99)
POTASSIUM: 3.7 mmol/L (ref 3.5–5.1)
SODIUM: 141 mmol/L (ref 135–145)

## 2016-03-26 LAB — GASTROINTESTINAL PANEL BY PCR, STOOL (REPLACES STOOL CULTURE)
ASTROVIRUS: NOT DETECTED
Adenovirus F40/41: NOT DETECTED
CYCLOSPORA CAYETANENSIS: NOT DETECTED
Campylobacter species: NOT DETECTED
Cryptosporidium: NOT DETECTED
ENTAMOEBA HISTOLYTICA: NOT DETECTED
ENTEROAGGREGATIVE E COLI (EAEC): NOT DETECTED
ENTEROTOXIGENIC E COLI (ETEC): NOT DETECTED
Enteropathogenic E coli (EPEC): NOT DETECTED
GIARDIA LAMBLIA: NOT DETECTED
NOROVIRUS GI/GII: DETECTED — AB
Plesimonas shigelloides: NOT DETECTED
Rotavirus A: NOT DETECTED
SALMONELLA SPECIES: NOT DETECTED
Sapovirus (I, II, IV, and V): NOT DETECTED
Shiga like toxin producing E coli (STEC): NOT DETECTED
Shigella/Enteroinvasive E coli (EIEC): NOT DETECTED
VIBRIO CHOLERAE: NOT DETECTED
VIBRIO SPECIES: NOT DETECTED
Yersinia enterocolitica: NOT DETECTED

## 2016-03-26 LAB — GLUCOSE, CAPILLARY
GLUCOSE-CAPILLARY: 80 mg/dL (ref 65–99)
GLUCOSE-CAPILLARY: 94 mg/dL (ref 65–99)
Glucose-Capillary: 100 mg/dL — ABNORMAL HIGH (ref 65–99)
Glucose-Capillary: 100 mg/dL — ABNORMAL HIGH (ref 65–99)
Glucose-Capillary: 83 mg/dL (ref 65–99)
Glucose-Capillary: 95 mg/dL (ref 65–99)

## 2016-03-26 LAB — C DIFFICILE QUICK SCREEN W PCR REFLEX
C DIFFICILE (CDIFF) TOXIN: NEGATIVE
C Diff antigen: POSITIVE — AB

## 2016-03-26 LAB — CLOSTRIDIUM DIFFICILE BY PCR: Toxigenic C. Difficile by PCR: POSITIVE — AB

## 2016-03-26 MED ORDER — INSULIN ASPART 100 UNIT/ML ~~LOC~~ SOLN
0.0000 [IU] | Freq: Three times a day (TID) | SUBCUTANEOUS | Status: DC
  Administered 2016-03-27: 1 [IU] via SUBCUTANEOUS

## 2016-03-26 MED ORDER — VANCOMYCIN 50 MG/ML ORAL SOLUTION
125.0000 mg | Freq: Four times a day (QID) | ORAL | Status: DC
  Administered 2016-03-26 – 2016-03-27 (×5): 125 mg via ORAL
  Filled 2016-03-26 (×7): qty 2.5

## 2016-03-26 NOTE — Progress Notes (Signed)
Family Medicine Teaching Service Daily Progress Note Intern Pager: [phone]  Patient name: [name] Medical record number: [id number] Date of birth: 29-Dec-1940 Age: 76 y.o. Gender: female  Primary Care Provider: Rodrigo Ran, MD Consultants: CCM Code Status: full  Pt Overview and Major Events to Date:  01/03: admit for increased WOB and diarrhea meeting SIRS criteria 01/04: tested positive for flu  Assessment and Plan: [name] is a 76 y.o. female presenting with shortness of breath. PMH is significant for multifocal atrial tachycardia, h/o PE (12/2015), Thoracic Aortic Aneurysm, Pulmonary Hypertension, Anxiety, diabetes, hypertension, hyperlipidemia.   #Acute Respiratory Failure with Hypoxia meeting SIRS, Acute, Imporved: concerning for sepsis. Arrived from [facility] with increased WOB. Initially needed BiPAP but able to wean to [place] 4 L, baseline is Room Air. Weight stable, 180 lbs here and in 08/2015. BNP slightly elevated to 190.9, no prior for comparison. Troponin negative. Lactic Acid 3.32, trending down. WBC wnl at 9.9. CXR without acute pathology, no signs of pleural edema. CTA chest without PE, thoracic aortic aneurysm stable. Meets SIRS criteria, qSOFA 2. Suspected pulmonary HTN given evidence of pulmonary artery enlargement on CTA. History of PE in 12/2015, currently on Xarelto. Bilateral Pulmonary Nodules noted, patient declining further imaging since she does not want aggressive treatments if it is cancer. ECHO c/w EF 55-60%, no wall motion abn, G1DD. Received dose of Vancomycin and Cefepime in ED, but d/c given tested pos for flu. BCx initial with x1 pos coag neg staph, other neg. Repeat BCx NGTD, UCX NGTD.  --Telemetry --Continouos Pulse Ox.  --O2 as needed to keep O2 saturation >92%, off BiPAP, on RA --Follow up Blood Cultures, Urine Culture repeats --Duonebs scheduled q6hr, albuterol q4h PRN --D/c Vancomycin and Zosyn (01/03>01/05) given flu positive  #Diarrhea with  Ileus, Acute, Stable: Noted by Nursing. Notes diffuse mild abdominal pain. Denies fever or vomiting. Initial KUB c/w ileus w/o obstruction. Repeat showed decreased gas distention. Multiple stools since admission per RN. C-diff antigen pos but neg toxin, PCR pending. --F/u c-diff PCR and GI panel --NS @100cc /hr  #Influenza Positive, Acute, Stable: likely source for diarrhea and SOB. Will d/c broad spectrum abx and f/u BCx repeats given last was likely contaminant. --Tamiflu  #Lower Extremity Pressure Ulcers, Chronic, Stable: --Consider WOC consult --Tylenol and Ibuprofen PRN pain --Prevalon boot  #Hypokalemia, Acute, Worsening: Potassium 2.9 at admission, now 3.2. KCl Multirun given in ED, will replete as needed. Likely due to diarrhea. --Follow up BMP in AM --Repletion is necessary   #Hypertension, Chronic, Stable: Currently on Carvedilol 3.125 mg twice daily. Goal <130/80 given history of thoracic ascending aneurysm.  --Monitor BP --Continue home Carvedilol  #Diabetes Mellitus, Chronic, Stable: Not currently on medication. Glucose 153 at admission. --Monitor glucose on BMP --Consider sliding scale  #Anxiety, Chronic, Stable: Medications include Xanax 0.25mg  q8hr as needed. --Holding home medication  #Pressure Ulcers: Noted on left lateral heel and right lateral forefoot --Continue to monitor  #Mild to Moderate Dementia: History of incontinence, wearing adult diapers at baseline. Attention is poor at baseline. Usually oriented, however did not cooperate with orientation questions at admission. --High risk of Delirium. Monitor.  #History of PE: Home medication includes Rivaroxaban with plan to continue for 3 months, to be evaluated if further treatment is needed in 04/2016. --Continue home Xarelto  #Thoracic Ascending Aneurysm: Unchanged and stable. Goal BP<130/80. --Monitor BP   FEN/GI: clear liquid diet, advance as tolerated  PPx: Xarelto  Disposition: pending  improvement of respiratory status, anticipate d/c back to  [facility]  Subjective:  Patient laying in bed stating she does not want me to leave. She says she has no pain but is unable to give any more history given demented state.   Objective: Temp:  [97.6 F (36.4 C)-98.4 F (36.9 C)] 97.6 F (36.4 C) (01/06 0329) Pulse Rate:  [76-96] 96 (01/06 0746) Resp:  [12-41] 41 (01/06 0746) BP: (129-188)/(80-164) 168/99 (01/06 0746) SpO2:  [91 %-100 %] 95 % (01/06 0746) Physical Exam: General: well nourished, well developed, confused with non-toxic appearance HEENT: normocephalic, atraumatic, moist mucous membranes CV: regular rate and rhythm without murmurs, rubs, or gallops Lungs: diffuse rhonchi and coarse breath sounds without wheeze bilaterally with no increased work of breathing on RA Abdomen: soft, minimally distended, minimally diffusely tender without rebound or guarding, no masses or organomegaly palpable, normoactive bowel sounds present Skin: warm, dry, no rashes, cap refill < 2 seconds Extremities: warm and well perfused, normal tone, non-edematous, two ulcers with dressings on right heel and left medial malleolus  Laboratory: BCx (01/03): MRSA x1 of 2 Cx, c/w probable contaminant BCx (01/04): pending UCx: NGTD Flu: positive ABG: 7.182>7.267>7.376 Lactic acid: 3.6>2.4>2.0 Troponin: negative BNP: 190.0 GI panel: pending C-diff: pos antigen, neg toxin C-diff PCR: pending  Recent Labs Lab 03/24/16 0253 03/25/16 0742 03/26/16 0208  WBC 5.9 9.9 10.0  HGB 10.8* 11.0* 10.3*  HCT 34.0* 34.4* 33.0*  PLT 241 285 303    Recent Labs Lab 03/23/16 1447 03/24/16 0253  03/24/16 1750 03/25/16 0742 03/26/16 0208  NA 146* 146*  < > 146* 145 141  K 2.8* 2.5*  < > 2.9* 3.2* 3.7  CL 110 116*  < > 110 109 104  CO2 23 19*  < > 27 27 29   BUN 29* 22*  < > 16 13 10   CREATININE 0.66 0.57  < > 0.57 0.53 0.44  CALCIUM 9.4 8.2*  < > 9.0 8.9 8.5*  PROT 6.6 5.7*  --   --   --   --    BILITOT 0.3 0.3  --   --   --   --   ALKPHOS 94 67  --   --   --   --   ALT 21 21  --   --   --   --   AST 42* 37  --   --   --   --   GLUCOSE 153* 161*  < > 120* 80 76  < > = values in this interval not displayed.  Imaging/Diagnostic Tests: DG Abd Portable 1 View (03/24/2016) FINDINGS: Decreased gaseous distention of bowel loops throughout the abdomen. No bowel wall thickening or evidence of obstruction. Excreted contrast within urinary bladder. Atherosclerotic calcification aorta and iliac arteries bilaterally. Bones demineralized.  IMPRESSION: Decreased gaseous distention of bowel since previous exam. Aortic atherosclerosis.  Abd 1 View (KUB) (03/23/2016) FINDINGS: There is mild gaseous distention of small and large bowel. Contrast in the urinary bladder from chest CT earlier today is noted. Atherosclerotic vascular disease is seen.  IMPRESSION: Bowel gas pattern most in keeping with ileus.  CT Angio Chest PE W and/or Wo Contrast (03/23/2016) FINDINGS: Cardiovascular: Previously seen pulmonary emboli are no longer visualized. There is cardiomegaly. Enlarged pulmonary outflow tract consistent with pulmonary arterial hypertension is again seen. 4.1 cm in diameter ascending thoracic aorta is also again seen.  Mediastinum/Nodes: No enlarged mediastinal, hilar, or axillary lymph nodes. Thyroid gland, trachea, and esophagus demonstrate no significant findings.  Lungs/Pleura: No pleural effusion. Scattered bilateral subcentimeter pulmonary  nodules are again seen. Some of these measure slightly smaller including a right middle lobe nodule on image 85 measuring 0.6 cm in diameter compared to 0.8 on the prior exam. Nodule in the lingula which had measured 0.6 cm measures 0.5 cm on image 86. No new or enlarging nodules identified. Mild scattered ground-glass attenuation bilaterally is seen.  Upper Abdomen: Calcifications in the spleen and liver are consistent with old  granulomatous disease.  Musculoskeletal: Osteopenia multilevel thoracic spondylosis are noted. No lytic or sclerotic lesion.  Review of the MIP images confirms the above findings.  IMPRESSION: Negative for pulmonary embolus or acute disease. Emboli seen on the comparison examination are normal no longer visualized.  Cardiomegaly.  Findings compatible pulmonary trunk hypertension.  4.1 cm ascending thoracic aorta, unchanged. Recommend annual imaging followup by CTA or MRA. This recommendation follows 2010 ACCF/AHA/AATS/ACR/ASA/SCA/SCAI/SIR/STS/SVM Guidelines for the Diagnosis and Management of Patients with Thoracic Aortic Disease. Circulation. 2010; 121: Z610-R604.  No change to slight decrease in the size of multiple bilateral pulmonary nodules. No new or enlarging nodule is identified.  DG Chest Portable 1 View (03/23/2016) FINDINGS: There is cardiomegaly without edema. No pneumothorax or pleural effusion. Calcified granuloma in the right mid lung is again seen. No bony abnormality. Aortic atherosclerosis is noted.  IMPRESSION: No acute disease.    Wendee Beavers, DO 03/26/2016, 8:14 AM PGY-1, [facility] Family Medicine FPTS Intern pager: [phone], text pages welcome

## 2016-03-26 NOTE — Progress Notes (Signed)
PULMONARY / CRITICAL CARE MEDICINE   Name: [name] MRN: [id number] DOB: 12/20/1940    ADMISSION DATE:  03/23/2016 CONSULTATION DATE:  03/23/2016  REFERRING MD:  FPTS Dr. Pollie MeyerMcIntyre   CHIEF COMPLAINT:  SOB  HISTORY OF PRESENT ILLNESS:  Patient is encephalopathic and/or intubated. Therefore history has been obtained from chart review. 76 year old female with dementia (oriented to self only at baseline), DM, HTN, MAT, PAH, and she is a SNF resident. [facility] with complaints of dyspnea x 4 days. Patient is not able to participate much in history taking. She was sent to ED for evaluation given her history of PE, currently treated with Xarelto. Also complaints of I large episode diarrhea. Workup in ED consisted of CXR and CT angiogram with no clear causes of dyspnea. There was some concern for sepsis due to elevated lactic acid so she was started on broad spectrum antibiotics. ABG revealed respiratory acidosis and she was started on BiPAP which did relieve this some within about an hour. KUB demonstrated ileus. She was admitted to the family practice service and PCCM has been asked to see for further evaluation.  SUBJECTIVE:  C/o cough  No CP, dyspnea better  VITAL SIGNS: BP 131/78 (BP Location: Left Arm)   Pulse 88   Temp 97.7 F (36.5 C) (Oral)   Resp (!) 37   Wt 180 lb 12.4 oz (82 kg)   SpO2 96%   BMI 35.31 kg/m   INTAKE / OUTPUT: I/O last 3 completed shifts: In: 200 [IV Piggyback:200] Out: -   PHYSICAL EXAMINATION: General:  Elderly female in NAD  Neuro:  Spontaneously awake, alert. Oriented to self and place only.  HEENT:  [place]/AT, PERRL, no JVD Cardiovascular:  RRR, no MRG Lungs:  Rhoncherous.  Abdomen:  Soft, mild distension, non-tender Musculoskeletal:  No acute deformity Skin: Grossly intact  LABS:  BMET  Recent Labs Lab 03/24/16 1750 03/25/16 0742 03/26/16 0208  NA 146* 145 141  K 2.9* 3.2* 3.7  CL 110 109 104  CO2 27 27 29   BUN 16  13 10   CREATININE 0.57 0.53 0.44  GLUCOSE 120* 80 76    Electrolytes  Recent Labs Lab 03/23/16 1447  03/24/16 1750 03/25/16 0742 03/26/16 0208  CALCIUM 9.4  < > 9.0 8.9 8.5*  MG 1.9  --  1.6*  --   --   < > = values in this interval not displayed.  CBC  Recent Labs Lab 03/24/16 0253 03/25/16 0742 03/26/16 0208  WBC 5.9 9.9 10.0  HGB 10.8* 11.0* 10.3*  HCT 34.0* 34.4* 33.0*  PLT 241 285 303    Coag's  Recent Labs Lab 03/23/16 1447  INR 0.94    Sepsis Markers  Recent Labs Lab 03/23/16 2352 03/24/16 0253 03/24/16 0748  LATICACIDVEN 3.6* 2.4* 2.0*    ABG  Recent Labs Lab 03/23/16 2104 03/23/16 2232 03/24/16 0848  PHART 7.182* 7.267* 7.376  PCO2ART 62.1* 49.7* 43.7  PO2ART 96.0 208.0* 162.0*    Liver Enzymes  Recent Labs Lab 03/23/16 1447 03/24/16 0253  AST 42* 37  ALT 21 21  ALKPHOS 94 67  BILITOT 0.3 0.3  ALBUMIN 2.7* 2.3*    Cardiac Enzymes  Recent Labs Lab 03/23/16 2117 03/24/16 0253 03/24/16 0748  TROPONINI 0.03* <0.03 <0.03    Glucose  Recent Labs Lab 03/25/16 1203 03/25/16 1616 03/25/16 1944 03/26/16 0001 03/26/16 0332 03/26/16 0837  GLUCAP 75 117* 93 83 100* 80  Imaging No results found.   STUDIES:  CTA chest 1/3 > Negative for pulmonary embolus or acute disease. Emboli seen on the comparison examination are normal no longer visualized. Cardiomegaly. Findings compatible pulmonary trunk hypertension. 4.1 cm ascending thoracic aorta, unchanged.  KUB 1/3 > Bowel gas pattern most in keeping with ileus. ECHO 1/5 > 55-60% LV EF, grade 1 dd  CULTURES: BCx2 1/3 > 1 of 2 bottles coag neg staph Urine 1/3 > >100,000 colonies/ml aerococcus urinae Flu panel influenza A positive Cdiff ag +, toxin neg, pcr +  ANTIBIOTICS: Zosyn 1/3 > 1/5 Vancomycin 1/3 > 1/5 Tamiflu 1/4 >  ASSESSMENT / PLAN:  Acute hypercarbic respiratory failure >> Resolved Influenza A -  supplemental oxygen prn - Continue  Tamiflu  Ileus/Diarrhea - c diff antigen +, toxin neg, pCR + Per guideline , this indicates -Positive for toxigenic C. difficile with little to no toxin production. Only treat if clinical presentation suggests symptomatic illness -would treat since symptomatic although leucocytosis better, PO vanc x 14 ds - on a dysphagia diet  PAH - No edema noted on CXR/CT  History of PE - continue home Xarelto  DM - per primary   PCCm available as needed  Cyril Mourning MD. FCCP. [facility] Pager [phone] If no response call [phone]    03/26/2016 11:43 AM

## 2016-03-27 DIAGNOSIS — A0811 Acute gastroenteropathy due to Norwalk agent: Secondary | ICD-10-CM

## 2016-03-27 LAB — GLUCOSE, CAPILLARY
GLUCOSE-CAPILLARY: 133 mg/dL — AB (ref 65–99)
Glucose-Capillary: 85 mg/dL (ref 65–99)
Glucose-Capillary: 86 mg/dL (ref 65–99)
Glucose-Capillary: 85 mg/dL (ref 65–99)

## 2016-03-27 MED ORDER — ORAL CARE MOUTH RINSE
15.0000 mL | Freq: Two times a day (BID) | OROMUCOSAL | Status: DC
  Administered 2016-03-27 – 2016-03-28 (×4): 15 mL via OROMUCOSAL

## 2016-03-27 MED ORDER — IPRATROPIUM-ALBUTEROL 0.5-2.5 (3) MG/3ML IN SOLN
3.0000 mL | Freq: Four times a day (QID) | RESPIRATORY_TRACT | Status: DC
  Administered 2016-03-27: 3 mL via RESPIRATORY_TRACT
  Filled 2016-03-27: qty 3

## 2016-03-27 NOTE — Progress Notes (Signed)
Family Medicine Teaching Service Daily Progress Note Intern Pager: [phone]  Patient name: [name] Medical record number: [id number] Date of birth: October 26, 1940 Age: 76 y.o. Gender: female  Primary Care Provider: Rodrigo Ranrystal Dorsey, MD Consultants: CCM Code Status: full  Pt Overview and Major Events to Date:  01/03: admit for increased WOB and diarrhea meeting SIRS criteria 01/04: tested positive for flu 01/06: C Diff positive PCR (neg toxin); Noro virus positive  Assessment and Plan: [name] is a 76 y.o. female presenting with shortness of breath. PMH is significant for multifocal atrial tachycardia, h/o PE (12/2015), Thoracic Aortic Aneurysm, Pulmonary Hypertension, Anxiety, diabetes, hypertension, hyperlipidemia.   #Acute Respiratory Failure with Hypoxia meeting SIRS; Positive Influenza, Improved: [facility]. Baseline is Room Air. Weight stable. BNP slightly elevated to 190.9. Troponin negative. CTA chest without PE. qSOFA 2. History of PE in 12/2015, currently on Xarelto. ECHO c/w EF 55-60%. Today: on 1L [place] and tachypneic. --Telemetry --Continuous Pulse Ox.  --O2 as needed to keep O2 saturation >92%, off BiPAP >> 1L [place] --repeat Blood Cultures: neg, Urine Culture: neg --D/c Vancomycin and Zosyn (01/03>01/05) --Tamiflu (1/4>>) --Starting Duonebs Q6hr scheduled due to tachypnea and course breath sounds  --Albuterol Q4PRN --Consider Repeat CXR and/or repeat ABG  #Diarrhea, w/ PCR pos CDiff, and Noro Virus pos; Stable: Noted by Nursing. Notes diffuse mild abdominal pain. Denies fever or vomiting. Initial KUB c/w ileus w/o obstruction. Repeat showed decreased gas distention. Multiple stools since admission per RN. C-diff antigen pos but neg toxin, PCR pos. --C-diff PCR positive (w/ neg toxin) --GI panel: positive for Noro virus --PO Vanc --Will discuss with team the possibility of DCing PO vanc if negative CDiff toxin changes management; especially with pos  Noro virus >> will call ID for curbside --[facility]  #Lower Extremity Pressure Ulcers, Chronic, Stable: --Consider WOC consult --Tylenol and Ibuprofen PRN pain --Prevalon boot  #Hypokalemia, Acute, Worsening: Potassium 2.9 at admission, will replete as needed. Likely due to diarrhea. --Monitor --Repletion is necessary   #Hypertension, Chronic, Stable: Currently on Carvedilol 3.125 mg twice daily. Goal <130/80 given history of thoracic ascending aneurysm (Stable on CTA) --Monitor BP --Continue home Carvedilol  #Diabetes Mellitus, Chronic, Stable: Not currently on medication. Glucose 153 at admission. --Monitor glucose on BMP --Consider sliding scale  #Anxiety, Chronic, Stable: Medications include Xanax 0.25mg  q8hr as needed. --Holding home medication  #Pressure Ulcers: Noted on left lateral heel and right lateral forefoot --Continue to monitor  #Mild to Moderate Dementia: History of incontinence, wearing adult diapers at baseline. Attention is poor at baseline.  --High risk of Delirium. Monitor.  #History of PE: Home medication includes Rivaroxaban with plan to continue for 3 months, to be evaluated if further treatment is needed in 04/2016. --Continue home Xarelto  #Thoracic Ascending Aneurysm: Unchanged and stable. Goal BP<130/80. --Monitor BP   FEN/GI: clear liquid diet, advance as tolerated  PPx: Xarelto  Disposition: pending improvement of respiratory status, [facility]  Subjective:  Patient laying in bed. No distress but tachypneic. Son at bedside. She is eating but has difficulty doing so w/o help. Son asked many questions, patient was nonverbal unless specifically addressed.   Objective: Temp:  [98 F (36.7 C)-98.6 F (37 C)] 98.6 F (37 C) (01/07 0700) Pulse Rate:  [65-89] 79 (01/07 0800) Resp:  [16-36] 27 (01/07 0800) BP: (115-160)/(57-115) 152/57 (01/07 0800) SpO2:  [93 %-99 %] 93 % (01/07 0800) Physical Exam: General: NAD,  frail elderly woman. Non-toxic but ill appearance  HEENT: normocephalic, atraumatic, moist mucous membranes CV: regular rate and rhythm without murmurs, rubs, or gallops Lungs: diffuse rhonchi and coarse breath sounds bilaterally. No increased work of breathing but tachypneic. Abdomen: soft, minimally distended, minimal generalized tenderness without rebound or guarding, no masses or organomegaly palpable, normoactive bowel sounds present Skin: warm, dry, no rashes, cap refill < 2 seconds Extremities: warm and well perfused, normal tone, non-edematous, two ulcers with dressings on right heel and left medial malleolus  Laboratory: BCx (01/03): MRSA x1 of 2 Cx, c/w probable contaminant BCx (01/04): NG UCx: NGTD Flu: positive ABG: 7.182>7.267>7.376 Lactic acid: 3.6>2.4>2.0 Troponin: negative BNP: 190.0 GI panel: positive Noro C-diff: pos antigen, neg toxin C-diff PCR: pos  Recent Labs Lab 03/24/16 0253 03/25/16 0742 03/26/16 0208  WBC 5.9 9.9 10.0  HGB 10.8* 11.0* 10.3*  HCT 34.0* 34.4* 33.0*  PLT 241 285 303    Recent Labs Lab 03/23/16 1447 03/24/16 0253  03/24/16 1750 03/25/16 0742 03/26/16 0208  NA 146* 146*  < > 146* 145 141  K 2.8* 2.5*  < > 2.9* 3.2* 3.7  CL 110 116*  < > 110 109 104  CO2 23 19*  < > 27 27 29   BUN 29* 22*  < > 16 13 10   CREATININE 0.66 0.57  < > 0.57 0.53 0.44  CALCIUM 9.4 8.2*  < > 9.0 8.9 8.5*  PROT 6.6 5.7*  --   --   --   --   BILITOT 0.3 0.3  --   --   --   --   ALKPHOS 94 67  --   --   --   --   ALT 21 21  --   --   --   --   AST 42* 37  --   --   --   --   GLUCOSE 153* 161*  < > 120* 80 76  < > = values in this interval not displayed.  Imaging/Diagnostic Tests: DG Abd Portable 1 View (03/24/2016) FINDINGS: Decreased gaseous distention of bowel loops throughout the abdomen. No bowel wall thickening or evidence of obstruction. Excreted contrast within urinary bladder. Atherosclerotic calcification aorta and iliac arteries bilaterally.  Bones demineralized.  IMPRESSION: Decreased gaseous distention of bowel since previous exam. Aortic atherosclerosis.  Abd 1 View (KUB) (03/23/2016) FINDINGS: There is mild gaseous distention of small and large bowel. Contrast in the urinary bladder from chest CT earlier today is noted. Atherosclerotic vascular disease is seen.  IMPRESSION: Bowel gas pattern most in keeping with ileus.  CT Angio Chest PE W and/or Wo Contrast (03/23/2016) FINDINGS: Cardiovascular: Previously seen pulmonary emboli are no longer visualized. There is cardiomegaly. Enlarged pulmonary outflow tract consistent with pulmonary arterial hypertension is again seen. 4.1 cm in diameter ascending thoracic aorta is also again seen.  Mediastinum/Nodes: No enlarged mediastinal, hilar, or axillary lymph nodes. Thyroid gland, trachea, and esophagus demonstrate no significant findings.  Lungs/Pleura: No pleural effusion. Scattered bilateral subcentimeter pulmonary nodules are again seen. Some of these measure slightly smaller including a right middle lobe nodule on image 85 measuring 0.6 cm in diameter compared to 0.8 on the prior exam. Nodule in the lingula which had measured 0.6 cm measures 0.5 cm on image 86. No new or enlarging nodules identified. Mild scattered ground-glass attenuation bilaterally is seen.  Upper Abdomen: Calcifications in the spleen and liver are consistent with old granulomatous disease.  Musculoskeletal: Osteopenia multilevel thoracic spondylosis are noted. No lytic or sclerotic lesion.  Review of the  MIP images confirms the above findings.  IMPRESSION: Negative for pulmonary embolus or acute disease. Emboli seen on the comparison examination are normal no longer visualized.  Cardiomegaly.  Findings compatible pulmonary trunk hypertension.  4.1 cm ascending thoracic aorta, unchanged. Recommend annual imaging followup by CTA or MRA. This recommendation follows  2010 ACCF/AHA/AATS/ACR/ASA/SCA/SCAI/SIR/STS/SVM Guidelines for the Diagnosis and Management of Patients with Thoracic Aortic Disease. Circulation. 2010; 121: Z610-R604.  No change to slight decrease in the size of multiple bilateral pulmonary nodules. No new or enlarging nodule is identified.  DG Chest Portable 1 View (03/23/2016) FINDINGS: There is cardiomegaly without edema. No pneumothorax or pleural effusion. Calcified granuloma in the right mid lung is again seen. No bony abnormality. Aortic atherosclerosis is noted.  IMPRESSION: No acute disease.    Kathee Delton, MD 03/27/2016, 11:11 AM PGY-3, [facility] FPTS Intern pager: [phone], text pages welcome

## 2016-03-27 NOTE — Progress Notes (Signed)
(  Note based on reassessment from 03/27/16 around 6pm)  FPTS Interim Progress Note  S: Doing well. Breathing seems to be improved from this morning.  O: BP 136/71   Pulse 67   Temp 98.6 F (37 C) (Axillary)   Resp (!) 27   Wt 180 lb 12.4 oz (82 kg)   SpO2 98%   BMI 35.31 kg/m   General: NAD, frail elderly woman. Non-toxic but ill appearance HEENT: normocephalic, atraumatic, moist mucous membranes CV: regular rate and rhythm without murmurs, rubs, or gallops Lungs: diffuse rhonchi and coarse breath sounds bilaterally. No increased work of breathing but tachypneic.  A/P: 1) C. Difficile:  - Discussed with ID. Agreed that patient is likely colon[facility] without active infection. Noro virus and/or influenza a likely cause for diarrhea.  - Discontinue oral vancomycin  2) Continue with previous plan.    Kathee DeltonIan D McKeag, MD 03/27/2016, 5:36 PM PGY-3, [facility] pager [phone]

## 2016-03-28 LAB — BASIC METABOLIC PANEL
ANION GAP: 10 (ref 5–15)
BUN: 5 mg/dL — ABNORMAL LOW (ref 6–20)
CALCIUM: 9.3 mg/dL (ref 8.9–10.3)
CO2: 33 mmol/L — ABNORMAL HIGH (ref 22–32)
Chloride: 100 mmol/L — ABNORMAL LOW (ref 101–111)
Creatinine, Ser: 0.43 mg/dL — ABNORMAL LOW (ref 0.44–1.00)
GFR calc non Af Amer: >60 mL/min (ref >60)
Glucose, Bld: 111 mg/dL — ABNORMAL HIGH (ref 65–99)
Potassium: 3.3 mmol/L — ABNORMAL LOW (ref 3.5–5.1)
Sodium: 143 mmol/L (ref 135–145)

## 2016-03-28 LAB — GLUCOSE, CAPILLARY
GLUCOSE-CAPILLARY: 117 mg/dL — AB (ref 65–99)
Glucose-Capillary: 108 mg/dL — ABNORMAL HIGH (ref 65–99)
Glucose-Capillary: 109 mg/dL — ABNORMAL HIGH (ref 65–99)
Glucose-Capillary: 137 mg/dL — ABNORMAL HIGH (ref 65–99)

## 2016-03-28 LAB — CULTURE, BLOOD (ROUTINE X 2): CULTURE: NO GROWTH

## 2016-03-28 LAB — CBC
HCT: 39.9 % (ref 36.0–46.0)
HEMOGLOBIN: 12.8 g/dL (ref 12.0–15.0)
MCH: 30 pg (ref 26.0–34.0)
MCHC: 32.1 g/dL (ref 30.0–36.0)
MCV: 93.7 fL (ref 78.0–100.0)
Platelets: 295 10*3/uL (ref 150–400)
RBC: 4.26 MIL/uL (ref 3.87–5.11)
RDW: 14.5 % (ref 11.5–15.5)
WBC: 10 10*3/uL (ref 4.0–10.5)

## 2016-03-28 MED ORDER — CARVEDILOL 6.25 MG PO TABS
6.2500 mg | ORAL_TABLET | Freq: Two times a day (BID) | ORAL | Status: DC
  Administered 2016-03-28 – 2016-03-29 (×2): 6.25 mg via ORAL
  Filled 2016-03-28 (×2): qty 1

## 2016-03-28 MED ORDER — PRO-STAT SUGAR FREE PO LIQD
30.0000 mL | Freq: Three times a day (TID) | ORAL | Status: DC
  Administered 2016-03-28 (×2): 30 mL via ORAL
  Filled 2016-03-28 (×2): qty 30

## 2016-03-28 MED ORDER — LABETALOL HCL 5 MG/ML IV SOLN
5.0000 mg | INTRAVENOUS | Status: DC | PRN
  Administered 2016-03-28: 5 mg via INTRAVENOUS
  Filled 2016-03-28: qty 4

## 2016-03-28 MED ORDER — CLONAZEPAM 0.5 MG PO TABS
0.2500 mg | ORAL_TABLET | Freq: Two times a day (BID) | ORAL | Status: DC
  Administered 2016-03-28 – 2016-03-29 (×3): 0.25 mg via ORAL
  Filled 2016-03-28 (×3): qty 1

## 2016-03-28 MED ORDER — HYDRALAZINE HCL 20 MG/ML IJ SOLN
10.0000 mg | Freq: Once | INTRAMUSCULAR | Status: AC
  Administered 2016-03-28: 10 mg via INTRAVENOUS
  Filled 2016-03-28: qty 1

## 2016-03-28 NOTE — Progress Notes (Signed)
Nutrition Follow-up  DOCUMENTATION CODES:   Obesity unspecified  INTERVENTION:   -D/c Boost Breeze po TID, each supplement provides 250 kcal and 9 grams of protein -30 ml Prostat TID, each supplement provides 100 kcals and 15 grams protein -Continue MVI daily -Downgrade diet to dysphagia 2 (mechanical soft) consistency for ease of intake; pt with minimal teeth  NUTRITION DIAGNOSIS:   Increased nutrient needs related to wound healing as evidenced by estimated needs.  Ongoing  GOAL:   Patient will meet greater than or equal to 90% of their needs  Progressing  MONITOR:   PO intake, Supplement acceptance, Diet advancement, Labs, Weight trends, Skin, I & O's  REASON FOR ASSESSMENT:   Low Braden    ASSESSMENT:   [name] presenting to the ED with respiratory distress.   Pt advanced to a dysphagia 3 diet on 03/26/15. Case discussed with RN, who reports minimal intake secondary to difficulty chewing- pt was unable to consume her breakfast tray this AM and could only consumed her mashed potatoes for supper last night. RN requesting to downgrade diet to mechanical soft for ease of intake. Documented meal completion 35-50%.   Pt unable to provide hx due to cognitive deficit. Pt shares that she has difficulty eating due to minimal teeth, but unable to describe what type of diet she was on PTA.   Nutrition-Focused physical exam completed. Findings are mild fat depletion, mild muscle depletion, and no edema. Muscle depletion found in upper extremities, which may be contributory to advanced age and decline in mobility. Pt refused to left this RD examine lower extremities.   CWOCN note reviewed from 03/26/15; pt with unstageable pressure injuries to rt medial heel, rt medial malleolus, lt lateral distal foot, and lt medial heel. Pt with increased nutrient needs due to wound healing. Will continue MVI and add Prostat supplement.   Labs reviewed: CBGS:  85-133.  Diet Order:  DIET DYS 2 Room service appropriate? Yes; Fluid consistency: Thin  Skin:  Wound (see comment) (UN rt/lt medial heel, lt  foot, rt medial malleolus)  Last BM:  03/27/16  Height:   Ht Readings from Last 1 Encounters:  01/12/16 5' (1.524 m)    Weight:   Wt Readings from Last 1 Encounters:  03/23/16 180 lb 12.4 oz (82 kg)    Ideal Body Weight:  45.5 kg  BMI:  Body mass index is 35.31 kg/m.  Estimated Nutritional Needs:   Kcal:  1650-1850  Protein:  80-95 grams  Fluid:  >1.6 L  EDUCATION NEEDS:   Education needs addressed  Minami Arriaga A. Mayford KnifeWilliams, RD, LDN, CDE Pager: [phone] After hours Pager: [phone]

## 2016-03-28 NOTE — NC FL2 (Signed)
[place] MEDICAID FL2 LEVEL OF CARE SCREENING TOOL     IDENTIFICATION  Patient Name: [name] Birthdate: 27-Aug-1940 Sex: female Admission Date (Current Location): 03/23/2016  [facility] and [place] Number:  Producer, television/film/video and Address:  [facility], [address] [place], [place] [address]      Provider Number: [id number]  Attending Physician Name and Address:  Latrelle Dodrill, MD  Relative Name and Phone Number:       Current Level of Care: Hospital Recommended Level of Care: Skilled Nursing Facility Prior Approval Number:    Date Approved/Denied:   PASRR Number: [id number]  Discharge Plan: SNF    Current Diagnoses: Patient Active Problem List   Diagnosis Date Noted  . Infection due to Norovirus species   . Influenza with respiratory manifestation   . Acute respiratory failure with hypoxia and hypercapnia (HCC)   . Diarrhea   . SOB (shortness of breath)   . Influenza-like symptoms 03/23/2016  . Positive blood culture 01/15/2016  . Acute cystitis without hematuria   . 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 Hypoxia 01/11/2016  . Pulmonary embolism with acute cor pulmonale (HCC) 01/11/2016  . Pressure injury of skin 01/11/2016  . Prediabetes 01/11/2016  . Thoracic aortic aneurysm (HCC) 01/11/2016  . 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 Pulmonary nodules/lesions, multiple   . Anxiety state 09/30/2015  . Weight gain 08/24/2015  . Pruritic intertrigo 07/24/2015  . Hypercalcemia 05/18/2015  . Osteoarthritis 03/11/2015  . Anemia 03/11/2015  . Bone spur of left foot   . 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 Person living in residential institution 12/03/2014  . Diabetes mellitus without complication (HCC) 11/29/2014  . Acute delirium 11/29/2014  . Hyperlipemia   . Essential hypertension   . Fall   . Solitary pulmonary nodule, possible 08/29/2014    Orientation RESPIRATION BLADDER Height & Weight     Self  O2  (Nasal cannula 2L) Incontinent Weight: 82 kg (180 lb 12.4 oz) Height:     BEHAVIORAL SYMPTOMS/MOOD NEUROLOGICAL BOWEL NUTRITION STATUS      Incontinent Diet (Please see DC Summary)  AMBULATORY STATUS COMMUNICATION OF NEEDS Skin   Extensive Assist Verbally PU Stage and Appropriate Care (Unstageable pressure injury on heel;ankle;foot; deep tissue injury on heel; Stage III on sacrum)                       Personal Care Assistance Level of Assistance  Bathing, Feeding, Dressing Bathing Assistance: Maximum assistance Feeding assistance: Maximum assistance Dressing Assistance: Maximum assistance     Functional Limitations Info             SPECIAL CARE FACTORS FREQUENCY  PT (By licensed PT)     PT Frequency: not yet assessed              Contractures      Additional Factors Info  Code Status, Allergies, Insulin Sliding Scale, Isolation Precautions Code Status Info: Full Allergies Info: Aspirin, Latex, Penicillins   Insulin Sliding Scale Info: 3x daily Isolation Precautions Info: Droplet/Enteric precautions     Current Medications (03/28/2016):  This is the current hospital active medication list Current Facility-Administered Medications  Medication Dose Route Frequency Provider Last Rate Last Dose  . acetaminophen (TYLENOL) tablet 650 mg  650 mg Oral Q6H PRN Wendee Beavers, DO   650 mg at 03/26/16 1401  . albuterol (PROVENTIL) (2.5 MG/3ML) 0.083% nebulizer solution 3 mL  3 mL Inhalation  Q4H PRN Almon Herculesaye T Gonfa, MD      . barrier cream (non-specified) 1 application  1 application Topical BID PRN Latrelle DodrillBrittany J McIntyre, MD      . carvedilol (COREG) tablet 3.125 mg  3.125 mg Oral BID WC [place] N Rumley, DO   3.125 mg at 03/27/16 1759  . collagenase (SANTYL) ointment   Topical Daily Latrelle DodrillBrittany J McIntyre, MD   2 application at 03/28/16 [phone]  . feeding supplement (BOOST / RESOURCE BREEZE) liquid 1 Container  1 Container Oral TID BM Latrelle DodrillBrittany J McIntyre, MD   1 Container at  03/27/16 2102  . ibuprofen (ADVIL,MOTRIN) tablet 100 mg  100 mg Oral Q8H PRN Elmon Elseavid J McMullen, DO      . insulin aspart (novoLOG) injection 0-9 Units  0-9 Units Subcutaneous TID WC Almon Herculesaye T Gonfa, MD   1 Units at 03/27/16 1759  . MEDLINE mouth rinse  15 mL Mouth Rinse BID Moses MannersWilliam A Hensel, MD   15 mL at 03/27/16 2120  . multivitamin with minerals tablet 1 tablet  1 tablet Oral Daily Latrelle DodrillBrittany J McIntyre, MD   1 tablet at 03/27/16 0850  . oseltamivir (TAMIFLU) capsule 75 mg  75 mg Oral BID Kathee DeltonIan D McKeag, MD   75 mg at 03/27/16 2119  . rivaroxaban (XARELTO) tablet 20 mg  20 mg Oral Q supper [facility] Center[facility] N Rumley, DO   20 mg at 03/27/16 1759  . sodium chloride flush (NS) 0.9 % injection 3 mL  3 mL Intravenous Q12H [place] N Rumley, DO   3 mL at 03/27/16 2101     Discharge Medications: Please see discharge summary for a list of discharge medications.  Relevant Imaging Results:  Relevant Lab Results:   Additional Information SS#: [id number]  Mearl Latinadia S Orhan Mayorga, LCSWA

## 2016-03-28 NOTE — Progress Notes (Signed)
Family Medicine Teaching Service Daily Progress Note Intern Pager: [phone]  Patient name: [name] Medical record number: [id number] Date of birth: [birth year] Age: 76 y.o. Gender: female  Primary Care Provider: Rodrigo Ran, MD Consultants: CCM Code Status: full  Pt Overview and Major Events to Date:  01/03: admit for increased WOB and diarrhea meeting SIRS criteria 01/04: tested positive for flu 01/06: positive Norovirus  Assessment and Plan: [name] is a 76 y.o. female presenting with shortness of breath. PMH is significant for multifocal atrial tachycardia, h/o PE (12/2015), Thoracic Aortic Aneurysm, Pulmonary Hypertension, Anxiety, diabetes, hypertension, hyperlipidemia.   #Acute Respiratory Failure with Hypoxia meeting SIRS, Acute, Imporved: concerning for sepsis. Arrived from [facility] with increased WOB. Initially needed BiPAP. Weight stable, 180 lbs here and in 08/2015. BNP slightly elevated to 190.9, no prior for comparison. Troponin negative. Lactic Acid 3.32, trending down. WBC wnl at 9.9. CXR without acute pathology, no signs of pleural edema. CTA chest without PE, thoracic aortic aneurysm stable. Meets SIRS criteria, qSOFA 2. Suspected pulmonary HTN given evidence of pulmonary artery enlargement on CTA. History of PE in 12/2015, on Xarelto. Bilateral Pulmonary Nodules noted, patient declining further imaging since she does not want aggressive treatments if it is cancer. ECHO c/w EF 55-60%, no wall motion abn, G1DD. Received dose of Vancomycin and Cefepime in ED, but d/c given tested pos for flu. BCx initial with x1 pos coag neg staph, other neg. Repeat BCx NGTD, UCX NGTD.  --Telemetry --Continouos Pulse Ox.  --O2 as needed to keep O2 saturation >92%, off BiPAP, on [place] 2L --Duonebs scheduled q6hr, albuterol q4h PRN --D/c Vancomycin and Zosyn (01/03>01/05) given flu positive  #C-Diff, Diarrhea with Ileus, Acute, Stable: Noted by Nursing. Notes diffuse mild abdominal  pain. Denies fever or vomiting. Initial KUB c/w ileus w/o obstruction. Repeat showed decreased gas distention. Multiple stools since admission per RN. C-diff antigen pos but neg toxin, PCR positive. GI panel for norovirus. 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 Will d/c broad spectrum abx and f/u BCx repeats given last was likely contaminant. --Tamiflu (day 5)  #Lower Extremity Pressure Ulcers, Chronic, Stable: --Consider WOC consult --Tylenol and Ibuprofen PRN pain --Prevalon boot  #Hypokalemia, Acute, Worsening: Potassium 2.9 at admission, now 3.2. KCl Multirun given in ED, will replete as needed. Likely due to diarrhea. --Repletion is necessary   #Hypertension, Chronic, Stable: Currently on Carvedilol 3.125 mg twice daily. Goal <130/80 given history of thoracic ascending aneurysm.  --Monitor BP --Continue home Carvedilol  #Diabetes Mellitus, Chronic, Stable: Not currently on medication. Glucose 153 at admission. --Monitor glucose on BMP --Consider sliding scale  #Anxiety, Chronic, Stable: Medications include Xanax 0.25mg  q8hr as needed. --Holding home medication --Will add Klonopin 0.25 mg BID  #Pressure Ulcers: Noted on left lateral heel and right lateral forefoot --Continue to monitor  #Mild to Moderate Dementia: History of incontinence, wearing adult diapers at baseline. Attention is poor at baseline. Usually oriented, however did not cooperate with orientation questions at admission. --High risk of Delirium. Monitor.  #History of PE: Home medication includes Rivaroxaban with plan to continue for 3 months, to be evaluated if further treatment is needed in 04/2016. --Continue home Xarelto  #Thoracic Ascending Aneurysm:  Unchanged and stable. Goal BP<130/80. --Monitor BP   FEN/GI: clear  liquid diet, advance as tolerated  PPx: Xarelto  Disposition: pending improvement of respiratory status and improvement of norovirus/flu, anticipate d/c back to [facility]  Subjective:  Patient unable to answer upon questioning given underlying demential. Patient states "don't lie to me," which she told her son over the phone per son.   Objective: Temp:  [98 F (36.7 C)-99 F (37.2 C)] 98 F (36.7 C) (01/08 0352) Pulse Rate:  [67-104] 100 (01/08 0400) Resp:  [18-34] 33 (01/08 0400) BP: (136-184)/(57-109) 176/105 (01/08 0400) SpO2:  [93 %-98 %] 96 % (01/08 0400) Physical Exam: General: well nourished, well developed, confused with non-toxic appearance HEENT: normocephalic, atraumatic, moist mucous membranes CV: regular rate and rhythm without murmurs, rubs, or gallops Lungs: diffuse rhonchi and coarse breath sounds without wheeze bilaterally with no increased work of breathing on 2L [place] Abdomen: soft, minimally distended, minimally diffusely tender without rebound or guarding, no masses or organomegaly palpable, normoactive bowel sounds present Skin: warm, dry, no rashes, cap refill < 2 seconds Extremities: warm and well perfused, normal tone, non-edematous, two ulcers with dressings on right heel and left medial malleolus  Laboratory: BCx (01/03): MRSA x1 of 2 Cx, c/w probable contaminant BCx (01/04): negative UCx: NGTD Flu: positive ABG: 7.182>7.267>7.376 Lactic acid: 3.6>2.4>2.0 Troponin: negative BNP: 190.0 GI panel: norovirus+ C-diff: pos antigen, neg toxin C-diff PCR: positive  Recent Labs Lab 03/24/16 0253 03/25/16 0742 03/26/16 0208  WBC 5.9 9.9 10.0  HGB 10.8* 11.0* 10.3*  HCT 34.0* 34.4* 33.0*  PLT 241 285 303    Recent Labs Lab 03/23/16 1447 03/24/16 0253  03/24/16 1750 03/25/16 0742 03/26/16 0208  NA 146* 146*  < > 146* 145 141  K 2.8* 2.5*  < > 2.9* 3.2* 3.7  CL 110 116*  < > 110  109 104  CO2 23 19*  < > 27 27 29   BUN 29* 22*  < > 16 13 10   CREATININE 0.66 0.57  < > 0.57 0.53 0.44  CALCIUM 9.4 8.2*  < > 9.0 8.9 8.5*  PROT 6.6 5.7*  --   --   --   --   BILITOT 0.3 0.3  --   --   --   --   ALKPHOS 94 67  --   --   --   --   ALT 21 21  --   --   --   --   AST 42* 37  --   --   --   --   GLUCOSE 153* 161*  < > 120* 80 76  < > = values in this interval not displayed.  Imaging/Diagnostic Tests: Transthoracic Echocardiography (03/24/2016) Study Conclusions - Left ventricle: The cavity size was normal. Systolic function was   normal. The estimated ejection fraction was in the range of 55%   to 60%. Wall motion was normal; there were no regional wall   motion abnormalities. Doppler parameters are consistent with   abnormal left ventricular relaxation (grade 1 diastolic   dysfunction). - Aortic valve: There was mild regurgitation. - Atrial septum: There was increased thickness of the septum,   consistent with lipomatous hypertrophy. No defect or patent   foramen ovale was identified.  DG Abd Portable 1 View (03/24/2016) FINDINGS: Decreased gaseous distention of bowel loops throughout the abdomen. No bowel wall thickening or evidence of obstruction. Excreted contrast within urinary bladder. Atherosclerotic calcification aorta and iliac arteries bilaterally. Bones demineralized.  IMPRESSION: Decreased gaseous distention of bowel since previous exam. Aortic atherosclerosis.  Abd 1 View (KUB) (03/23/2016) FINDINGS: There is mild gaseous distention of small and large bowel. Contrast in the urinary bladder from chest CT earlier today is noted. Atherosclerotic vascular disease is seen.  IMPRESSION: Bowel gas pattern most in keeping with ileus.  CT Angio Chest PE W and/or Wo Contrast (03/23/2016) FINDINGS: Cardiovascular: Previously seen pulmonary emboli are no longer visualized. There is cardiomegaly. Enlarged pulmonary outflow tract consistent with pulmonary  arterial hypertension is again seen. 4.1 cm in diameter ascending thoracic aorta is also again seen.  Mediastinum/Nodes: No enlarged mediastinal, hilar, or axillary lymph nodes. Thyroid gland, trachea, and esophagus demonstrate no significant findings.  Lungs/Pleura: No pleural effusion. Scattered bilateral subcentimeter pulmonary nodules are again seen. Some of these measure slightly smaller including a right middle lobe nodule on image 85 measuring 0.6 cm in diameter compared to 0.8 on the prior exam. Nodule in the lingula which had measured 0.6 cm measures 0.5 cm on image 86. No new or enlarging nodules identified. Mild scattered ground-glass attenuation bilaterally is seen.  Upper Abdomen: Calcifications in the spleen and liver are consistent with old granulomatous disease.  Musculoskeletal: Osteopenia multilevel thoracic spondylosis are noted. No lytic or sclerotic lesion.  Review of the MIP images confirms the above findings.  IMPRESSION: Negative for pulmonary embolus or acute disease. Emboli seen on the comparison examination are normal no longer visualized.  Cardiomegaly.  Findings compatible pulmonary trunk hypertension.  4.1 cm ascending thoracic aorta, unchanged. Recommend annual imaging followup by CTA or MRA. This recommendation follows 2010 ACCF/AHA/AATS/ACR/ASA/SCA/SCAI/SIR/STS/SVM Guidelines for the Diagnosis and Management of Patients with Thoracic Aortic Disease. Circulation. 2010; 121: Z610-R604: [id number].  No change to slight decrease in the size of multiple bilateral pulmonary nodules. No new or enlarging nodule is identified.  DG Chest Portable 1 View (03/23/2016) FINDINGS: There is cardiomegaly without edema. No pneumothorax or pleural effusion. Calcified granuloma in the right mid lung is again seen. No bony abnormality. Aortic atherosclerosis is noted.  IMPRESSION: No acute disease.    Wendee Beaversavid J McMullen, DO 03/28/2016, 7:19 AM PGY-1, [facility] FPTS Intern pager: [phone], text pages welcome

## 2016-03-28 NOTE — Progress Notes (Signed)
Patient transferred to 2W33 with all belongings.

## 2016-03-28 NOTE — Progress Notes (Signed)
Text paged family medicine resident for elevated blood pressure. No PRN medication currently available on MAR.  Spoke with Dr. Artist PaisYoo, she is going to watch the pressure for now. RN will call back if pressure continues to increase. New dose of carvedilol given at 1645.

## 2016-03-28 NOTE — Care Management Important Message (Signed)
Important Message  Patient Details  Name: [name] MRN: [id number] Date of Birth: [birth year]   Medicare Important Message Given:  Yes    Hanley HaysDowell, Sivan Cuello T, RN 03/28/2016, 11:32 AM

## 2016-03-28 NOTE — Progress Notes (Signed)
Dr. Artist PaisYoo called back and has prescribed PRN labetolol with parameters. Manual blood pressure before administration was 148/118. After administration blood pressure recheck was 108/69. Patient resting quietly at this time.

## 2016-03-28 NOTE — Clinical Social Work Note (Signed)
Clinical Social Work Assessment  Patient Details  Name: [name] MRN: [id number] Date of Birth: [birth year]  Date of referral:  03/28/16               Reason for consult:  Discharge Planning                Permission sought to share information with:  Facility Medical sales representativeContact Representative, Family Supports Permission granted to share information::  No  Name::     Therapist, musicBobby  Agency::  [facility]  Relationship::  Son  Contact Information:  [phone]  Housing/Transportation Living arrangements for the past 2 months:  Skilled Nursing Facility Source of Information:  Adult Children Patient Interpreter Needed:  None Criminal Activity/Legal Involvement Pertinent to Current Situation/Hospitalization:  No - Comment as needed Significant Relationships:  Adult Children Lives with:  Facility Resident Do you feel safe going back to the place where you live?  Yes Need for family participation in patient care:  Yes (Comment)  Care giving concerns:  CSW received consult regarding discharge planning. Patient is disoriented. CSW spoke with patient's son. [facility] and son would like her to return there at discharge. Facility is aware of discharge plan. CSW to continue to follow for discharge needs.   Social Worker assessment / plan:  CSW spoke with patient's son regarding discharge plan.  Employment status:  Retired Database administratornsurance information:  Managed Medicare PT Recommendations:  Not assessed at this time Information / Referral to community resources:  Skilled Nursing Facility  Patient/Family's Response to care:  Patient's son expressed agreement with getting patient back to her familiar environment.   Patient/Family's Understanding of and Emotional Response to Diagnosis, Current Treatment, and Prognosis:   Patient's son expressed understanding of CSW role and discharge process. No questions/concerns about plan or treatment.    Emotional Assessment Appearance:   Appears stated age Attitude/Demeanor/Rapport:  Unable to Assess Affect (typically observed):  Unable to Assess Orientation:  Oriented to Self Alcohol / Substance use:  Not Applicable Psych involvement (Current and /or in the community):  No (Comment)  Discharge Needs  Concerns to be addressed:  Care Coordination Readmission within the last 30 days:  No Current discharge risk:  None Barriers to Discharge:  Continued Medical Work up   [name], LCSWA 03/28/2016, 10:40 AM

## 2016-03-28 NOTE — Progress Notes (Signed)
Report given to 2W nurse.

## 2016-03-29 LAB — CULTURE, BLOOD (ROUTINE X 2)
Culture: NO GROWTH
Culture: NO GROWTH

## 2016-03-29 LAB — BASIC METABOLIC PANEL
ANION GAP: 6 (ref 5–15)
BUN: 12 mg/dL (ref 6–20)
CALCIUM: 9.6 mg/dL (ref 8.9–10.3)
CO2: 37 mmol/L — ABNORMAL HIGH (ref 22–32)
Chloride: 97 mmol/L — ABNORMAL LOW (ref 101–111)
Creatinine, Ser: 0.45 mg/dL (ref 0.44–1.00)
GFR calc Af Amer: >60 mL/min (ref >60)
Glucose, Bld: 124 mg/dL — ABNORMAL HIGH (ref 65–99)
POTASSIUM: 3.3 mmol/L — AB (ref 3.5–5.1)
SODIUM: 140 mmol/L (ref 135–145)

## 2016-03-29 LAB — GLUCOSE, CAPILLARY
GLUCOSE-CAPILLARY: 106 mg/dL — AB (ref 65–99)
GLUCOSE-CAPILLARY: 132 mg/dL — AB (ref 65–99)

## 2016-03-29 MED ORDER — HYDROCHLOROTHIAZIDE 12.5 MG PO TABS: 12.5000 mg | ORAL_TABLET | Freq: Every day | ORAL | 0 refills | Status: DC

## 2016-03-29 MED ORDER — POTASSIUM CHLORIDE CRYS ER 20 MEQ PO TBCR: 40.0000 meq | EXTENDED_RELEASE_TABLET | ORAL | Status: AC

## 2016-03-29 MED ORDER — LORAZEPAM 0.5 MG PO TABS
0.5000 mg | ORAL_TABLET | Freq: Once | ORAL | Status: AC
  Administered 2016-03-29: 0.5 mg via ORAL
  Filled 2016-03-29: qty 1

## 2016-03-29 MED ORDER — POTASSIUM CHLORIDE CRYS ER 20 MEQ PO TBCR: 40.0000 meq | EXTENDED_RELEASE_TABLET | Freq: Two times a day (BID) | ORAL | Status: DC

## 2016-03-29 MED ORDER — PRO-STAT SUGAR FREE PO LIQD: 30.0000 mL | Freq: Three times a day (TID) | ORAL | 0 refills | Status: AC

## 2016-03-29 MED ORDER — CARVEDILOL 6.25 MG PO TABS: 6.2500 mg | ORAL_TABLET | Freq: Two times a day (BID) | ORAL | 0 refills | Status: AC

## 2016-03-29 NOTE — Progress Notes (Signed)
[facility]  Minerva Endsiffany N Dawn Convery RN

## 2016-03-29 NOTE — Progress Notes (Signed)
Family Medicine Teaching Service Daily Progress Note Intern Pager: [phone]  Patient name: Lynn KudoMina S Bueso Medical record number: [id number] Date of birth: December 15, 1940 Age: 76 y.o. Gender: female  Primary Care Provider: Rodrigo Ranrystal Dorsey, MD Consultants: CCM Code Status: full  Pt Overview and Major Events to Date:  01/03: admit for increased WOB and diarrhea meeting SIRS criteria 01/04: tested positive for flu 01/06: positive Norovirus  Assessment and Plan: [name] is a 76 y.o. female presenting with shortness of breath. PMH is significant for multifocal atrial tachycardia, h/o PE (12/2015), Thoracic Aortic Aneurysm, Pulmonary Hypertension, Anxiety, diabetes, hypertension, hyperlipidemia.   #Respiratory Failure with Hypoxia meeting SIRS, Acute, Imporved: [facility] concerning for sepsis with increased WOB requiring BiPAP. Weight stable at 180 lbs c/w 08/2015. BNP slightly elevated to 190.9, no prior for comparison. Troponin negative. Lactic Acid 3.32, trending down. WBC wnl at 9.9. CXR without acute pathology, no signs of pleural edema. CTA chest without PE, thoracic AA stable. Meets SIRS criteria, qSOFA 2. Suspected pulmonary HTN given evidence of pulmonary artery enlargement on CTA. History of PE in 12/2015, on Xarelto. B/l pulmonary nodules noted, patient declining further imaging since she does not want aggressive treatments if cancer. ECHO c/w EF 55-60%, no wall motion abn, G1DD. Received dose of vanc and cefepime in ED, but d/c given flu+. BCx initial with x1 pos coag neg staph, other neg. Repeat BCx NGTD, UCX NGTD. Flu likely source for SOB. May contribute to diarrhea. Completed 5 days of Tamiflu. --Telemetry --Continouos pulse ox  --O2 as needed to keep O2 saturation >92%, off BiPAP, on [place] 2L --Albuterol q4h PRN --D/c Vancomycin and Zosyn (01/03>01/05) given flu positive --Incentive spirometry  #Diarrhea with Ileus, Acute, Improved: diffuse mild abdominal pain. Denies  fever or vomiting. Initial KUB c/w ileus w/o obstruction. Repeat showed decreased gas distention. Multiple stools since admission per RN. C-diff antigen pos but neg toxin, PCR positive. GI panel for norovirus+. 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 --Initially given vanc 01/06-01/07, d/c'd per ID given likely carrier but not infectious --Monitoring I&O though bowel and bladder incontinent  #Lower Extremity Pressure Ulcers, Chronic, Stable: WOC consulted. Barrier cream BID given bowel and bladder incontinent. --Tylenol and Ibuprofen PRN pain --Prevalon boot  #Hypokalemia, Acute, Improving: Potassium 2.9 at admission, last K 3.3. KCl Multirun given in ED, will replete as needed. Likely due to diarrhea. --Repletion is necessary   #Hypertension, Chronic, Stable: Currently on Carvedilol 6.25 mg BID. Goal <130/80 given history of thoracic ascending aneurysm.  --Monitor BP --Carvedilol 6.25 mg BID, labetalol 5 mg IV PRN SBP >160, DBP >100  #Diabetes Mellitus, Chronic, Stable: Not currently on medication at home. Glucose 153 at admission. --Monitor glucose on BMP --Sensitive sliding scale  #Anxiety, Chronic, Stable: Medications include Xanax 0.25 mg q8hr as needed. --Holding home medication --Will add Klonopin 0.25 mg BID  #Mild to Moderate Dementia: History of incontinence, wearing adult diapers at baseline. Attention is poor at baseline. Usually oriented, however did not cooperate with orientation questions at admission. --High risk of Delirium, monitor  #History of PE: Home medication includes Rivaroxaban with plan to continue for 3 months, to be evaluated if further treatment is needed in 04/2016. --Continue home Xarelto  #Thoracic Ascending Aneurysm: Unchanged and stable. Goal BP<130/80. --Monitor BP   FEN/GI: dysphagia 2 diet, advance as tolerated  PPx: Xarelto  Disposition: pending improvement of  respiratory status and improvement of norovirus/flu, anticipate  d/c back to [facility]  Subjective:  Patient says she wants to go to the top floor. Unable to verbalize answers to ROS.  Objective: Temp:  [97.7 F (36.5 C)-98.2 F (36.8 C)] 97.7 F (36.5 C) (01/09 0500) Pulse Rate:  [86-125] 99 (01/09 0500) Resp:  [22-41] 22 (01/09 0500) BP: (113-179)/(78-144) 150/125 (01/09 0500) SpO2:  [94 %-100 %] 99 % (01/09 0500) Weight:  [149 lb (67.6 kg)] 149 lb (67.6 kg) (01/09 0500) Physical Exam: General: well nourished, well developed, confused with non-toxic appearance HEENT: normocephalic, atraumatic, moist mucous membranes CV: regular rate and rhythm without murmurs, rubs, or gallops Lungs: coarse breath sounds b/l without wheeze bilaterally with no increased work of breathing on 2L [place] Abdomen: soft, minimally distended, minimally diffusely tender without rebound or guarding, no masses or organomegaly palpable, normoactive bowel sounds present Skin: warm, dry, no rashes, cap refill < 2 seconds Extremities: warm and well perfused, normal tone, non-edematous, two ulcers with dressings on right heel and left medial malleolus  Laboratory: BCx (01/03): MRSA x1 of 2 Cx, c/w probable contaminant BCx (01/04): negative UCx: NGTD Flu: positive ABG: 7.182>7.267>7.376 Lactic acid: 3.6>2.4>2.0 Troponin: negative BNP: 190.0 GI panel: norovirus+ C-diff: pos antigen, neg toxin C-diff PCR: positive  Recent Labs Lab 03/25/16 0742 03/26/16 0208 03/28/16 1011  WBC 9.9 10.0 10.0  HGB 11.0* 10.3* 12.8  HCT 34.4* 33.0* 39.9  PLT 285 303 295    Recent Labs Lab 03/23/16 1447 03/24/16 0253  03/25/16 0742 03/26/16 0208 03/28/16 1011  NA 146* 146*  < > 145 141 143  K 2.8* 2.5*  < > 3.2* 3.7 3.3*  CL 110 116*  < > 109 104 100*  CO2 23 19*  < > 27 29 33*  BUN 29* 22*  < > 13 10 5*  CREATININE 0.66 0.57  < > 0.53 0.44 0.43*  CALCIUM 9.4 8.2*  < > 8.9 8.5* 9.3  PROT 6.6 5.7*  --   --   --    --   BILITOT 0.3 0.3  --   --   --   --   ALKPHOS 94 67  --   --   --   --   ALT 21 21  --   --   --   --   AST 42* 37  --   --   --   --   GLUCOSE 153* 161*  < > 80 76 111*  < > = values in this interval not displayed.  Imaging/Diagnostic Tests: Transthoracic Echocardiography (03/24/2016) Study Conclusions - Left ventricle: The cavity size was normal. Systolic function was   normal. The estimated ejection fraction was in the range of 55%   to 60%. Wall motion was normal; there were no regional wall   motion abnormalities. Doppler parameters are consistent with   abnormal left ventricular relaxation (grade 1 diastolic   dysfunction). - Aortic valve: There was mild regurgitation. - Atrial septum: There was increased thickness of the septum,   consistent with lipomatous hypertrophy. No defect or patent   foramen ovale was identified.  DG Abd Portable 1 View (03/24/2016) FINDINGS: Decreased gaseous distention of bowel loops throughout the abdomen. No bowel wall thickening or evidence of obstruction. Excreted contrast within urinary bladder. Atherosclerotic calcification aorta and iliac arteries bilaterally. Bones demineralized.  IMPRESSION: Decreased gaseous distention of bowel since previous exam. Aortic atherosclerosis.  Abd 1 View (KUB) (03/23/2016) FINDINGS: There is mild gaseous distention of small and large bowel. Contrast in the urinary  bladder from chest CT earlier today is noted. Atherosclerotic vascular disease is seen.  IMPRESSION: Bowel gas pattern most in keeping with ileus.  CT Angio Chest PE W and/or Wo Contrast (03/23/2016) FINDINGS: Cardiovascular: Previously seen pulmonary emboli are no longer visualized. There is cardiomegaly. Enlarged pulmonary outflow tract consistent with pulmonary arterial hypertension is again seen. 4.1 cm in diameter ascending thoracic aorta is also again seen.  Mediastinum/Nodes: No enlarged mediastinal, hilar, or axillary  lymph nodes. Thyroid gland, trachea, and esophagus demonstrate no significant findings.  Lungs/Pleura: No pleural effusion. Scattered bilateral subcentimeter pulmonary nodules are again seen. Some of these measure slightly smaller including a right middle lobe nodule on image 85 measuring 0.6 cm in diameter compared to 0.8 on the prior exam. Nodule in the lingula which had measured 0.6 cm measures 0.5 cm on image 86. No new or enlarging nodules identified. Mild scattered ground-glass attenuation bilaterally is seen.  Upper Abdomen: Calcifications in the spleen and liver are consistent with old granulomatous disease.  Musculoskeletal: Osteopenia multilevel thoracic spondylosis are noted. No lytic or sclerotic lesion.  Review of the MIP images confirms the above findings.  IMPRESSION: Negative for pulmonary embolus or acute disease. Emboli seen on the comparison examination are normal no longer visualized.  Cardiomegaly.  Findings compatible pulmonary trunk hypertension.  4.1 cm ascending thoracic aorta, unchanged. Recommend annual imaging followup by CTA or MRA. This recommendation follows 2010 ACCF/AHA/AATS/ACR/ASA/SCA/SCAI/SIR/STS/SVM Guidelines for the Diagnosis and Management of Patients with Thoracic Aortic Disease. Circulation. 2010; 121: Z610-R604.  No change to slight decrease in the size of multiple bilateral pulmonary nodules. No new or enlarging nodule is identified.  DG Chest Portable 1 View (03/23/2016) FINDINGS: There is cardiomegaly without edema. No pneumothorax or pleural effusion. Calcified granuloma in the right mid lung is again seen. No bony abnormality. Aortic atherosclerosis is noted.  IMPRESSION: No acute disease.    Wendee Beavers, DO 03/29/2016, 7:27 AM PGY-1, [place] [facility] FPTS Intern pager: [phone], text pages welcome

## 2016-03-29 NOTE — Progress Notes (Signed)
Discharged patient per MD order. [facility]. Removed IV, D/C telemetry. No questions at this time. Social work made family aware.  Minerva Endsiffany N Jeanet Lupe RN

## 2016-03-29 NOTE — Progress Notes (Signed)
Clinical Social Worker facilitated patient discharge including contacting patient family and facility to confirm patient discharge plans.  Clinical information faxed to facility and family agreeable with plan.  CSW arranged ambulance transport via [facility] to [facility] .  RN Tiffany to call [phone] for report prior to discharge.  Clinical Social Worker will sign off for now as social work intervention is no longer needed. Please consult us again if new need arises.  Marrianne MoodAshley Turkessa Ostrom, MSW, [employer] [phone]

## 2016-03-30 ENCOUNTER — Non-Acute Institutional Stay: Payer: Medicare Other | Admitting: Family Medicine

## 2016-03-30 ENCOUNTER — Encounter: Payer: Self-pay | Admitting: Family Medicine

## 2016-03-30 DIAGNOSIS — Z593 Problems related to living in residential institution: Secondary | ICD-10-CM | POA: Diagnosis not present

## 2016-03-30 DIAGNOSIS — J111 Influenza due to unidentified influenza virus with other respiratory manifestations: Secondary | ICD-10-CM | POA: Diagnosis not present

## 2016-03-30 DIAGNOSIS — A0811 Acute gastroenteropathy due to Norwalk agent: Secondary | ICD-10-CM

## 2016-03-30 DIAGNOSIS — R410 Disorientation, unspecified: Secondary | ICD-10-CM | POA: Diagnosis not present

## 2016-03-30 DIAGNOSIS — I10 Essential (primary) hypertension: Secondary | ICD-10-CM | POA: Diagnosis not present

## 2016-03-30 DIAGNOSIS — L8995 Pressure ulcer of unspecified site, unstageable: Secondary | ICD-10-CM

## 2016-03-30 DIAGNOSIS — A09 Infectious gastroenteritis and colitis, unspecified: Secondary | ICD-10-CM

## 2016-03-30 DIAGNOSIS — R197 Diarrhea, unspecified: Secondary | ICD-10-CM

## 2016-03-30 NOTE — Progress Notes (Signed)
[facility]  Visit  Primary Care Provider: Rodrigo Ranrystal Dorsey, MD [facility] Care[facility] Visit Information: a scheduled visit following a recent hospitalization Patient accompanied by nursing attendant Source(s) of information for visit: patient and nursing home  Chief Complaint: No chief complaint on file.  Nursing Concerns: delirium Nutrition Concerns: n/a Wound Care Nurse Concerns: bilateral foot wounds PT / OT Concerns: n/a  HISTORY OF PRESENT ILLNESS: [name] is a 76 y.o. female that was [facility] for increased WOB.  She was treated with a short course of abx until she was found to be Influenza A positive and these were subsequently discontinued.  She was noted to be hypokalemic during hospitalization, which what thought to be secondary to diarrhea.  Her K was 3.3 at discharge.  There was concern for C Diff, however it was determined that she was actually colonized and ID did not recommend treatment.  She tested positive for Norovirus, which would explain her acute GI illness.    Today, patient denies abdominal pain, vomiting.  She reports some wheeze and some improvement in diarrhea.  Nursing staff also report improvement in diarrhea.  There is concern regarding her mentation, as she seems delirious.  She reports she is at [place]" and perseverates on "someone lying" to her.      Outpatient Encounter Prescriptions as of 03/30/2016  Medication Sig  . acetaminophen (TYLENOL) 650 MG CR tablet Take 650-1,300 mg by mouth See admin instructions. Take 650 mg at 10 AM. Take 1300 mg at [facility]6PM. Take 1300 mg at 2AM.  . albuterol (ACCUNEB) 0.63 MG/3ML nebulizer solution Take 1 ampule by nebulization every 4 (four) hours as needed for wheezing.  Marland Kitchen. albuterol (PROVENTIL HFA;VENTOLIN HFA) 108 (90 BASE) MCG/ACT inhaler Inhale 2 puffs into the lungs every 6 (six) hours as needed for wheezing or shortness of breath.  . Amino Acids-Protein Hydrolys  (FEEDING SUPPLEMENT, PRO-STAT SUGAR FREE 64,) LIQD Take 30 mLs by mouth 3 (three) times daily between meals.  . bisacodyl (DULCOLAX) 10 MG suppository Place 10 mg rectally as needed for moderate constipation.  . calcium citrate (CALCITRATE - DOSED IN MG ELEMENTAL CALCIUM) 950 MG tablet Take 200 mg of elemental calcium by mouth daily.  . carvedilol (COREG) 6.25 MG tablet Take 1 tablet (6.25 mg total) by mouth 2 (two) times daily with a meal.  . Cholecalciferol 2000 units CAPS Take 1 capsule (2,000 Units total) by mouth daily.  Marland Kitchen. guaiFENesin (ROBITUSSIN) 100 MG/5ML SOLN Take 10 mLs by mouth every 4 (four) hours as needed for cough or to loosen phlegm.  . hydrochlorothiazide (HYDRODIURIL) 12.5 MG tablet Take 1 tablet (12.5 mg total) by mouth daily.  . magnesium hydroxide (MILK OF MAGNESIA) 400 MG/5ML suspension Take 30 mLs by mouth daily as needed for mild constipation.  . Menthol, Topical Analgesic, (BIOFREEZE) 4 % GEL Apply 1 application topically 4 (four) times daily as needed (pain).  . nitroGLYCERIN (NITROSTAT) 0.4 MG SL tablet Place 0.4 mg under the tongue every 5 (five) minutes as needed for chest pain.  . rivaroxaban (XARELTO) 20 MG TABS tablet Take 1 tablet (20 mg total) by mouth daily with supper.  . Sodium Phosphates (RA SALINE ENEMA) 19-7 GM/118ML ENEM Place 1 each rectally as needed (for constipation).   No facility-administered encounter medications on file as of 03/30/2016.    Allergies  Allergen Reactions  . Aspirin Other (See Comments)    Bleeding   . Latex Itching  . Penicillins Rash  Has patient had a PCN reaction causing immediate rash, facial/tongue/throat swelling, SOB or lightheadedness with hypotension: No Has patient had a PCN reaction causing severe rash involving mucus membranes or skin necrosis: No Has patient had a PCN reaction that required hospitalization No Has patient had a PCN reaction occurring within the last 10 years: No If all of the above answers are  "NO", then may proceed with Cephalosporin use.    History Patient Active Problem List   Diagnosis Date Noted  . Infection due to Norovirus species   . Influenza with respiratory manifestation   . Acute respiratory failure with hypoxia and hypercapnia (HCC)   . Diarrhea   . SOB (shortness of breath)   . Influenza-like symptoms 03/23/2016  . Acute cystitis without hematuria   . 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 Hypoxia 01/11/2016  . Pulmonary embolism with acute cor pulmonale (HCC) 01/11/2016  . Pressure injury of skin 01/11/2016  . Prediabetes 01/11/2016  . Thoracic aortic aneurysm (HCC) 01/11/2016  . 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 Pulmonary nodules/lesions, multiple   . Anxiety state 09/30/2015  . Weight gain 08/24/2015  . Pruritic intertrigo 07/24/2015  . Hypercalcemia 05/18/2015  . Osteoarthritis 03/11/2015  . Anemia 03/11/2015  . Bone spur of left foot   . 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 Person living in residential institution 12/03/2014  . Diabetes mellitus without complication (HCC) 11/29/2014  . Acute delirium 11/29/2014  . Hyperlipemia   . Essential hypertension   . Fall   . Solitary pulmonary nodule, possible 08/29/2014   Past Medical History:  Diagnosis Date  . Bone spur of left foot   . Depression   . Diabetes mellitus   . Diabetes mellitus without complication (HCC) 11/29/2014  . Essential hypertension   . Fall   . 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 Gout   . Hemorrhoid   . Hypercalcemia 05/18/2015  . Hyperlipemia   . Hypertension   . Iron deficiency anemia secondary to blood loss (chronic)   . 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 Obesity   . Osteoarthritis   . Person living in residential institution 12/03/2014  . Prediabetes 01/11/2016  . 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 Pulmonary nodules/lesions, multiple   . Solitary pulmonary nodule  01/07/2015  . Thoracic aortic aneurysm (HCC) 01/11/2016  . UTI (lower urinary tract infection) 11/29/2014   Past Surgical History:  Procedure Laterality Date  . KNEE SURGERY Right    Family History  Problem Relation Age of Onset  . Heart attack Mother   . Diabetes Father     reports that she has quit smoking. She does not have any smokeless tobacco history on file.  Basic Activities of Daily Living   ADLs Independent Needs Assistance Dependent  Bathing   x  Dressing   x  Ambulation   x  Toileting   x  Eating   x     Instrumental Activities of Daily Living  IADL Independent Needs Assistance Dependent  Cooking   x  Housework   x  Manage Medications   x  Manage the telephone   x  Shopping for food, clothes, Meds, etc   x  Use transportation   x  Manage Finances   x    Diet:  DYS 2  Review of Systems  Patient has ability to communicate answers to ROS: minimally See HPI  Pain:  No  Dyspnea: Dyspnea  Rating: 2 = some difficulty, noticeable to observer Dyspnea Goal: none Dyspnea Therapies: albuterol HCTZ 12.5mg  mg every day Dyspnea Response to Therapies: fair   General: Denies fevers, chills, weight loss, fatigue, weight gain.  Eyes: Denies pain, blurred vision  Ears/Nose/Throat: Denies ear pain, throat pain, rhinorrhea, nasal congestion.  Cardiovascular: Denies chest pains, palpitations, dyspnea on exertion, orthopnea, peripheral edema.  Respiratory: Denies cough, sputum, dyspnea.  Gastrointestinal: Denies abd pain, bloating, constipation, diarrhea.  Genitourinary: Denies dysuria, urinary frequency, discharge Musculoskeletal: Denies joint pain, swelling, weakness.  Skin: Denies skin rash or ulcers. Neurologic: Denies transient paralysis, weakness, paresthesias, headache.  Psychiatric: Denies depression, anxiety, psychosis. Endocrine: Denies weight change.   PHYSICAL EXAM:. Wt Readings from Last 3 Encounters:  03/29/16 149 lb (67.6 kg)  01/12/16 179 lb 14.3  oz (81.6 kg)  08/24/15 180 lb (81.6 kg)   Temp Readings from Last 3 Encounters:  03/30/16 99.1 F (37.3 C)  03/29/16 98.2 F (36.8 C) (Oral)  03/23/16 99.2 F (37.3 C)   BP Readings from Last 3 Encounters:  03/30/16 140/82  03/29/16 (!) 154/86  03/23/16 110/65   Pulse Readings from Last 3 Encounters:  03/30/16 92  03/29/16 98  03/23/16 (!) 110    General: alert, delirious, no distress, appears older than stated age, well nourished, pleasant, clean, groomed HEENT:  No scleral icterus, no nasal secretions, [place]romucosa mildly dry (mouth breathing); no erythema or lesion Neck:  Supple CV:  RRR,  no ankle swelling RESP: Upper airway sounds transmitted, Normal WOB on room air, no rales, no rhonchi.  ABD:  Soft, generalized tenderness, non-distended, +bowel sounds, no masses, no peritoneal signs MSK:  Contractures of LE Gait:  Not tested and bedbound Skin: bilateral foot ulcers Neurologic: not tested 2/2 delirium Psych:  Orientation to self, states she is at [place].  Attention Decreased;  Mood agitated; Speech normal; Language none ; Thought perseverating  Assessment and Plan:   Family communication: [name] (son) [phone] Code Status:    DNR on our chart but admitted as FULL code to this hospital and subsequently admitted as FULL code at transfer back to Nursing home.  Will need to readdress this once her delirium resolves  Intravenous Fluids:  Yes Feeding Tubes: Yes Antibiotics: YEs Hospitalization: Yes Emergency contact:  [name]   Follow Up:  Next 2 days unless acute issues arise.    1. Person living in residential institution - Ad[facility] back to [facility] - Resume PT/OT services as indicated  2. Acute delirium.  Likely related to recent illness/ hospitalization.  She also has moved rooms which may be contributing.   - Monitor closely  3. Essential hypertension - Coreg increased to 6.25mg  bid in hospital.  Patient's heart rate is tolerating - HCTZ 12.5mg  added  during hospitalization, as her BP was not at goal w/ h/o thoracic aortic aneurysm - repeat BMP, verbal order given to RN Regina  4. Influenza with respiratory manifestation s/p Tamiflu treatment x5d - continues to have upper airway congestion and transmission of sounds.  Afebrile with normal O2 saturation - Continue albuterol 2 puffs q6 prn wheeze/ sob  5. Unstageable pressure ulcer, unspecified location (HCC) - wound care following  6. Diarrhea of presumed infectious origin - improving - BMP ordered to assess potassium - continue supportive care/ hydration  7. Infection due to Norovirus species - as above   Ashly M. Nadine Counts, DO PGY-3, [facility] Residency

## 2016-04-01 ENCOUNTER — Non-Acute Institutional Stay: Payer: Medicare Other | Admitting: Family Medicine

## 2016-04-01 DIAGNOSIS — R062 Wheezing: Secondary | ICD-10-CM | POA: Diagnosis not present

## 2016-04-01 DIAGNOSIS — R532 Functional quadriplegia: Secondary | ICD-10-CM

## 2016-04-01 DIAGNOSIS — I712 Thoracic aortic aneurysm, without rupture, unspecified: Secondary | ICD-10-CM

## 2016-04-01 DIAGNOSIS — R410 Disorientation, unspecified: Secondary | ICD-10-CM | POA: Diagnosis not present

## 2016-04-01 DIAGNOSIS — R918 Other nonspecific abnormal finding of lung field: Secondary | ICD-10-CM | POA: Diagnosis not present

## 2016-04-01 NOTE — Progress Notes (Signed)
[facility] Acute Visit  [name] is alone Sources of clinical information for visit is/are nursing home, past medical records and inpatient team.  HISTORY OF PRESENT ILLNESS: [name] is a 76 y.o.  female.    No chief complaint on file.   HPI: [name] is 76 y.o. female that is s/p discharge from hospital on Tues for flu+, norovirus +.  Mentation has improved compared to Wednesday exam.  Patient now aware that she is at [facility] facility and recognizes that she is in a different room than previous.   Discussed care with RN on floor, as I noticed that there is now O2 via [place] on patient.  RN was under the impression that [facility].  She is unaware of any desaturations overnight.  Discussed care with hospital team and patient was not discharged on continuous O2, as she had no O2 requirement at time of d/c.    Today, she is oriented to self and place, though seems to wax and wane.    History Patient Active Problem List   Diagnosis Date Noted  . Infection due to Norovirus species   . Influenza with respiratory manifestation   . Acute respiratory failure with hypoxia and hypercapnia (HCC)   . Diarrhea   . SOB (shortness of breath)   . Influenza-like symptoms 03/23/2016  . Acute cystitis without hematuria   . 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 Hypoxia 01/11/2016  . Pulmonary embolism with acute cor pulmonale (HCC) 01/11/2016  . Pressure injury of skin 01/11/2016  . Prediabetes 01/11/2016  . Thoracic aortic aneurysm (HCC) 01/11/2016  . 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 Pulmonary nodules/lesions, multiple   . Anxiety state 09/30/2015  . Weight gain 08/24/2015  . Pruritic intertrigo 07/24/2015  . Hypercalcemia 05/18/2015  . Osteoarthritis 03/11/2015  . Anemia 03/11/2015  . Bone spur of left foot   . 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 Person living in residential institution 12/03/2014  . Diabetes mellitus without  complication (HCC) 11/29/2014  . Acute delirium 11/29/2014  . Hyperlipemia   . Essential hypertension   . Fall   . Solitary pulmonary nodule, possible 08/29/2014     Medications  Current Outpatient Prescriptions:  .  acetaminophen (TYLENOL) 650 MG CR tablet, Take 650-1,300 mg by mouth See admin instructions. Take 650 mg at 10 AM. Take 1300 mg at 6PM. Take 1300 mg at 2AM., Disp: , Rfl:  .  albuterol (ACCUNEB) 0.63 MG/3ML nebulizer solution, Take 1 ampule by nebulization every 4 (four) hours as needed for wheezing., Disp: , Rfl:  .  albuterol (PROVENTIL HFA;VENTOLIN HFA) 108 (90 BASE) MCG/ACT inhaler, Inhale 2 puffs into the lungs every 6 (six) hours as needed for wheezing or shortness of breath., Disp: 1 Inhaler, Rfl: 0 .  Amino Acids-Protein Hydrolys (FEEDING SUPPLEMENT, PRO-STAT SUGAR FREE 64,) LIQD, Take 30 mLs by mouth 3 (three) times daily between meals., Disp: 900 mL, Rfl: 0 .  bisacodyl (DULCOLAX) 10 MG suppository, Place 10 mg rectally as needed for moderate constipation., Disp: , Rfl:  .  calcium citrate (CALCITRATE - DOSED IN MG ELEMENTAL CALCIUM) 950 MG tablet, Take 200 mg of elemental calcium by mouth daily., Disp: , Rfl:  .  carvedilol (COREG) 6.25 MG tablet, Take 1 tablet (6.25 mg total) by mouth 2 (two) times daily with a meal., Disp: 30 tablet, Rfl: 0 .  Cholecalciferol 2000 units CAPS, Take 1 capsule (2,000 Units total) by mouth daily., Disp:  30 each, Rfl: 0 .  guaiFENesin (ROBITUSSIN) 100 MG/5ML SOLN, Take 10 mLs by mouth every 4 (four) hours as needed for cough or to loosen phlegm., Disp: , Rfl:  .  hydrochlorothiazide (HYDRODIURIL) 12.5 MG tablet, Take 1 tablet (12.5 mg total) by mouth daily., Disp: 30 tablet, Rfl: 0 .  magnesium hydroxide (MILK OF MAGNESIA) 400 MG/5ML suspension, Take 30 mLs by mouth daily as needed for mild constipation., Disp: , Rfl:  .  Menthol, Topical Analgesic, (BIOFREEZE) 4 % GEL, Apply 1 application topically 4 (four) times daily as needed (pain).,  Disp: , Rfl:  .  nitroGLYCERIN (NITROSTAT) 0.4 MG SL tablet, Place 0.4 mg under the tongue every 5 (five) minutes as needed for chest pain., Disp: , Rfl:  .  rivaroxaban (XARELTO) 20 MG TABS tablet, Take 1 tablet (20 mg total) by mouth daily with supper., Disp: 71 tablet, Rfl: 0 .  Sodium Phosphates (RA SALINE ENEMA) 19-7 GM/118ML ENEM, Place 1 each rectally as needed (for constipation)., Disp: , Rfl:    There were no vitals filed for this visit.  Wt Readings from Last 3 Encounters:  03/29/16 149 lb (67.6 kg)  01/12/16 179 lb 14.3 oz (81.6 kg)  08/24/15 180 lb (81.6 kg)   PHYSICAL EXAM:. General: No acute distress, pleasant, frail female HEENT:  No scleral icterus, no nasal secretions CV:  RRR RESP: No resp distress but has slight belly breathing.  No accessory muscle use.  She is currently on O2 via [place].  She has scant intermittent global expiratory wheeze anteriorly. Psych: AO x self and place (requires me to ask question twice before she can answer), tangential thought, perserates on "people lying to her"  Assessment(s):    1. Wheeze.  Likely as a result of recent influenza illness.  However, she is at increased risk of pna.  Albuterol nebs ordered q6 prn - CXR ordered - CBC w/ diff ordered - Care discussed with inpatient upper level resident  2. Delirium, improving but still waxing and waning mentation.  She has been AOx2 twice today for me but assessed by Dr Randolm IdolFletke shortly before lunch and was not oriented. - monitor closely   Code Status:     Code Status History    Date Active Date Inactive Code Status Order ID Comments User Context   03/23/2016  7:53 PM 03/29/2016  9:33 PM Full Code [id number]  Araceli Bouchealeigh N Rumley, DO ED   01/11/2016  9:11 AM 01/13/2016  7:03 PM Full Code [id number]  Raliegh IpAshly M Forest Redwine, DO Inpatient   12/01/2014  9:56 AM 12/02/2014  6:20 PM Full Code [id number]  Elease EtienneAnand D Hongalgi, MD Inpatient   11/30/2014  8:45 AM 12/01/2014  9:56 AM DNR [id number]  Elease EtienneAnand D Hongalgi, MD  Inpatient   11/29/2014  3:00 AM 11/30/2014  8:45 AM Full Code [id number]  Lorretta HarpXilin Niu, MD ED        Rozell SearingAshly M. Nadine CountsGottschalk, DO PGY-3, [facility]

## 2016-04-01 NOTE — Progress Notes (Signed)
FMTS Attending Note  I personally saw and evaluated the patient. The plan of care was discussed with the resident team. I agree with the assessment and plan as documented by the resident.   Additionally:  Patient not able to provide history. Per RN staff - no loose stools, taking fluids, poor solid intake, followed by wounds RN for coccyx and bilateral foot wounds, does not normally need oxygen (currently 2 L).   1. Influenza/Respiratory symptoms: patient continues to need 2L [place] to maintain oxygen saturation in low 90's, lung exam clear except for wheezes, ordered albuterol nebs prn.   2. Norovirus/Diarrhea: resolved, has poor appetite but taking fluids, monitor  3. Delirium: Oriented only to self, likely related to above issues, no other current disruptive behavioral, monitor at this time, suspect will improve as she recovers from recent illness.   4. Coccyx Ulcer, Bilateral medial malleolus ulcers: no evidence of active infectious process, continue local wound care.   5. Hx. PE - on Xarelto, CTA 03/23/16 negative for acute PE, no other acute cardiopulmonary process identified.   Donnella ShamKyle Rashada Klontz MD

## 2016-04-05 ENCOUNTER — Encounter: Payer: Self-pay | Admitting: Family Medicine

## 2016-04-05 DIAGNOSIS — R532 Functional quadriplegia: Secondary | ICD-10-CM

## 2016-04-05 HISTORY — DX: Functional quadriplegia: R53.2

## 2016-04-05 NOTE — Progress Notes (Signed)
I have interviewed and examined the patient.  I have discussed the case and verified the key findings with Dr. Nadine CountsGottschalk.   I agree with their assessments and plans as documented in their visit note.    [facility] Acute Visit  Lynn KudoMina S Shiplett is alone Sources of clinical information for visit is/are patient, nursing home and past medical records.  HISTORY OF PRESENT ILLNESS: [name] is a 76 y.o.  female.    CC: Wheezing  HPI:    Wheezing - Recent hospitalization with Influenza and Norovirus - Has been running in 88 to 92% on room air for last - Productive Cough - No shortness of breath. No chills, no diarrhea.  -  Frailty - Refusing weights - Bedbound - sleeps throughout day - had weight loss #148 - Reduced oral intake. Only eating bites of meals. Is taking Ensure well which is offered three times a day.    Pressure Ulcers - Nonstageable, present prior to recento hospitalziation. - location left heel and right lateral foot  - Refusing wearing heel protection boots.  - Receiving Nurse Wound Care  Total Assist with all ADLs including feeding   History Patient Active Problem List   Diagnosis Date Noted  . Hypoxia 01/11/2016    Priority: High  . Pulmonary embolism with acute cor pulmonale (HCC) 01/11/2016    Priority: High  . Pressure ulcer of left foot, unstageable (HCC) 01/11/2016    Priority: High  . Thoracic aortic aneurysm (HCC) 01/11/2016    Priority: High  . Person living in residential institution 12/03/2014    Priority: High  . Diabetes mellitus without complication (HCC) 11/29/2014    Priority: High  . Multifocal atrial tachycardia (HCC) 01/12/2016    Priority: Medium  . 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 Pulmonary nodules/lesions, multiple     Priority: Medium  . Anxiety state 09/30/2015    Priority: Medium  . Essential hypertension     Priority: Medium  . Prediabetes 01/11/2016    Priority: Low  .  Osteoarthritis 03/11/2015    Priority: Low  . Hyperlipemia     Priority: Low     Medications  Current Outpatient Prescriptions:  .  acetaminophen (TYLENOL) 650 MG CR tablet, Take 650-1,300 mg by mouth See admin instructions. Take 650 mg at 10 AM. Take 1300 mg at 6PM. Take 1300 mg at 2AM., Disp: , Rfl:  .  albuterol (ACCUNEB) 0.63 MG/3ML nebulizer solution, Take 1 ampule by nebulization every 4 (four) hours as needed for wheezing., Disp: , Rfl:  .  albuterol (PROVENTIL HFA;VENTOLIN HFA) 108 (90 BASE) MCG/ACT inhaler, Inhale 2 puffs into the lungs every 6 (six) hours as needed for wheezing or shortness of breath., Disp: 1 Inhaler, Rfl: 0 .  Amino Acids-Protein Hydrolys (FEEDING SUPPLEMENT, PRO-STAT SUGAR FREE 64,) LIQD, Take 30 mLs by mouth 3 (three) times daily between meals., Disp: 900 mL, Rfl: 0 .  bisacodyl (DULCOLAX) 10 MG suppository, Place 10 mg rectally as needed for moderate constipation., Disp: , Rfl:  .  calcium citrate (CALCITRATE - DOSED IN MG ELEMENTAL CALCIUM) 950 MG tablet, Take 200 mg of elemental calcium by mouth daily., Disp: , Rfl:  .  carvedilol (COREG) 6.25 MG tablet, Take 1 tablet (6.25 mg total) by mouth 2 (two) times daily with a meal., Disp: 30 tablet, Rfl: 0 .  Cholecalciferol 2000 units CAPS, Take 1 capsule (2,000 Units total) by mouth daily., Disp: 30 each, Rfl: 0 .  guaiFENesin (  ROBITUSSIN) 100 MG/5ML SOLN, Take 10 mLs by mouth every 4 (four) hours as needed for cough or to loosen phlegm., Disp: , Rfl:  .  hydrochlorothiazide (HYDRODIURIL) 12.5 MG tablet, Take 1 tablet (12.5 mg total) by mouth daily., Disp: 30 tablet, Rfl: 0 .  magnesium hydroxide (MILK OF MAGNESIA) 400 MG/5ML suspension, Take 30 mLs by mouth daily as needed for mild constipation., Disp: , Rfl:  .  Menthol, Topical Analgesic, (BIOFREEZE) 4 % GEL, Apply 1 application topically 4 (four) times daily as needed (pain)., Disp: , Rfl:  .  nitroGLYCERIN (NITROSTAT) 0.4 MG SL tablet, Place 0.4 mg under the  tongue every 5 (five) minutes as needed for chest pain., Disp: , Rfl:  .  rivaroxaban (XARELTO) 20 MG TABS tablet, Take 1 tablet (20 mg total) by mouth daily with supper., Disp: 71 tablet, Rfl: 0 .  Sodium Phosphates (RA SALINE ENEMA) 19-7 GM/118ML ENEM, Place 1 each rectally as needed (for constipation)., Disp: , Rfl:    There were no vitals filed for this visit.  Wt Readings from Last 3 Encounters:  03/29/16 149 lb (67.6 kg)  01/12/16 179 lb 14.3 oz (81.6 kg)  08/24/15 180 lb (81.6 kg)     Review of Systems:  See HPI General: (+) loss of appetite  Ears/Nose/Throat: Denies throat pain Cardiovascular: Denies chest pains Respiratory: Denies cough  Gastrointestinal: Denies diarrhea.  Genitourinary: Denies dysuria   PHYSICAL EXAM:. General: VS reviewed. Low air-loss mattress, lying in bed asleep with chin to chest, torso flexed forward in bed.  No distresss. Laying in leg sway position to right HEENT:   no nasal secretions, Poor dentition CV:  RRR, no murmur, no ankle swelling RESP: No resp distress or accessory muscle use.  Clear to ausc bilat. No wheezing, no  crackles  ABD:  Soft, Non-tender, non-distended, +bowel sounds,  Skin:  Thick eschar over medial left heel, thick eschar over lateral malleous on right Psych:  Oriented to place, slightly off on date,  Has insight into her situation, difficulty with short-term memory, speech clear, language concrete and reality based  Assessment(s):    1. Wheeze - Reolving Influenza infection on Pulmonary Hypertension - SaO2 92% on room air.  - Recovery phase of bronchial remodeling.  - Plan: supportive care, Dextramethorophan   2. Delirium - Due to acute illness and hospitalization. It is resolving. - Somnolence remains, mild attentional wandering, easily redirected. - Monitor   3. Frailty Phenotype - Declining cognitive and physical functioning. - Will monitor for how well her function recovers after this recent severe illness.   If remains at current functional level, will address declining prognosis and responsiveness of cardiopulmonary resuscitation.  Code Status:     Code Status History    Date Active Date Inactive Code Status Order ID Comments User Context   03/23/2016  7:53 PM 03/29/2016  9:33 PM Full Code [id number]  Araceli Bouche, DO ED   01/11/2016  9:11 AM 01/13/2016  7:03 PM Full Code [id number]  Raliegh Ip, DO Inpatient   12/01/2014  9:56 AM 12/02/2014  6:20 PM Full Code [id number]  Elease Etienne, MD Inpatient   11/30/2014  8:45 AM 12/01/2014  9:56 AM DNR [id number]  Elease Etienne, MD Inpatient   11/29/2014  3:00 AM 11/30/2014  8:45 AM Full Code [id number]  Lorretta Harp, MD ED        There are no discontinued medications.

## 2016-04-11 ENCOUNTER — Encounter: Payer: Self-pay | Admitting: Family Medicine

## 2016-04-11 LAB — BASIC METABOLIC PANEL
BUN: 20 mg/dL (ref 4–21)
CALCIUM: 10.2 mg/dL
CHLORIDE: 95 mmol/L
CREATINE, SERUM: 0.28
Carbon Dioxide, Total: 33 — AB (ref 22–31)
EGFR (Non-African Amer.): >90
Glucose: 88
Osmolality: 283 — AB (ref 285–295)
POTASSIUM: 3.9 mmol/L
SODIUM: 141

## 2016-04-13 ENCOUNTER — Non-Acute Institutional Stay (INDEPENDENT_AMBULATORY_CARE_PROVIDER_SITE_OTHER): Payer: Medicare Other | Admitting: Family Medicine

## 2016-04-13 ENCOUNTER — Telehealth: Payer: Self-pay | Admitting: Internal Medicine

## 2016-04-13 DIAGNOSIS — R410 Disorientation, unspecified: Secondary | ICD-10-CM

## 2016-04-13 DIAGNOSIS — R0902 Hypoxemia: Secondary | ICD-10-CM | POA: Diagnosis not present

## 2016-04-13 DIAGNOSIS — F411 Generalized anxiety disorder: Secondary | ICD-10-CM | POA: Diagnosis not present

## 2016-04-13 DIAGNOSIS — R918 Other nonspecific abnormal finding of lung field: Secondary | ICD-10-CM | POA: Diagnosis not present

## 2016-04-13 DIAGNOSIS — L89890 Pressure ulcer of other site, unstageable: Secondary | ICD-10-CM | POA: Diagnosis not present

## 2016-04-13 NOTE — Telephone Encounter (Signed)
[facility] to let us know the results of patient's stat labs and CXR: -Hgb 11.3, Hct 35.2 -CXR: trace bronchiolitis may be present, persistent calcification of RUL, no effusion, no edema, improved aeration of the right lung.  No new orders at this time.  Will forward to PCP and [facility] resident.  Willadean CarolKaty Teylor Wolven, MD

## 2016-04-13 NOTE — Progress Notes (Signed)
Patient ID: [name], female   DOB: Sep 13, 1940, 76 y.o.   [id number] [facility]  [facility]  Primary Care Provider: Rodrigo Ranrystal Dorsey, MD [facility] Visit Information: a scheduled routine follow-up visit Patient accompanied by no one Source(s) of information for visit: patient and EMR   Chief Complaint:     Since our last visit, the patient has been hospitalized for increased WOB. During that hospitalization she was found to have Influenza A and Norovirus. 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   Since her discharge she's been noted to be intermittently delirious and intermittently on supplemental O2 (it is not sure why but the patient states she needs it).   Today she tells me she believes her son, Reita ClicheBobby, has intermittently in the room but she cannot find him; however she feels his presence [name]([name] is alive, but to my knowledge has not visited recently). She notes her breathing is okay, but she sometimes coughs when it gets "low" which is why she feels people need to check it more often.  She notes she tries to eat because her son would be mad if she didn't eat enough.     Nutrition Concerns: patient has had decreased PO intake; she is supposed to be supplementing with Ensure but cannot tell me how often she drinks these.   Wound Care Nurse Concerns: currently has pressure ulcers on heels bilaterally, in soft boots to help remove pressure.    HISTORY OF PRESENT ILLNESS: Outpatient Encounter Prescriptions as of 04/13/2016  Medication Sig  . acetaminophen (TYLENOL) 650 MG CR tablet Take 650-1,300 mg by mouth See admin instructions. Take 650 mg at 10 AM. Take 1300 mg at [facility]6PM. Take 1300 mg at 2AM.  . albuterol (ACCUNEB) 0.63 MG/3ML nebulizer solution Take 1 ampule by nebulization every 4 (four) hours as needed for wheezing.  Marland Kitchen. albuterol (PROVENTIL HFA;VENTOLIN HFA) 108 (90 BASE) MCG/ACT inhaler Inhale 2 puffs  into the lungs every 6 (six) hours as needed for wheezing or shortness of breath.  . Amino Acids-Protein Hydrolys (FEEDING SUPPLEMENT, PRO-STAT SUGAR FREE 64,) LIQD Take 30 mLs by mouth 3 (three) times daily between meals.  . bisacodyl (DULCOLAX) 10 MG suppository Place 10 mg rectally as needed for moderate constipation.  . calcium citrate (CALCITRATE - DOSED IN MG ELEMENTAL CALCIUM) 950 MG tablet Take 200 mg of elemental calcium by mouth daily.  . carvedilol (COREG) 6.25 MG tablet Take 1 tablet (6.25 mg total) by mouth 2 (two) times daily with a meal.  . Cholecalciferol 2000 units CAPS Take 1 capsule (2,000 Units total) by mouth daily.  Marland Kitchen. guaiFENesin (ROBITUSSIN) 100 MG/5ML SOLN Take 10 mLs by mouth every 4 (four) hours as needed for cough or to loosen phlegm.  . hydrochlorothiazide (HYDRODIURIL) 12.5 MG tablet Take 1 tablet (12.5 mg total) by mouth daily.  . magnesium hydroxide (MILK OF MAGNESIA) 400 MG/5ML suspension Take 30 mLs by mouth daily as needed for mild constipation.  . Menthol, Topical Analgesic, (BIOFREEZE) 4 % GEL Apply 1 application topically 4 (four) times daily as needed (pain).  . nitroGLYCERIN (NITROSTAT) 0.4 MG SL tablet Place 0.4 mg under the tongue every 5 (five) minutes as needed for chest pain.  . rivaroxaban (XARELTO) 20 MG TABS tablet Take 1 tablet (20 mg total) by mouth daily with supper.  . Sodium Phosphates (RA SALINE ENEMA) 19-7 GM/118ML ENEM Place 1 each rectally as needed (for constipation).  No facility-administered encounter medications on file as of 04/13/2016.    Allergies  Allergen Reactions  . Aspirin Other (See Comments)    Bleeding   . Latex Itching  . Penicillins Rash    Has patient had a PCN reaction causing immediate rash, facial/tongue/throat swelling, SOB or lightheadedness with hypotension: No Has patient had a PCN reaction causing severe rash involving mucus membranes or skin necrosis: No Has patient had a PCN reaction that required  hospitalization No Has patient had a PCN reaction occurring within the last 10 years: No If all of the above answers are "NO", then may proceed with Cephalosporin use.    History Patient Active Problem List   Diagnosis Date Noted  . 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 Hypoxia 01/11/2016  . Pulmonary embolism with acute cor pulmonale (HCC) 01/11/2016  . Pressure ulcer of left foot, unstageable (HCC) 01/11/2016  . Prediabetes 01/11/2016  . Thoracic aortic aneurysm (HCC) 01/11/2016  . 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 Pulmonary nodules/lesions, multiple   . Anxiety state 09/30/2015  . Osteoarthritis 03/11/2015  . Person living in residential institution 12/03/2014  . Diabetes mellitus without complication (HCC) 11/29/2014  . Acute delirium 11/29/2014  . Hyperlipemia   . Essential hypertension    Past Medical History:  Diagnosis Date  . Acute cystitis without hematuria   . Acute delirium 11/29/2014  . Acute respiratory failure with hypoxia and hypercapnia (HCC)   . Anemia 03/11/2015  . Bone spur of left foot   . 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 Depression   . Diabetes mellitus   . Diabetes mellitus without complication (HCC) 11/29/2014  . Essential hypertension   . Fall   . 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 Gout   . Hemorrhoid   . Hypercalcemia 05/18/2015  . Hyperlipemia   . Hypertension   . Infection due to Norovirus species   . Influenza-like symptoms 03/23/2016  . Iron deficiency anemia secondary to blood loss (chronic)   . 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 Obesity   . Osteoarthritis   . Person living in residential institution 12/03/2014  . Prediabetes 01/11/2016  . Pruritic intertrigo 07/24/2015  . Pulmonary artery hypertension 01/11/2016   Chest CT angiogram 01/11/16 evidence PAH with Pulmonary Emboli   . Pulmonary nodules/lesions, multiple   . Solitary pulmonary nodule 01/07/2015  . Thoracic aortic aneurysm (HCC) 01/11/2016  . UTI (lower urinary tract infection) 11/29/2014   Past Surgical History:  Procedure Laterality Date  . KNEE SURGERY Right    Family History  Problem Relation Age of Onset  . Heart attack Mother   . Diabetes Father     reports that she has quit smoking. She does not have any smokeless tobacco history on file.    Diet:  Dysphagia 2 with Ensure supplements  Review of Systems  Patient has ability to communicate answers to ROS: yes, intermittently See HPI  Geriatric Syndromes: Constipation no  Incontinence yes, wears adult diapers Dizziness no   Syncope no   Skin problems yes- pressure ulcers   Visual Impairment no   Hearing impairment yes (stable) Eating impairment yes- dentures, poor dentition Impaired Memory or Cognition: intermittently delirious; sometimes unable to answer questions appropriately despite redirection.     Behavioral problems no Sleep problems no   Weight loss no    Pain:  Pain Location: in her legs bilaterally  Pain Rating: does not rate Pain Duration: for "some time" Pain Therapies: Tylenol, would like to know if this can be increased Pain Response to Therapies: okay Bowel Movement Difficulty: no  Dyspnea: Dyspnea Rating: denies currently Dyspnea Goal: none Dyspnea Therapies: albuterol nebs PRN; intermittently on supplemental O2 (has [place] set to 2L in her room, but is not in her nares- it is on the side of her face).  Dyspnea Response to Therapies: good   General: Denies fevers, chills, progressive fatigue, weight gain; noted to have weight loss over the last 6 months Eyes: Denies pain, blurred vision  Ears/Nose/Throat: Denies ear pain, throat pain, rhinorrhea, nasal congestion.  Cardiovascular: Denies chest pains, palpitations orthopnea, peripheral edema. Endorses intermittent SOB Respiratory: Denies sputum, sometimes  endorses SOB.  Gastrointestinal: Denies abdominal pain, bloating, constipation, diarrhea.  Genitourinary: Denies discharge Musculoskeletal: Endorses joint pain in the LE. Denies swelling  Skin: denies rashes.  Neurologic: Denies transient paralysis, weakness, paresthesias, headache.  Psychiatric: Denies depression, anxiety, psychosis.   Endocrine: unsure about weight/appetite  PHYSICAL EXAM:. Temperature 97.37F HR 83 RR 18 BP 131/80 O2 97%  Weights:  11/27/15: 162 lbs 02/24/16: 149.6 lbs 02/29/16: 148.8 lbs   General: alert, intermittently answers questions appropriately. Lying in bed in left lateral position with knees flexed and a pillow between knees. HENT:  No scleral icterus, no nasal secretions, Oral mucosa moist and no erythema or lesion. Does not have dentures in place today Eyes: cataract on the right  Neck:  Supple, No JVD, no lymphadenopathy CV:  RRR, no murmur, no thrill.  RESP: No resp distress or accessory muscle use.  Clear to ausc bilat. No wheezing, no rales, no rhonchi.  ABD:  Soft, non-distended, +bowel sounds. No tenderness to palpation.  Easily reducible umbilical hernia.  EXT: LE in soft boots bilaterally. Multiple thick eschars noted over the heels bilaterally, however pt resistant for full examination with taking off the boots at this time. Pulses WNL.  No swelling. Gait:  Not tested Neurologic: Mild muscle atrophy. Intermittently follows simple commands.  Motor Strength: generalized weakness, 4/5 strength in the hips bilaterally.   Psych:  Orientation: oriented to person, states it is the "year of Trump" but cannot elaborate further, month is December, cannot tell me date or day of week; Judgment impaired. Insight impaired; Memory impaired; Attention decreased;  Mood appropriate; Speech normal;  Thought tangential intermittently answering questions appropriately.    Assessment and Plan:   See Problem List for individual problem's assessment and plans.     Code Status:     FULL CODE

## 2016-04-14 NOTE — Assessment & Plan Note (Addendum)
Given thoracic aneurysm note on CTA, would like BP>130/80.  - carvedilol 6.25 mg BID and HCTZ 12.5 mg QD.

## 2016-04-14 NOTE — Assessment & Plan Note (Signed)
Most likely from PE (noted on CTA)

## 2016-04-14 NOTE — Assessment & Plan Note (Signed)
Continue with wound care

## 2016-04-14 NOTE — Assessment & Plan Note (Signed)
Most likely from recent illness/ hospitalization.  Patient with waxing and waning mental status. Medications less likely the cause. - continue to monitor closely - frequent orienting.

## 2016-04-14 NOTE — Assessment & Plan Note (Signed)
Adequate glycemic control off pharmacologic medications.  - intermittently check CBGs - no ASA due to h/o GI bleed - no ACE or ARB due to patient reported side effects

## 2016-04-14 NOTE — Assessment & Plan Note (Signed)
-   f/u CTA in 1 year (12/2016).

## 2016-04-14 NOTE — Assessment & Plan Note (Addendum)
Stable.  -Continue Tylenol

## 2016-04-14 NOTE — Assessment & Plan Note (Signed)
Incidentally noted on CTA. - repeat non-contrast CT in 3-6 months (around 03/2016 and 06/2016), then every 18-24 months if stable after that.

## 2016-04-14 NOTE — Assessment & Plan Note (Signed)
Asymptomatic currently. Currently on Xarelto 20mg  daily.

## 2016-04-14 NOTE — Assessment & Plan Note (Addendum)
Stable. Off Klonopin PRN after no improvement noted in hospital

## 2016-04-14 NOTE — Assessment & Plan Note (Addendum)
Most likely from recent influenza. S/p Tamiflu. Not hypoxic currently, but intermittently on supplemental O2 (seems like more for patient comfort although no in place appropriately). - continue to monitor O2 saturation. - CXR ordered however not yet completed as of my visit. - continue albuterol PRN

## 2016-04-15 ENCOUNTER — Encounter: Payer: Self-pay | Admitting: Family Medicine

## 2016-04-20 ENCOUNTER — Encounter: Payer: Self-pay | Admitting: Family Medicine

## 2016-04-20 ENCOUNTER — Non-Acute Institutional Stay: Payer: Medicare Other | Admitting: Family Medicine

## 2016-04-20 ENCOUNTER — Other Ambulatory Visit: Payer: Self-pay | Admitting: Family Medicine

## 2016-04-20 DIAGNOSIS — L249 Irritant contact dermatitis, unspecified cause: Secondary | ICD-10-CM | POA: Diagnosis not present

## 2016-04-20 NOTE — Progress Notes (Signed)
[facility] Acute Visit  [name] is alone Sources of clinical information for visit is/are patient and nursing home.  HISTORY OF PRESENT ILLNESS: [name] is a 76 y.o.  female.    No chief complaint on file.   HPI:  Patient reports rectal pain.  She denies nausea, vomiting.  RN reports that she is not eating as much as she usually does.  She reports that she has been desaturating on room air and therefore, [place] replaced.   History Patient Active Problem List   Diagnosis Date Noted  . 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 Hypoxia 01/11/2016  . Pulmonary embolism with acute cor pulmonale (HCC) 01/11/2016  . Pressure ulcer of left foot, unstageable (HCC) 01/11/2016  . Prediabetes 01/11/2016  . Thoracic aortic aneurysm (HCC) 01/11/2016  . 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 Pulmonary nodules/lesions, multiple   . Anxiety state 09/30/2015  . Osteoarthritis 03/11/2015  . Person living in residential institution 12/03/2014  . Diabetes mellitus without complication (HCC) 11/29/2014  . Acute delirium 11/29/2014  . Hyperlipemia   . Essential hypertension      Medications  Current Outpatient Prescriptions:  .  acetaminophen (TYLENOL) 650 MG CR tablet, Take 650-1,300 mg by mouth See admin instructions. Take 650 mg at 10 AM. Take 1300 mg at 6PM. Take 1300 mg at 2AM., Disp: , Rfl:  .  albuterol (ACCUNEB) 0.63 MG/3ML nebulizer solution, Take 1 ampule by nebulization every 4 (four) hours as needed for wheezing., Disp: , Rfl:  .  albuterol (PROVENTIL HFA;VENTOLIN HFA) 108 (90 BASE) MCG/ACT inhaler, Inhale 2 puffs into the lungs every 6 (six) hours as needed for wheezing or shortness of breath., Disp: 1 Inhaler, Rfl: 0 .  Amino Acids-Protein Hydrolys (FEEDING SUPPLEMENT, PRO-STAT SUGAR FREE 64,) LIQD, Take 30 mLs by mouth 3 (three) times daily between meals., Disp: 900 mL,  Rfl: 0 .  bisacodyl (DULCOLAX) 10 MG suppository, Place 10 mg rectally as needed for moderate constipation., Disp: , Rfl:  .  calcium citrate (CALCITRATE - DOSED IN MG ELEMENTAL CALCIUM) 950 MG tablet, Take 200 mg of elemental calcium by mouth daily., Disp: , Rfl:  .  carvedilol (COREG) 6.25 MG tablet, Take 1 tablet (6.25 mg total) by mouth 2 (two) times daily with a meal., Disp: 30 tablet, Rfl: 0 .  Cholecalciferol 2000 units CAPS, Take 1 capsule (2,000 Units total) by mouth daily., Disp: 30 each, Rfl: 0 .  guaiFENesin (ROBITUSSIN) 100 MG/5ML SOLN, Take 10 mLs by mouth every 4 (four) hours as needed for cough or to loosen phlegm., Disp: , Rfl:  .  hydrochlorothiazide (HYDRODIURIL) 12.5 MG tablet, Take 1 tablet (12.5 mg total) by mouth daily., Disp: 30 tablet, Rfl: 0 .  magnesium hydroxide (MILK OF MAGNESIA) 400 MG/5ML suspension, Take 30 mLs by mouth daily as needed for mild constipation., Disp: , Rfl:  .  Menthol, Topical Analgesic, (BIOFREEZE) 4 % GEL, Apply 1 application topically 4 (four) times daily as needed (pain)., Disp: , Rfl:  .  nitroGLYCERIN (NITROSTAT) 0.4 MG SL tablet, Place 0.4 mg under the tongue every 5 (five) minutes as needed for chest pain., Disp: , Rfl:  .  rivaroxaban (XARELTO) 20 MG TABS tablet, Take 1 tablet (20 mg total) by mouth daily with supper., Disp: 71 tablet, Rfl: 0 .  Sodium Phosphates (RA SALINE ENEMA) 19-7 GM/118ML ENEM, Place 1 each rectally as needed (for constipation)., Disp: ,  Rfl:    Vitals:   04/20/16 1609  BP: 120/74  Pulse: 68    Wt Readings from Last 3 Encounters:  03/29/16 149 lb (67.6 kg)  01/12/16 179 lb 14.3 oz (81.6 kg)  08/24/15 180 lb (81.6 kg)     Review of Systems:  Per HPI.  PHYSICAL EXAM:. General: No acute distress, nondiaphoretic,  HEENT:  MMM RESP: No resp distress or accessory muscle use on 2L O2 via [place]. GU: difficult exam secondary to pain with change in position.  No masses/ inflamed hemorrhoids/ fissures appreciated.      Assessment(s)/ Plan:  1. Irritant dermatitis.  No evidence of inflamed hemorrhoid, fissure, etc.  I suspect she is having chapping from stool/ urine/ lack of air flow as she wears adult diapers and is dependent upon nursing staff for changes of diapers. - Keep area dry - Zinco oxide to be applied to rectum with each adult diaper change. - Will follow up on Friday or sooner if needed.   Code Status:     Code Status History    Date Active Date Inactive Code Status Order ID Comments User Context   03/23/2016  7:53 PM 03/29/2016  9:33 PM Full Code [id number]  Araceli Bouchealeigh N Rumley, DO ED   01/11/2016  9:11 AM 01/13/2016  7:03 PM Full Code [id number]  Raliegh IpAshly M Sundi Slevin, DO Inpatient   12/01/2014  9:56 AM 12/02/2014  6:20 PM Full Code [id number]  Elease EtienneAnand D Hongalgi, MD Inpatient   11/30/2014  8:45 AM 12/01/2014  9:56 AM DNR [id number]  Elease EtienneAnand D Hongalgi, MD Inpatient   11/29/2014  3:00 AM 11/30/2014  8:45 AM Full Code [id number]  Lorretta HarpXilin Niu, MD ED        There are no discontinued medications.    Krista Som M. Nadine CountsGottschalk, DO PGY-3, [facility]

## 2016-04-20 NOTE — Progress Notes (Signed)
HCTZ discontinued for borderline BPs.  Continue Coreg BID.  Medications Discontinued During This Encounter  Medication Reason  . hydrochlorothiazide (HYDRODIURIL) 12.5 MG tablet Change in therapy   Nekia Maxham M. Nadine CountsGottschalk, DO PGY-3, [facility]

## 2016-04-22 ENCOUNTER — Encounter: Payer: Self-pay | Admitting: Family Medicine

## 2016-04-22 NOTE — Addendum Note (Signed)
Addended byPerley Jain: Shep Porter D on: 04/22/2016 10:13 AM   Modules accepted: Level of Service

## 2016-05-12 ENCOUNTER — Non-Acute Institutional Stay: Payer: Medicare Other | Admitting: Family Medicine

## 2016-05-12 DIAGNOSIS — K429 Umbilical hernia without obstruction or gangrene: Secondary | ICD-10-CM | POA: Diagnosis not present

## 2016-05-12 DIAGNOSIS — R10815 Periumbilic abdominal tenderness: Secondary | ICD-10-CM | POA: Diagnosis not present

## 2016-05-12 DIAGNOSIS — R0989 Other specified symptoms and signs involving the circulatory and respiratory systems: Secondary | ICD-10-CM

## 2016-05-12 DIAGNOSIS — R079 Chest pain, unspecified: Secondary | ICD-10-CM | POA: Diagnosis not present

## 2016-05-13 ENCOUNTER — Encounter: Payer: Self-pay | Admitting: Family Medicine

## 2016-05-13 NOTE — Addendum Note (Signed)
Addended by: Acquanetta BellingMCDIARMID, Donisha Hoch D on: 05/13/2016 02:37 PM   Modules accepted: Level of Service

## 2016-05-13 NOTE — Progress Notes (Signed)
[facility] Acute Visit  Gita KudoMina S Prinsen is alone Sources of clinical information for visit is/are patient and nursing home.  HISTORY OF PRESENT ILLNESS: Gita KudoMina S Meadow is a 76 y.o.  female.    CC: Anxiety, Chest Pain  HPI:   Went to evaluate Mrs. Ourada concerning increasing agitation and chest pain. Per nursing, she has been more agitated today and complained of chest pain twice resolved with Nitro. Further discussion with nursing revealed it was very warm in Mrs. Eddie's room and they believed this contributed to her agitation--improved with fans and turning up the air conditioner. On evaluation, Mrs. Colon BranchCarson denies any current chest pain, palpitations, or shortness of breath. Reports chest pain reported earlier was really umbilical pain from a hernia.   History Patient Active Problem List   Diagnosis Date Noted  . 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 Hypoxia 01/11/2016  . Pulmonary embolism with acute cor pulmonale (HCC) 01/11/2016  . Pressure ulcer of left foot, unstageable (HCC) 01/11/2016  . Prediabetes 01/11/2016  . Thoracic aortic aneurysm (HCC) 01/11/2016  . 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 Pulmonary nodules/lesions, multiple   . Anxiety state 09/30/2015  . Osteoarthritis 03/11/2015  . Person living in residential institution 12/03/2014  . Diabetes mellitus without complication (HCC) 11/29/2014  . Acute delirium 11/29/2014  . Hyperlipemia   . Essential hypertension      Medications  Current Outpatient Prescriptions:  .  acetaminophen (TYLENOL) 650 MG CR tablet, Take 650-1,300 mg by mouth See admin instructions. Take 650 mg at 10 AM. Take 1300 mg at 6PM. Take 1300 mg at 2AM., Disp: , Rfl:  .  albuterol (ACCUNEB) 0.63 MG/3ML nebulizer solution, Take 1 ampule by nebulization every 4 (four) hours as needed for wheezing., Disp: , Rfl:  .  albuterol (PROVENTIL  HFA;VENTOLIN HFA) 108 (90 BASE) MCG/ACT inhaler, Inhale 2 puffs into the lungs every 6 (six) hours as needed for wheezing or shortness of breath., Disp: 1 Inhaler, Rfl: 0 .  Amino Acids-Protein Hydrolys (FEEDING SUPPLEMENT, PRO-STAT SUGAR FREE 64,) LIQD, Take 30 mLs by mouth 3 (three) times daily between meals., Disp: 900 mL, Rfl: 0 .  bisacodyl (DULCOLAX) 10 MG suppository, Place 10 mg rectally as needed for moderate constipation., Disp: , Rfl:  .  calcium citrate (CALCITRATE - DOSED IN MG ELEMENTAL CALCIUM) 950 MG tablet, Take 200 mg of elemental calcium by mouth daily., Disp: , Rfl:  .  carvedilol (COREG) 6.25 MG tablet, Take 1 tablet (6.25 mg total) by mouth 2 (two) times daily with a meal., Disp: 30 tablet, Rfl: 0 .  Cholecalciferol 2000 units CAPS, Take 1 capsule (2,000 Units total) by mouth daily., Disp: 30 each, Rfl: 0 .  guaiFENesin (ROBITUSSIN) 100 MG/5ML SOLN, Take 10 mLs by mouth every 4 (four) hours as needed for cough or to loosen phlegm., Disp: , Rfl:  .  magnesium hydroxide (MILK OF MAGNESIA) 400 MG/5ML suspension, Take 30 mLs by mouth daily as needed for mild constipation., Disp: , Rfl:  .  Menthol, Topical Analgesic, (BIOFREEZE) 4 % GEL, Apply 1 application topically 4 (four) times daily as needed (pain)., Disp: , Rfl:  .  nitroGLYCERIN (NITROSTAT) 0.4 MG SL tablet, Place 0.4 mg under the tongue every 5 (five) minutes as needed for chest pain., Disp: , Rfl:  .  rivaroxaban (XARELTO) 20 MG TABS tablet, Take 1 tablet (20 mg total) by mouth daily with supper., Disp: 71  tablet, Rfl: 0 .  Sodium Phosphates (RA SALINE ENEMA) 19-7 GM/118ML ENEM, Place 1 each rectally as needed (for constipation)., Disp: , Rfl:  .  Zinc Oxide 16 % OINT, Apply 1 application topically. With each diaper change, Disp: , Rfl:    There were no vitals filed for this visit.  Wt Readings from Last 3 Encounters:  03/29/16 149 lb (67.6 kg)  01/12/16 179 lb 14.3 oz (81.6 kg)  08/24/15 180 lb (81.6 kg)      Review of Systems:  PER HPI  PHYSICAL EXAM:. General: No acute distress, well nourished, pleasant Neck:  Supple, no lymphadenopathy CV:  RRR, no murmur, no ankle swelling RESP: No resp distress or accessory muscle use.  Crackles noted in left base. ABD:  Soft, non-distended, bowel sounds normal, umbilical hernia noted reducible MSK:  No back pain, no joint pain.  No joint swelling or redness Skin:  No significant skin lesions or rash  Assessment(s):    Abnormal Lung Exam (crackles of left base), Umbilical Hernia  Plan(s): 1.  Will obtain CXR  2.  Monitor hernia and further episodes of pain  Code Status:     Code Status History    Date Active Date Inactive Code Status Order ID Comments User Context   03/23/2016  7:53 PM 03/29/2016  9:33 PM Full Code [id number]  Araceli Bouche, DO ED   01/11/2016  9:11 AM 01/13/2016  7:03 PM Full Code [id number]  Raliegh Ip, DO Inpatient   12/01/2014  9:56 AM 12/02/2014  6:20 PM Full Code [id number]  Elease Etienne, MD Inpatient   11/30/2014  8:45 AM 12/01/2014  9:56 AM DNR [id number]  Elease Etienne, MD Inpatient   11/29/2014  3:00 AM 11/30/2014  8:45 AM Full Code [id number]  Lorretta Harp, MD ED

## 2016-05-13 NOTE — Progress Notes (Addendum)
I have interviewed and examined the patient with Dr Caroleen Hammanumley.   I agree with their assessments and plans as documented in their visit note.  This is a new problem with an uncertain diagnosis. Await CXR result and monitor for development of more specific findings to guide workup.

## 2016-05-17 ENCOUNTER — Telehealth: Payer: Self-pay | Admitting: Family Medicine

## 2016-05-17 NOTE — Telephone Encounter (Signed)
[facility].  VSS, afebrile.  Increasing cough with mucous production x1-2 days.  OK given for 2v CXR.  Erasmo DownerAngela M Rusty Villella, MD, MPH PGY-3,  [facility] 05/17/2016 10:25 PM

## 2016-05-18 ENCOUNTER — Non-Acute Institutional Stay: Payer: Medicare Other | Admitting: Family Medicine

## 2016-05-18 DIAGNOSIS — E860 Dehydration: Secondary | ICD-10-CM | POA: Diagnosis not present

## 2016-05-20 ENCOUNTER — Encounter: Payer: Self-pay | Admitting: Family Medicine

## 2016-05-20 ENCOUNTER — Encounter: Payer: Self-pay | Admitting: Pharmacist

## 2016-05-20 LAB — URINALYSIS
BLOOD UA: NEGATIVE
LEUKOCYTES UA: NEGATIVE
NITRITE URINE, QUANTITATIVE: NEGATIVE
Specific Gravity, UA: 1.015
Urine Glucose: NEGATIVE

## 2016-05-20 LAB — CBC
HCT: 96 %
Hemoglobin: 11.3
PLATELETS: 311
RBC: 3.68
WBC: 10.1

## 2016-05-20 LAB — BASIC METABOLIC PANEL
BUN: 45 mg/dL — AB (ref 4–21)
CHLORIDE: 92 mmol/L
CO2: 33
Calcium: 10.4 mg/dL — AB (ref 8.6–10.2)
Creat: 0.47
EGFR (Non-African Amer.): >90
Glucose: 113
Osmolality: 289.7
POTASSIUM: 4.3 mmol/L
Sodium: 139

## 2016-05-23 NOTE — Addendum Note (Signed)
Addended byPerley Jain: Louellen Haldeman D on: 05/23/2016 11:24 AM   Modules accepted: Level of Service

## 2016-05-23 NOTE — Progress Notes (Signed)
[facility] Acute Visit  [name] is alone Sources of clinical information for visit is/are patient and nursing home.  HISTORY OF PRESENT ILLNESS: [name] is a 76 y.o.  female.    No chief complaint on file.   HPI:   Poor historian given altered mental state with suspected delirium and underlying dementia. Notes occasional pain to left axilla and breast. Denies shortness of breath. Would like something to drink. No fevers noted. Per nursing, becomes agitated when room becomes too warm, but improves with fans.  History Patient Active Problem List   Diagnosis Date Noted  . 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 Hypoxia 01/11/2016  . Pulmonary embolism with acute cor pulmonale (HCC) 01/11/2016  . Pressure ulcer of left foot, unstageable (HCC) 01/11/2016  . Prediabetes 01/11/2016  . Thoracic aortic aneurysm (HCC) 01/11/2016  . 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 Pulmonary nodules/lesions, multiple   . Anxiety state 09/30/2015  . Osteoarthritis 03/11/2015  . Person living in residential institution 12/03/2014  . Diabetes mellitus without complication (HCC) 11/29/2014  . Acute delirium 11/29/2014  . Hyperlipemia   . Essential hypertension      Medications  Current Outpatient Prescriptions:  .  acetaminophen (TYLENOL) 650 MG CR tablet, Take 650 mg by mouth every morning. Take 650 mg at 10 AM. Take 1300 mg at 6PM. Take 1300 mg at 2AM., Disp: , Rfl:  .  albuterol (ACCUNEB) 0.63 MG/3ML nebulizer solution, Take 1 ampule by nebulization every 4 (four) hours as needed for wheezing., Disp: , Rfl:  .  albuterol (PROVENTIL HFA;VENTOLIN HFA) 108 (90 BASE) MCG/ACT inhaler, Inhale 2 puffs into the lungs every 6 (six) hours as needed for wheezing or shortness of breath., Disp: 1 Inhaler, Rfl: 0 .  albuterol (PROVENTIL) (2.5 MG/3ML) 0.083% nebulizer solution, Take 2.5 mg by  nebulization every 6 (six) hours as needed for wheezing or shortness of breath., Disp: , Rfl:  .  Amino Acids-Protein Hydrolys (FEEDING SUPPLEMENT, PRO-STAT SUGAR FREE 64,) LIQD, Take 30 mLs by mouth 3 (three) times daily between meals., Disp: 900 mL, Rfl: 0 .  bisacodyl (DULCOLAX) 10 MG suppository, Place 10 mg rectally as needed for moderate constipation., Disp: , Rfl:  .  calcium citrate (CALCITRATE - DOSED IN MG ELEMENTAL CALCIUM) 950 MG tablet, Take 200 mg of elemental calcium by mouth daily., Disp: , Rfl:  .  carvedilol (COREG) 6.25 MG tablet, Take 1 tablet (6.25 mg total) by mouth 2 (two) times daily with a meal., Disp: 30 tablet, Rfl: 0 .  Cholecalciferol 2000 units CAPS, Take 1 capsule (2,000 Units total) by mouth daily., Disp: 30 each, Rfl: 0 .  guaiFENesin (ROBITUSSIN) 100 MG/5ML SOLN, Take 10 mLs by mouth every 4 (four) hours as needed for cough or to loosen phlegm., Disp: , Rfl:  .  magnesium hydroxide (MILK OF MAGNESIA) 400 MG/5ML suspension, Take 30 mLs by mouth daily as needed for mild constipation., Disp: , Rfl:  .  Menthol, Topical Analgesic, (BIOFREEZE) 4 % GEL, Apply 1 application topically 4 (four) times daily as needed (pain)., Disp: , Rfl:  .  Multiple Vitamins-Iron (MULTIVITAMINS WITH IRON) TABS tablet, Take 1 tablet by mouth daily., Disp: , Rfl:  .  nitroGLYCERIN (NITROSTAT) 0.4 MG SL tablet, Place 0.4 mg under the tongue every 5 (five) minutes as needed for chest pain., Disp: , Rfl:  .  rivaroxaban (XARELTO) 20 MG TABS tablet, Take 1  tablet (20 mg total) by mouth daily with supper., Disp: 71 tablet, Rfl: 0 .  Sodium Phosphates (RA SALINE ENEMA) 19-7 GM/118ML ENEM, Place 1 each rectally as needed (for constipation)., Disp: , Rfl:  .  Zinc Oxide 16 % OINT, Apply 1 application topically. With each diaper change, Disp: , Rfl:   BP 150/88 HR 110 O2 Sat 98%  Wt Readings from Last 3 Encounters:  03/29/16 149 lb (67.6 kg)  01/12/16 179 lb 14.3 oz (81.6 kg)  08/24/15 180 lb  (81.6 kg)   Review of Systems:  Per HPI  PHYSICAL EXAM:. General: No acute distress, well nourished, pleasant HEENT:  No scleral icterus, no nasal secretions, dry mucous membranes CV:  RRR, no murmur, no ankle swelling, mild chestwall tenderness over left breast and axilla RESP: No resp distress or accessory muscle use.  Clear to ausc bilat. No wheezing, no rales, no rhonchi.  ABD:  Soft, Non-tender, non-distended, +bowel sounds, no masses MSK:  No back pain, no joint pain.  No joint swelling or redness Skin:  No significant skin lesions or rash  Assessment(s):   Dehydration:  Suspected given dry mucous membranes, elevated BP, and elevated HR  Plan(s): 1.  Encourage PO intake. At least 240cc per shift 2.  Continue to monitor  Code Status:     Code Status History    Date Active Date Inactive Code Status Order ID Comments User Context   03/23/2016  7:53 PM 03/29/2016  9:33 PM Full Code [id number]  Araceli Bouche, DO ED   01/11/2016  9:11 AM 01/13/2016  7:03 PM Full Code [id number]  Raliegh Ip, DO Inpatient   12/01/2014  9:56 AM 12/02/2014  6:20 PM Full Code [id number]  Elease Etienne, MD Inpatient   11/30/2014  8:45 AM 12/01/2014  9:56 AM DNR [id number]  Elease Etienne, MD Inpatient   11/29/2014  3:00 AM 11/30/2014  8:45 AM Full Code [id number]  Lorretta Harp, MD ED

## 2016-06-14 ENCOUNTER — Non-Acute Institutional Stay: Payer: Medicare Other | Admitting: Family Medicine

## 2016-06-14 DIAGNOSIS — R410 Disorientation, unspecified: Secondary | ICD-10-CM

## 2016-06-14 DIAGNOSIS — Z66 Do not resuscitate: Secondary | ICD-10-CM | POA: Diagnosis not present

## 2016-06-14 DIAGNOSIS — I2609 Other pulmonary embolism with acute cor pulmonale: Secondary | ICD-10-CM

## 2016-06-14 DIAGNOSIS — I10 Essential (primary) hypertension: Secondary | ICD-10-CM

## 2016-06-14 DIAGNOSIS — E119 Type 2 diabetes mellitus without complications: Secondary | ICD-10-CM | POA: Diagnosis not present

## 2016-06-14 DIAGNOSIS — M1990 Unspecified osteoarthritis, unspecified site: Secondary | ICD-10-CM

## 2016-06-14 DIAGNOSIS — I712 Thoracic aortic aneurysm, without rupture, unspecified: Secondary | ICD-10-CM

## 2016-06-14 DIAGNOSIS — L089 Local infection of the skin and subcutaneous tissue, unspecified: Secondary | ICD-10-CM

## 2016-06-14 DIAGNOSIS — L8990 Pressure ulcer of unspecified site, unspecified stage: Secondary | ICD-10-CM

## 2016-06-14 DIAGNOSIS — R918 Other nonspecific abnormal finding of lung field: Secondary | ICD-10-CM

## 2016-06-14 DIAGNOSIS — L89890 Pressure ulcer of other site, unstageable: Secondary | ICD-10-CM | POA: Diagnosis not present

## 2016-06-14 DIAGNOSIS — R532 Functional quadriplegia: Secondary | ICD-10-CM | POA: Diagnosis not present

## 2016-06-14 DIAGNOSIS — R0902 Hypoxemia: Secondary | ICD-10-CM | POA: Diagnosis not present

## 2016-06-14 NOTE — Progress Notes (Signed)
Patient ID: [name], female   DOB: 11-09-1940, 76 y.o.   MRN: [id number] [facility]  Visit  Primary Care Provider: Rodrigo Ran, MD Location of Care: [facility] Visit Information: a scheduled routine follow-up visit Patient accompanied by no one Source(s) of information for visit: patient and EMR   Chief Complaint:     Since our last visit, the patient has significantly declined. She is not as interactive. Her foot wounds have worsened and she has been evaluated by palliative.  Today, she asks if she can have an increase in Tylenol for her pain (she always asks me this).  She notes pain in legs and where wounds are. She also asks if she has been approved for eye surgery. She tells me she sees 4 of me. I cannot find a note from the eye doctor, however the nurse notes she was evaluated and they stated she may benefit from surgery.    Nutrition Concerns: patient has had decreased PO intake; she has orders for Ensure ad lib.   Wound Care Nurse Concerns: significant wounds on the LEs. Patient notes she has "lots of wounds" and they turn her regularly which is very uncomfortable.   HISTORY OF PRESENT ILLNESS: Outpatient Encounter Prescriptions as of 06/14/2016  Medication Sig  . acetaminophen (TYLENOL) 650 MG CR tablet Take 650 mg by mouth every morning. Take 650 mg at 10 AM. Take 1300 mg at [facility]. Take 1300 mg at 2AM.  . albuterol (ACCUNEB) 0.63 MG/3ML nebulizer solution Take 1 ampule by nebulization every 4 (four) hours as needed for wheezing.  Marland Kitchen albuterol (PROVENTIL HFA;VENTOLIN HFA) 108 (90 BASE) MCG/ACT inhaler Inhale 2 puffs into the lungs every 6 (six) hours as needed for wheezing or shortness of breath.  Marland Kitchen albuterol (PROVENTIL) (2.5 MG/3ML) 0.083% nebulizer solution Take 2.5 mg by nebulization every 6 (six) hours as needed for wheezing or shortness of breath.  . Amino Acids-Protein Hydrolys (FEEDING SUPPLEMENT, PRO-STAT SUGAR FREE 64,) LIQD Take 30 mLs by mouth 3  (three) times daily between meals.  . bisacodyl (DULCOLAX) 10 MG suppository Place 10 mg rectally as needed for moderate constipation.  . calcium citrate (CALCITRATE - DOSED IN MG ELEMENTAL CALCIUM) 950 MG tablet Take 200 mg of elemental calcium by mouth daily.  . carvedilol (COREG) 6.25 MG tablet Take 1 tablet (6.25 mg total) by mouth 2 (two) times daily with a meal.  . Cholecalciferol 2000 units CAPS Take 1 capsule (2,000 Units total) by mouth daily.  Marland Kitchen guaiFENesin (ROBITUSSIN) 100 MG/5ML SOLN Take 10 mLs by mouth every 4 (four) hours as needed for cough or to loosen phlegm.  . magnesium hydroxide (MILK OF MAGNESIA) 400 MG/5ML suspension Take 30 mLs by mouth daily as needed for mild constipation.  . Menthol, Topical Analgesic, (BIOFREEZE) 4 % GEL Apply 1 application topically 4 (four) times daily as needed (pain).  . Multiple Vitamins-Iron (MULTIVITAMINS WITH IRON) TABS tablet Take 1 tablet by mouth daily.  . nitroGLYCERIN (NITROSTAT) 0.4 MG SL tablet Place 0.4 mg under the tongue every 5 (five) minutes as needed for chest pain.  . rivaroxaban (XARELTO) 20 MG TABS tablet Take 1 tablet (20 mg total) by mouth daily with supper.  . Sodium Phosphates (RA SALINE ENEMA) 19-7 GM/118ML ENEM Place 1 each rectally as needed (for constipation).  . Zinc Oxide 16 % OINT Apply 1 application topically. With each diaper change   No facility-administered encounter medications on file as of 06/14/2016.    Allergies  Allergen Reactions  .  Aspirin Other (See Comments)    Bleeding   . Latex Itching  . Penicillins Rash    Has patient had a PCN reaction causing immediate rash, facial/tongue/throat swelling, SOB or lightheadedness with hypotension: No Has patient had a PCN reaction causing severe rash involving mucus membranes or skin necrosis: No Has patient had a PCN reaction that required hospitalization No Has patient had a PCN reaction occurring within the last 10 years: No If all of the above answers are  "NO", then may proceed with Cephalosporin use.    History Patient Active Problem List   Diagnosis Date Noted  . 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 Hypoxia 01/11/2016  . Pulmonary embolism with acute cor pulmonale (HCC) 01/11/2016  . Pressure ulcer of left foot, unstageable (HCC) 01/11/2016  . Prediabetes 01/11/2016  . Thoracic aortic aneurysm (HCC) 01/11/2016  . 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 Pulmonary nodules/lesions, multiple   . Anxiety state 09/30/2015  . Osteoarthritis 03/11/2015  . Person living in residential institution 12/03/2014  . Diabetes mellitus without complication (HCC) 11/29/2014  . Acute delirium 11/29/2014  . Hyperlipemia   . Essential hypertension    Past Medical History:  Diagnosis Date  . Acute cystitis without hematuria   . Acute delirium 11/29/2014  . Acute respiratory failure with hypoxia and hypercapnia (HCC)   . Anemia 03/11/2015  . Bone spur of left foot   . 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 Depression   . Diabetes mellitus   . Diabetes mellitus without complication (HCC) 11/29/2014  . Essential hypertension   . Fall   . 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 Gout   . Hemorrhoid   . Hypercalcemia 05/18/2015  . Hyperlipemia   . Hypertension   . Infection due to Norovirus species   . Influenza-like symptoms 03/23/2016  . Iron deficiency anemia secondary to blood loss (chronic)   . 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 Obesity   . Osteoarthritis   . Person living in residential institution 12/03/2014  . Prediabetes 01/11/2016  . Pruritic intertrigo 07/24/2015  . 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 Pulmonary nodules/lesions, multiple   . Solitary pulmonary nodule 01/07/2015  . Thoracic aortic aneurysm (HCC)  01/11/2016  . UTI (lower urinary tract infection) 11/29/2014   Past Surgical History:  Procedure Laterality Date  . KNEE SURGERY Right    Family History  Problem Relation Age of Onset  . Heart attack Mother   . Diabetes Father     reports that she has quit smoking. She has never used smokeless tobacco.    Diet:  Dysphagia 2 with Ensure supplements  Review of Systems  Patient has ability to communicate answers to ROS: yes, intermittently See HPI  Geriatric Syndromes: Constipation no  Incontinence yes, wears adult diapers Dizziness no   Syncope no   Skin problems yes- pressure ulcers and significant wounds Visual Impairment yes Hearing impairment yes (stable) Eating impairment yes- dentures, poor dentition Impaired Memory or Cognition: sometimes unable to answer questions appropriately despite redirection.     Behavioral problems no Sleep problems no   Weight loss no    Pain:  Pain Location: in her legs/fet bilaterally, buttocks Pain Rating: does not rate Pain Duration: for "some time" Pain Therapies: Tylenol, would like to know if this can be increased Pain Response  to Therapies: okay Bowel Movement Difficulty: no  Dyspnea: Dyspnea Rating: denies currently Dyspnea Goal: none Dyspnea Therapies: albuterol nebs PRN; on supplemental O2 since PE. Dyspnea Response to Therapies: good   General: Denies fevers, chills, weight gain; Endorses progressive fatigue, noted to have weight loss over the last 6 months Eyes: Denies pain, Endorses change in vision  Ears/Nose/Throat: Denies ear pain, throat pain, rhinorrhea, nasal congestion.  Cardiovascular: Denies chest pains, palpitations orthopnea, peripheral edema.  Respiratory: Denies sputum, sometimes endorses SOB (breathing comfortably currently on [place]) Gastrointestinal: Denies abdominal pain, bloating, constipation, diarrhea.  Genitourinary: Denies discharge Musculoskeletal: Endorses joint pain in the LE. Denies swelling   Skin: denies rashes. Endorses significant wounds  Neurologic: Denies transient paralysis, weakness, paresthesias, headache.  Psychiatric: Denies depression, anxiety, psychosis.   Endocrine: unsure about weight/appetite  PHYSICAL EXAM:.  General: alert, intermittently answers questions appropriately. Lying in bed in fetal position with knees flexed and a pillow between knees.  Malodorous.  Patient very resistant to me moving her as she was "just moved" and refuses to allow me to take down the dressings on her feet.  HENT:  No scleral icterus, no nasal secretions, Oral mucosa moist and no erythema or lesion. Does not have dentures in place today. [place] in place.  Eyes: cataracts.  Neck:  Supple, No JVD, no lymphadenopathy CV:  RRR, no murmur, no thrill.  RESP: No resp distress or accessory muscle use.  Clear to ausc bilat. No wheezing, no rales, no rhonchi. Speaks in full sentences. [place] in place. ABD:  Soft, non-distended, +bowel sounds. No tenderness to palpation from limited exam due to positioning.  EXT: noted to have some erythema/stage 1 pressure ulcer on left medial calf. Will not allow to me to examine further, does not want the dressings on her feet removed. Gait:  Not tested Neurologic: Mild muscle atrophy. Intermittently follows simple commands.  Motor Strength: unable to test.  Psych:  Orientation: oriented to person, Judgment impaired. Insight impaired; Memory impaired; Attention decreased;  Mood appropriate; Speech normal;  Thought tangential intermittently answering questions appropriately.    Assessment and Plan:   See Problem List for individual problem's assessment and plans.    Code Status:     Per Geriatrics attending on 06/16/16, now transitioned to DNR

## 2016-06-16 ENCOUNTER — Encounter: Payer: Self-pay | Admitting: Family Medicine

## 2016-06-16 DIAGNOSIS — Z66 Do not resuscitate: Secondary | ICD-10-CM

## 2016-06-16 HISTORY — DX: Do not resuscitate: Z66

## 2016-06-16 NOTE — Assessment & Plan Note (Signed)
-   repeat CTA in 1 yr from previous (12/2016)

## 2016-06-16 NOTE — ACP (Advance Care Planning) (Signed)
Patient (+) DNR by HCPOA, [name] Sexton (pt's son) by telephone conversation by Dr Kemari Mares with Mr Sexton on 06/15/16. 

## 2016-06-16 NOTE — ACP (Advance Care Planning) (Signed)
Patient (+) DNR by Rica MoteHCPOA, [name] Sexton (pt's son) by telephone conversation by Dr Urban Naval with Mr Henderson NewcomerSexton on 06/15/16.

## 2016-06-16 NOTE — Assessment & Plan Note (Signed)
On supplemental O2 currently

## 2016-06-16 NOTE — Assessment & Plan Note (Signed)
Foul odor noted. Patient would not allow my to examine the are.  Discussed with geriatric attending on 3/29 who started he visualized the area and was concerned for infection given purulent drainage, she was started on antibiotics

## 2016-06-16 NOTE — Assessment & Plan Note (Signed)
Initial goal <130/80 due to aneurysm, however given worsening status may liberalize this. - continue current regimen.

## 2016-06-16 NOTE — Progress Notes (Signed)
I have interviewed and examined the patient.  I have discussed the case and verified the key findings with Dr. Leonides Schanzorsey.   I agree with their assessments and plans as documented in their visit note. Consult note of 06/08/15 from Palliative Care service reviewed.  I spoke with patient's HCPOA by phone about his mother's condition,. Palliative care's assessment of limited life expectancy and my concurrence with their position.  A/ Delirium, ongoing  Pressure ulcers, unstageable - Left medial malleolus and right posterior heel - new purulent drainage from both wounds w/ R>L - rapid degeneration of wounds - Wounds with foul smell - Wound pain  Dementia - Anxious paranoia  - preceding involuntary weight loss and immobility  Possible depression vs delirium  Plan - Mrs Flinders's Avon GullyHCPOA (son), Fabian NovemberBobby Sexton has chosen to pursue comfort care with treatment of easily reversible conditions. - Mr Henderson NewcomerSexton has chosen to have his mother's code status be Do Not Resuscitate.  - Goal is to avoid hospitalization if possible so as to avoid stress of transfers on his mother.  - Continue handfeeding with giving patient time before stopping the process.  - Start of oral Antibiotics for wounds per Dr Caroleen Hammanumley.   - Limit blood draws. - Start Risperidal 0.25 mg tablet daily with titration to paranoia and expressions of fear.  - Discuss use of opiates or tramadol with son prior to starting. - (+) DNR status with paperwork signed.   - Stop inessential medications, emphasis on palliative medications.

## 2016-06-16 NOTE — Assessment & Plan Note (Signed)
Patient with episodes of intermittent delirium. Concerns for palliative for possible depression which could be mimicking dementia.  Consider starting mirtazapine qHS.

## 2016-06-16 NOTE — Assessment & Plan Note (Addendum)
Incidentally noted on CTA. - repeat non-contrast CT in 3-6 months (around 1/18-4/18), then every 18-24 months if stable after that)

## 2016-06-16 NOTE — Assessment & Plan Note (Signed)
Very concerned about [name]'s significant decline over the last few months. She has been non-ambulatory for over 1 year. She seemed to be quite immobile on my exam and would not move her UEs or LEs much. She is on supplemental oxygen. Her ulcers have worsened with concerns for overlying infection. All of this in combination with her poor PO intake and worsening status concerns me for a decreased chance of recovery from all of this.

## 2016-06-16 NOTE — Assessment & Plan Note (Signed)
Holding off on pharmacologic medications - no ASA due to h/o GI bleed - no ACE or ARB due to patient's reported side effects.

## 2016-06-16 NOTE — Assessment & Plan Note (Signed)
Currently on Xarelto. On supplemental oxygen

## 2016-06-16 NOTE — Assessment & Plan Note (Signed)
Stable.  - continue Tylenol, naproxen was added recently.

## 2016-07-15 ENCOUNTER — Other Ambulatory Visit: Payer: Self-pay | Admitting: Family Medicine

## 2016-07-15 DIAGNOSIS — Z515 Encounter for palliative care: Secondary | ICD-10-CM

## 2016-07-15 DIAGNOSIS — L89890 Pressure ulcer of other site, unstageable: Secondary | ICD-10-CM

## 2016-07-15 MED ORDER — OXYCODONE HCL 5 MG PO TABS: 5.0000 mg | ORAL_TABLET | ORAL | 0 refills | Status: AC | PRN

## 2016-07-15 NOTE — Progress Notes (Signed)
Prescription for oxycodone for end of life care fax'd to [facility].

## 2016-07-20 ENCOUNTER — Non-Acute Institutional Stay (INDEPENDENT_AMBULATORY_CARE_PROVIDER_SITE_OTHER): Payer: Medicare Other | Admitting: Family Medicine

## 2016-07-20 DIAGNOSIS — I2609 Other pulmonary embolism with acute cor pulmonale: Secondary | ICD-10-CM

## 2016-07-20 DIAGNOSIS — E119 Type 2 diabetes mellitus without complications: Secondary | ICD-10-CM

## 2016-07-20 DIAGNOSIS — Z593 Problems related to living in residential institution: Secondary | ICD-10-CM

## 2016-07-20 DIAGNOSIS — R532 Functional quadriplegia: Secondary | ICD-10-CM

## 2016-07-21 ENCOUNTER — Telehealth: Payer: Self-pay | Admitting: Family Medicine

## 2016-07-21 NOTE — Telephone Encounter (Signed)
[facility] and placed in PCP/Geri MD box for completion. Please complete and return to me.  Tia C Hill

## 2016-07-21 NOTE — Telephone Encounter (Signed)
Signed and returned to Tia.  Erasmo DownerAngela M Taye Cato, MD, MPH PGY-3,  [facility] 07/21/2016 8:41 AM

## 2016-08-19 NOTE — Progress Notes (Signed)
Family [facility] Death Summary  Patient name: [name] Medical record number: [id number] Date of birth: Apr 24, 1940 Age: 76 y.o. Gender: female Date of Admission: December 27, 2014   Date of Death: Aug 14, 2016 at 8:10am  Admitting Physician: No admitting provider for patient encounter.  Primary Care Provider: Rodrigo Ran, MD  Indication for Nursing Placement: debility after hospitalization for PE  Brief Hospital Course:  Patient was admitted to [facility] after hospitalization in 11/2014 for pneumonia.  She was later admitted to hospital in 03/2016 for flu, C diff and norovirus.  After that hospitalization, her health seemed to start a downward spiral.  After an episode of dehydration and delirium (which later cleared), she wasevaluated by palliative care.  She developed unstagable pressure wounds to both heels that became infected.  This infection was treated, but her health continued to worsen.  She was admitted to hospice 06/18/16.  Her symptoms were managed with the goal of comfort.  She stopped taking any PO 07/17/16.  She was then found pulseless and breathless by nursing staff at [facility] and pronounced dead at 8:10 am on 08/14/2016.  Erasmo Downer, MD 2016-08-14, 2:25 PM PGY-3, [facility]

## 2016-08-19 NOTE — Progress Notes (Signed)
Patient ID: [name], female   DOB: 12-13-1940, 76 y.o.   MRN: [id number] I discussed this patient's case with Dr. Beryle FlockBacigalupo  I agree with their assessment domented in their death summery below.   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

## 2016-08-19 DEATH — deceased

## 2017-08-25 IMAGING — CT CT HEAD W/O CM
2 series · 17 of 30 positions shown, 20 images · non-contrast
Comparison: None.

CLINICAL DATA: Weakness, confusion, and recent falls.

EXAM:
CT HEAD WITHOUT CONTRAST
TECHNIQUE: Contiguous axial images were obtained from the base of the skull
through the vertex without intravenous contrast.

[Series 2: head w/o · axial · non-contrast · 0.43mm/px · z∈[-154,-34]mm · 9 of 32 slices shown, 12 images]
[im 4/32  brain]
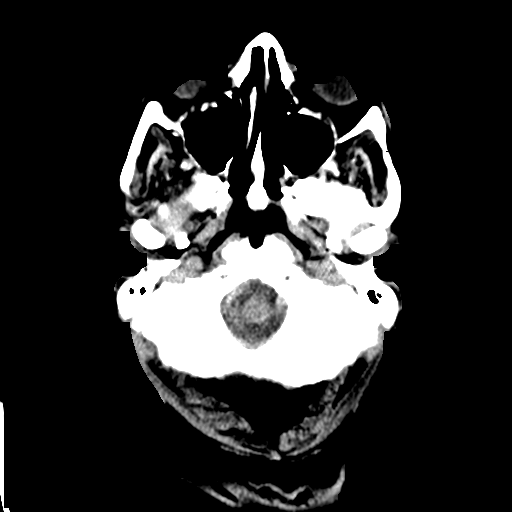
[im 4/32  bone]
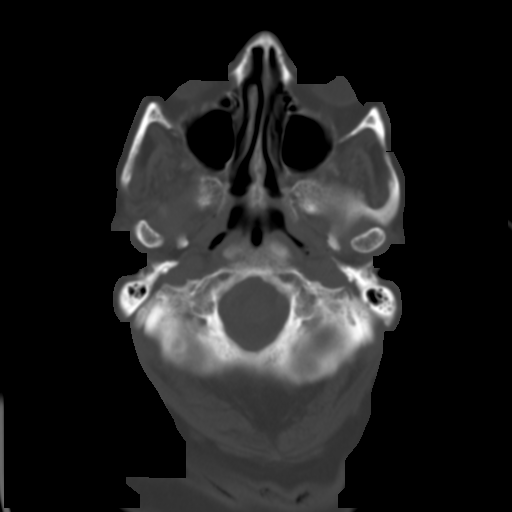
[im 7/32  brain]
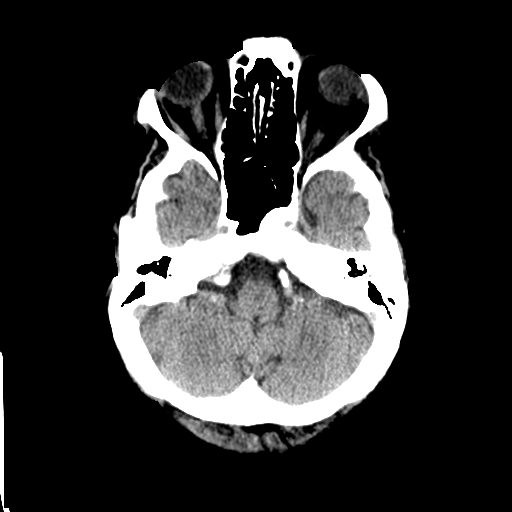
[im 10/32  brain]
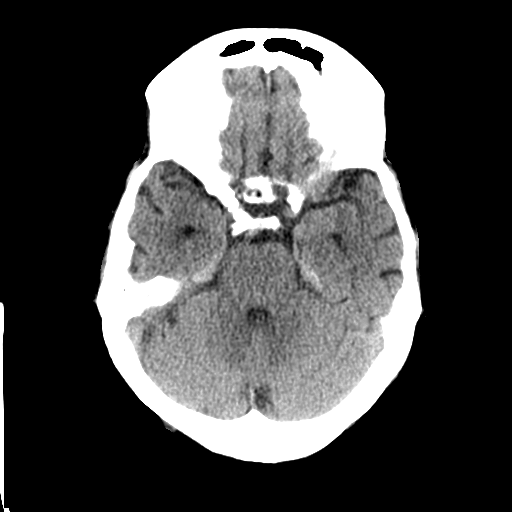
[im 13/32  brain]
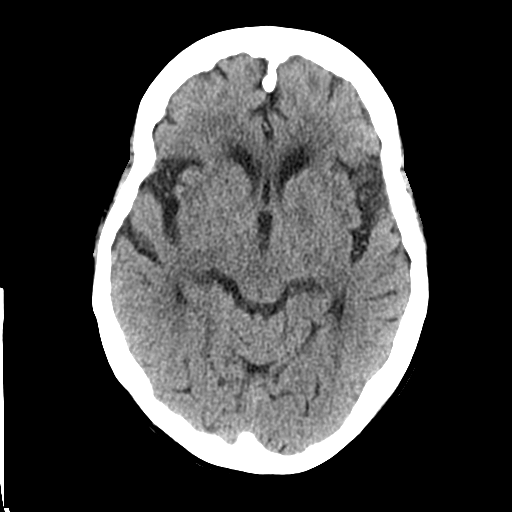
[im 16/32  brain]
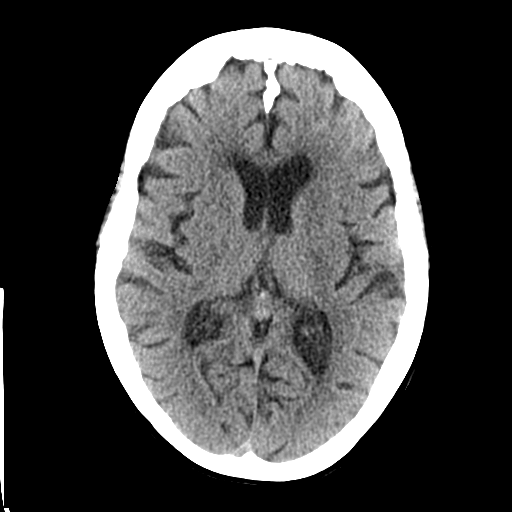
[im 16/32  bone]
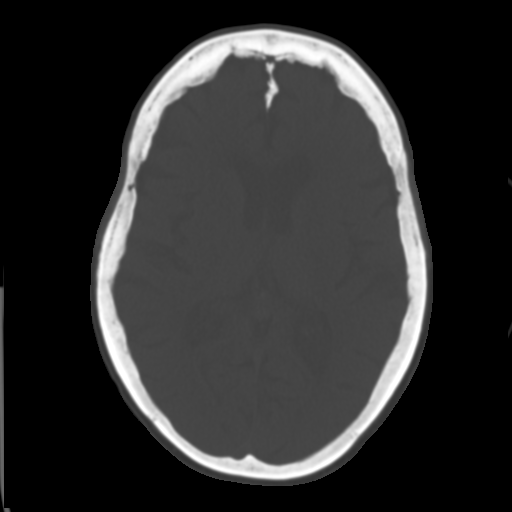
[im 19/32  brain]
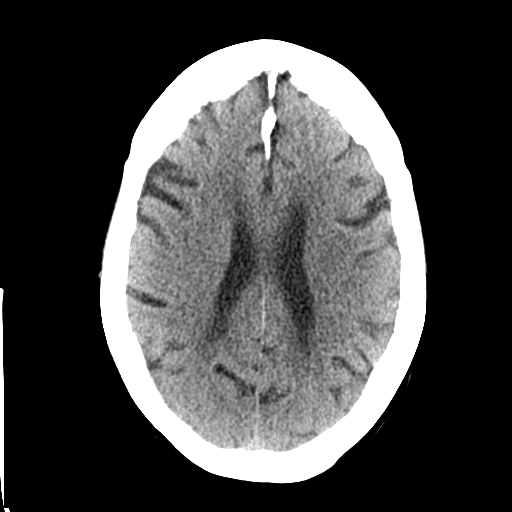
[im 22/32  brain]
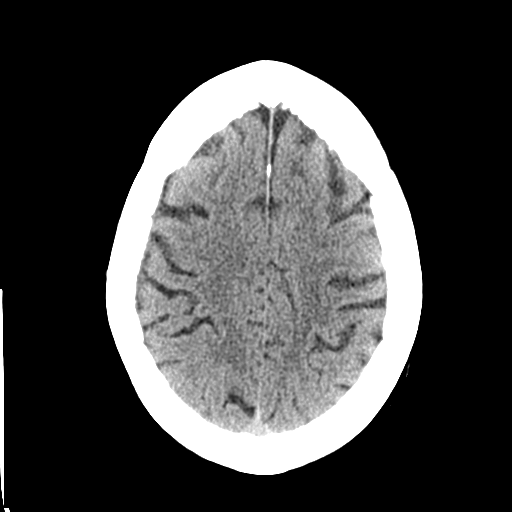
[im 25/32  brain]
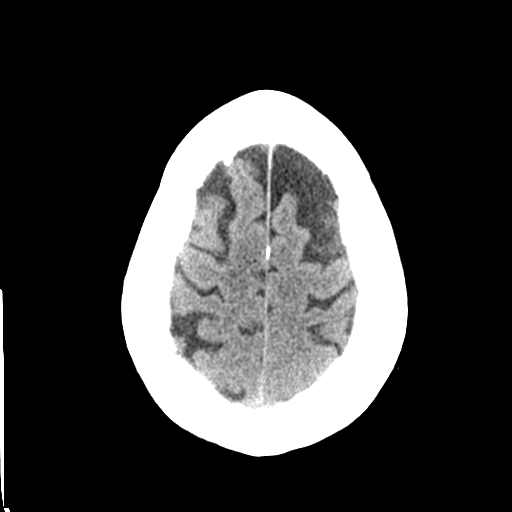
[im 28/32  brain]
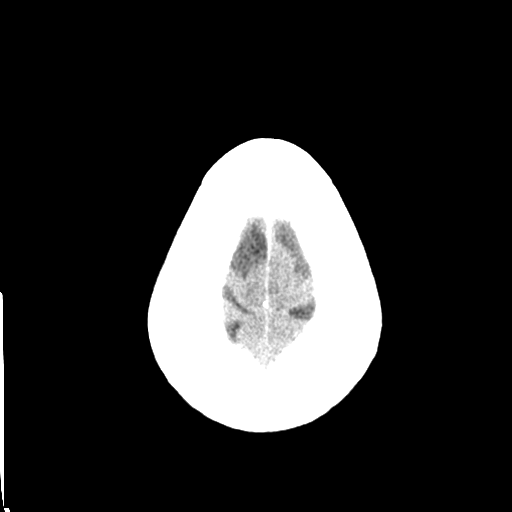
[im 28/32  bone]
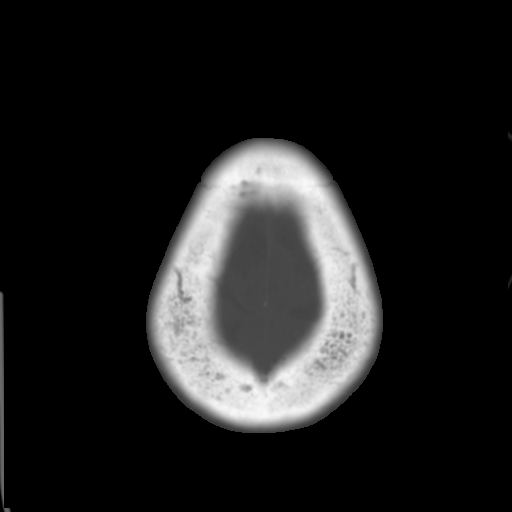

[Series 3: bone windows · axial · 0.43mm/px · z∈[-154,-31]mm · 8 of 53 slices shown]
[im 6/53  bone]
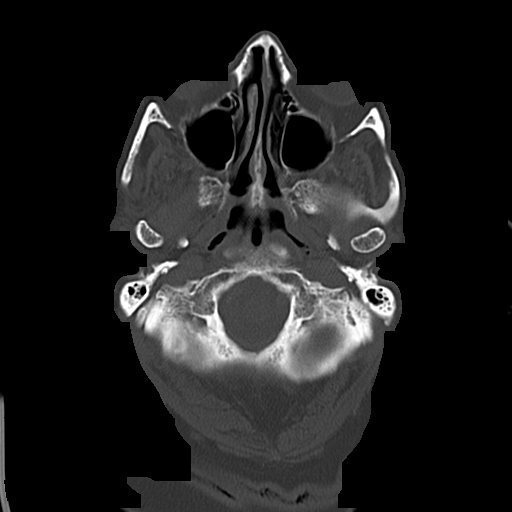
[im 12/53  bone]
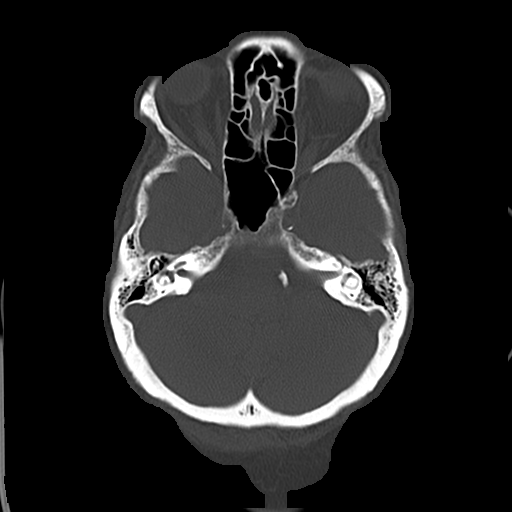
[im 18/53  bone]
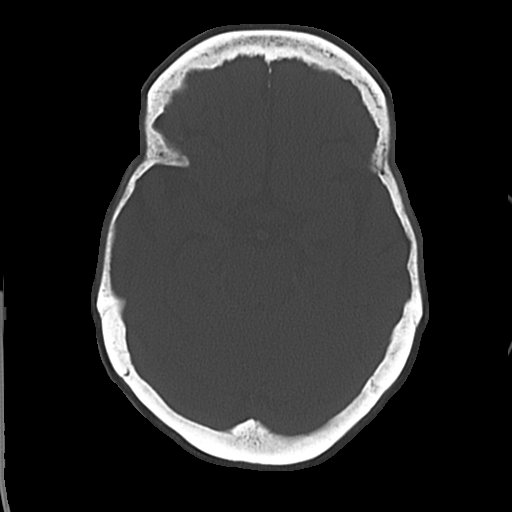
[im 24/53  bone]
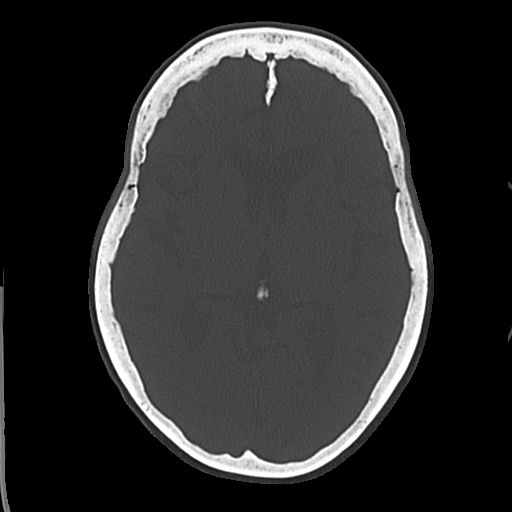
[im 29/53  bone]
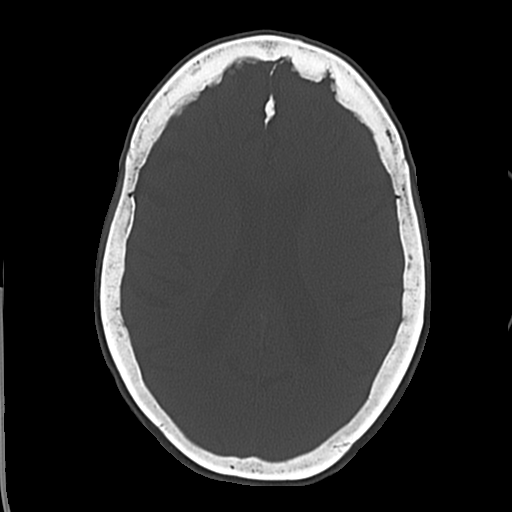
[im 35/53  bone]
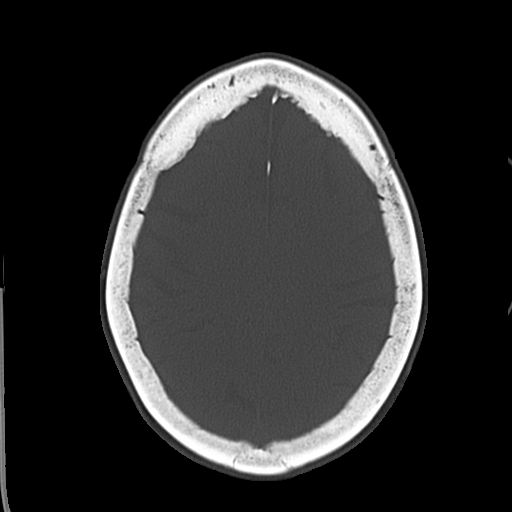
[im 41/53  bone]
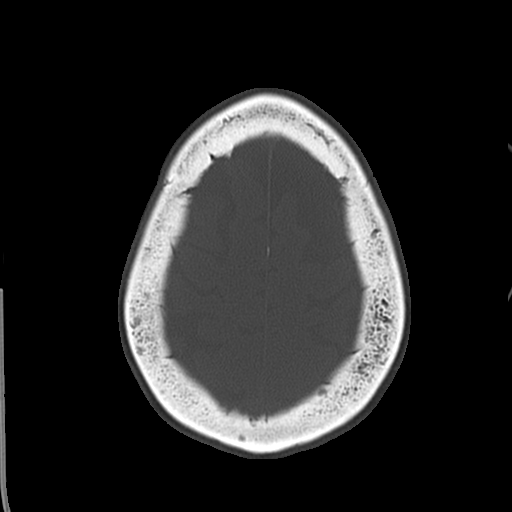
[im 47/53  bone]
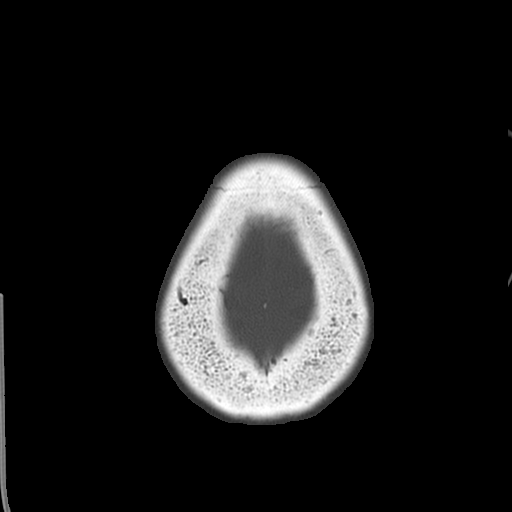

[17 of 30 positions shown; findings below may reference images not displayed]

FINDINGS: Mild diffuse cerebral atrophy. Patchy low-attenuation changes in the
deep white matter consistent with small vessel ischemia. No mass
effect or midline shift. No abnormal extra-axial fluid collections.
Gray-white matter junctions are distinct. Basal cisterns are not
effaced. No evidence of acute intracranial hemorrhage. No depressed
skull fractures. Visualized paranasal sinuses and mastoid air cells
are not opacified. Vascular calcifications.
IMPRESSION: No acute intracranial abnormalities. Chronic atrophy and small
vessel ischemic changes.

## 2017-08-26 IMAGING — DX DG ANKLE COMPLETE 3+V*L*
3 series · 3 of 3 positions shown · non-contrast
Comparison: None.

CLINICAL DATA: 74-year-old female with fall and ankle pain.

EXAM:
LEFT ANKLE COMPLETE - 3+ VIEW

[ankle ap]
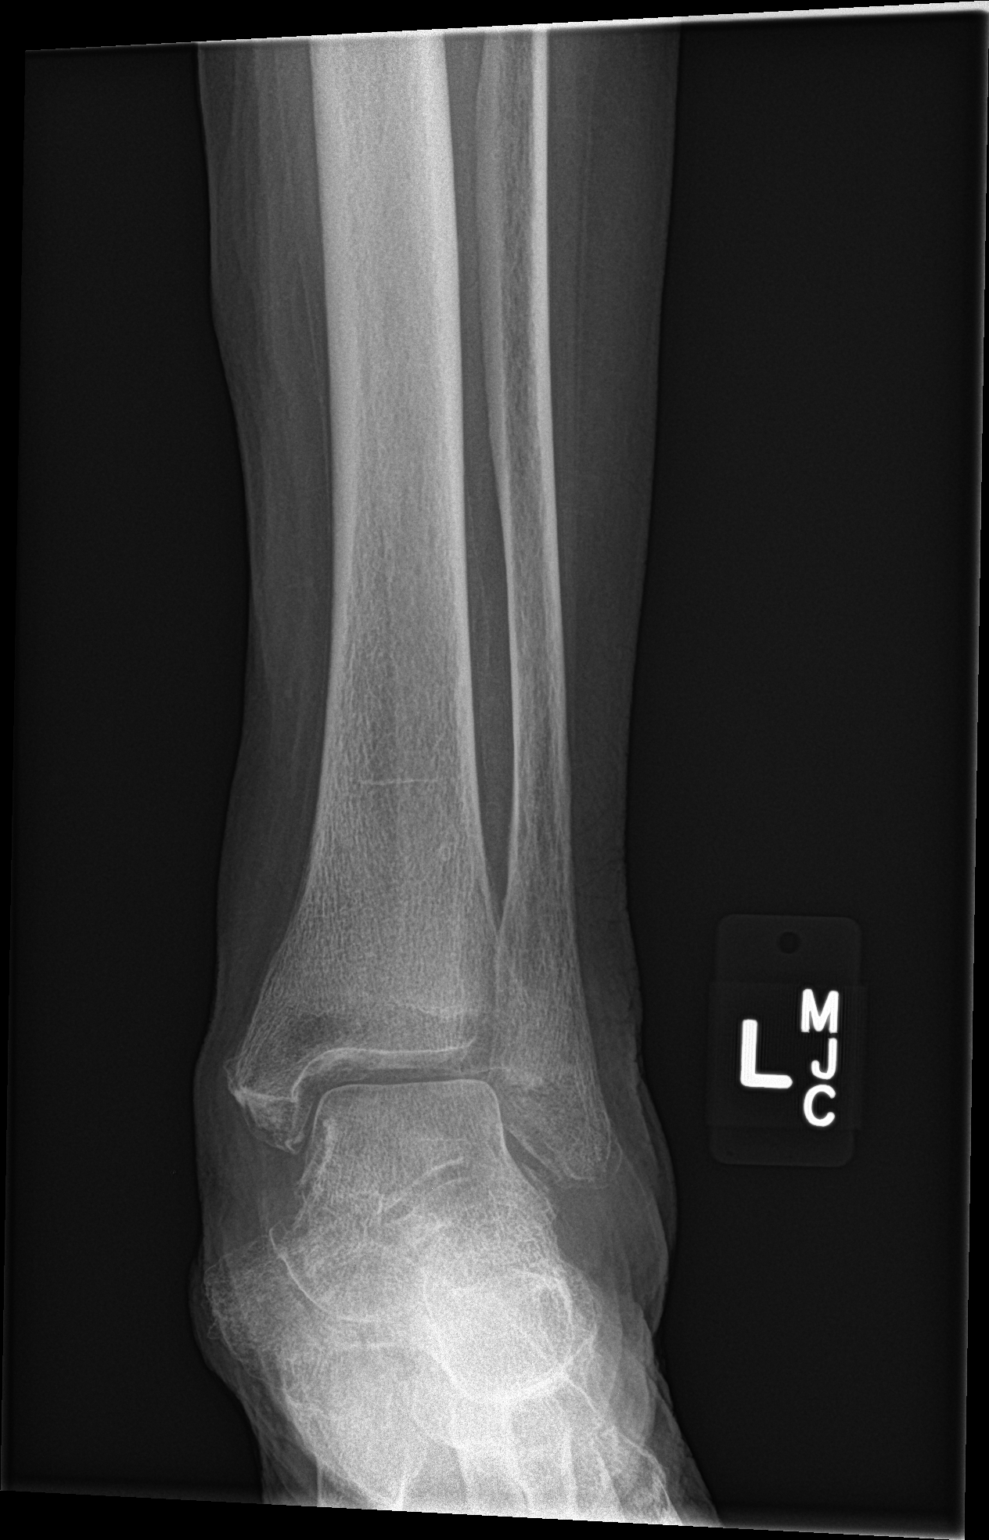

[ankle lat (1 of 2)]
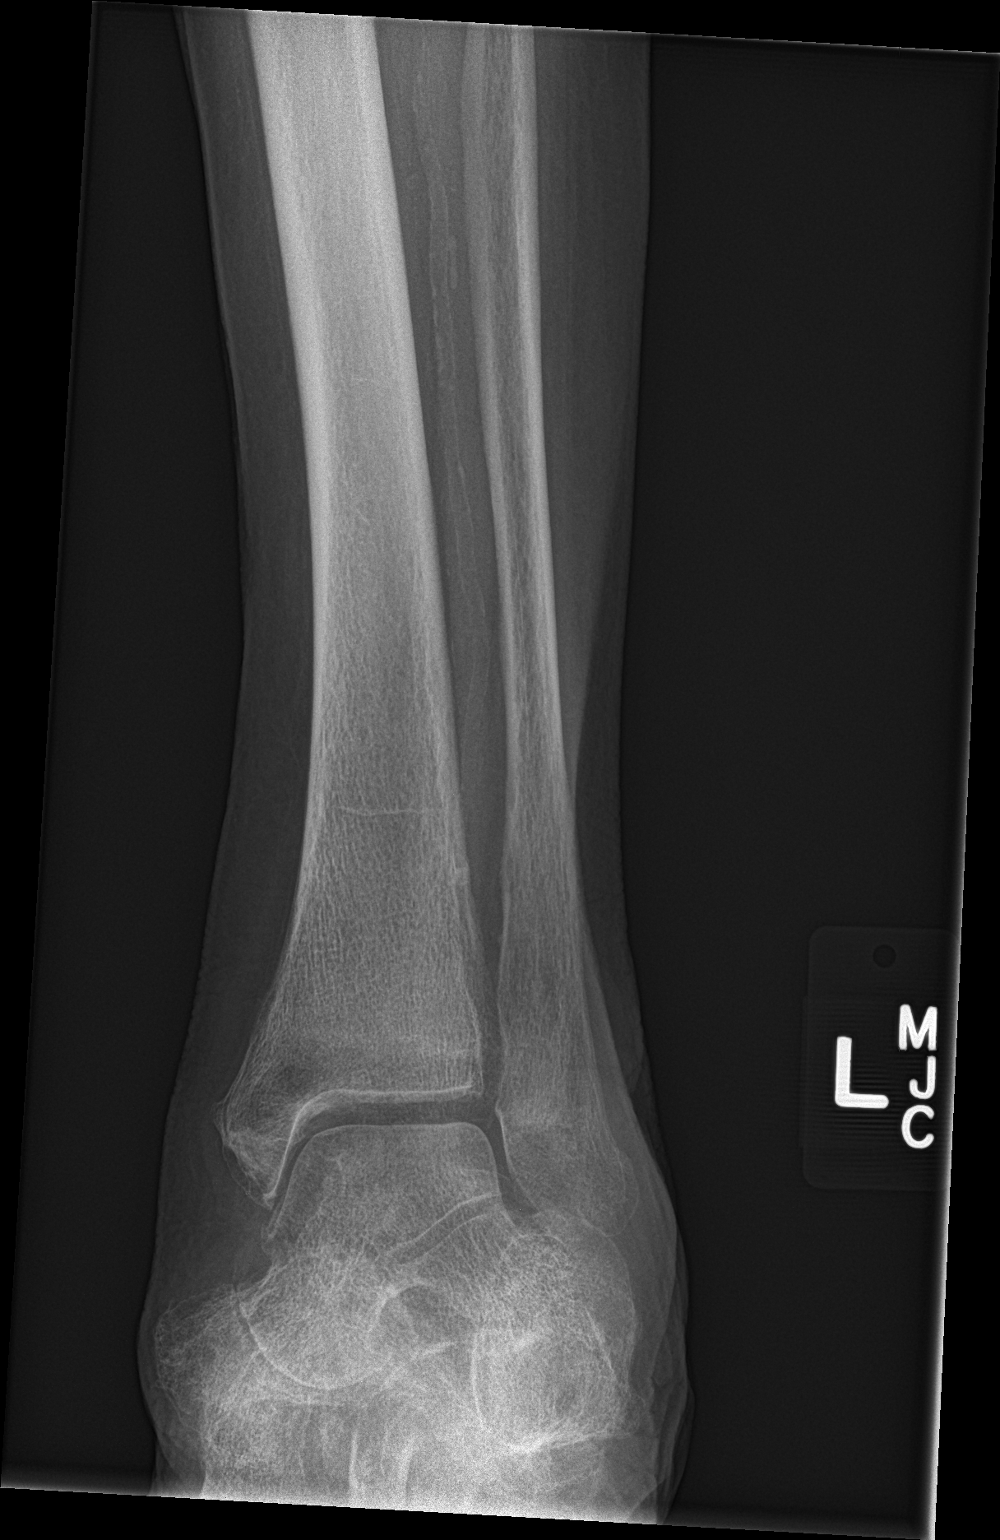

[ankle lat (2 of 2)]
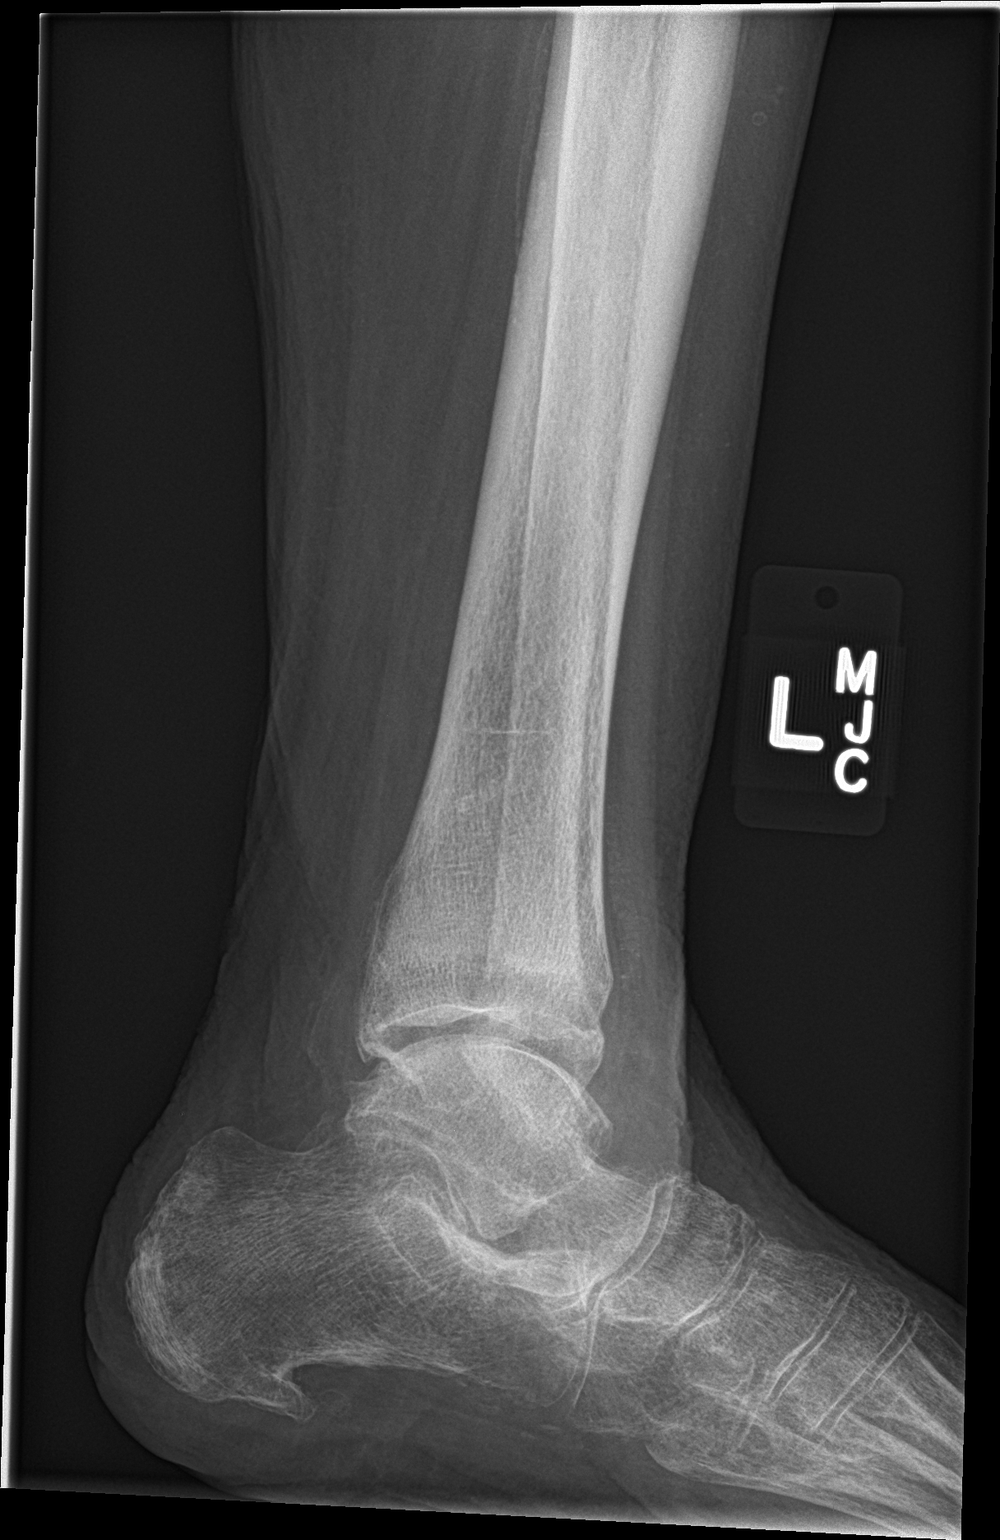

[3 of 3 positions shown; findings below may reference images not displayed]

FINDINGS: No acute fracture or dislocation. The ankle mortise is intact. There
is osteopenia with degenerative changes. The soft tissues are
unremarkable. No radiopaque foreign object. A 7 mm calcaneal spur.
IMPRESSION: No acute fracture or dislocation.

## 2018-12-20 IMAGING — CR DG ABD PORTABLE 1V
1 series · 1 of 1 positions shown · non-contrast
Comparison: Portable exam 6051 hours compared 03/23/2016

CLINICAL DATA: Abdominal distention and pain, question diarrhea,
history hypertension, diabetes mellitus

EXAM:
PORTABLE ABDOMEN - 1 VIEW

[supine ap]
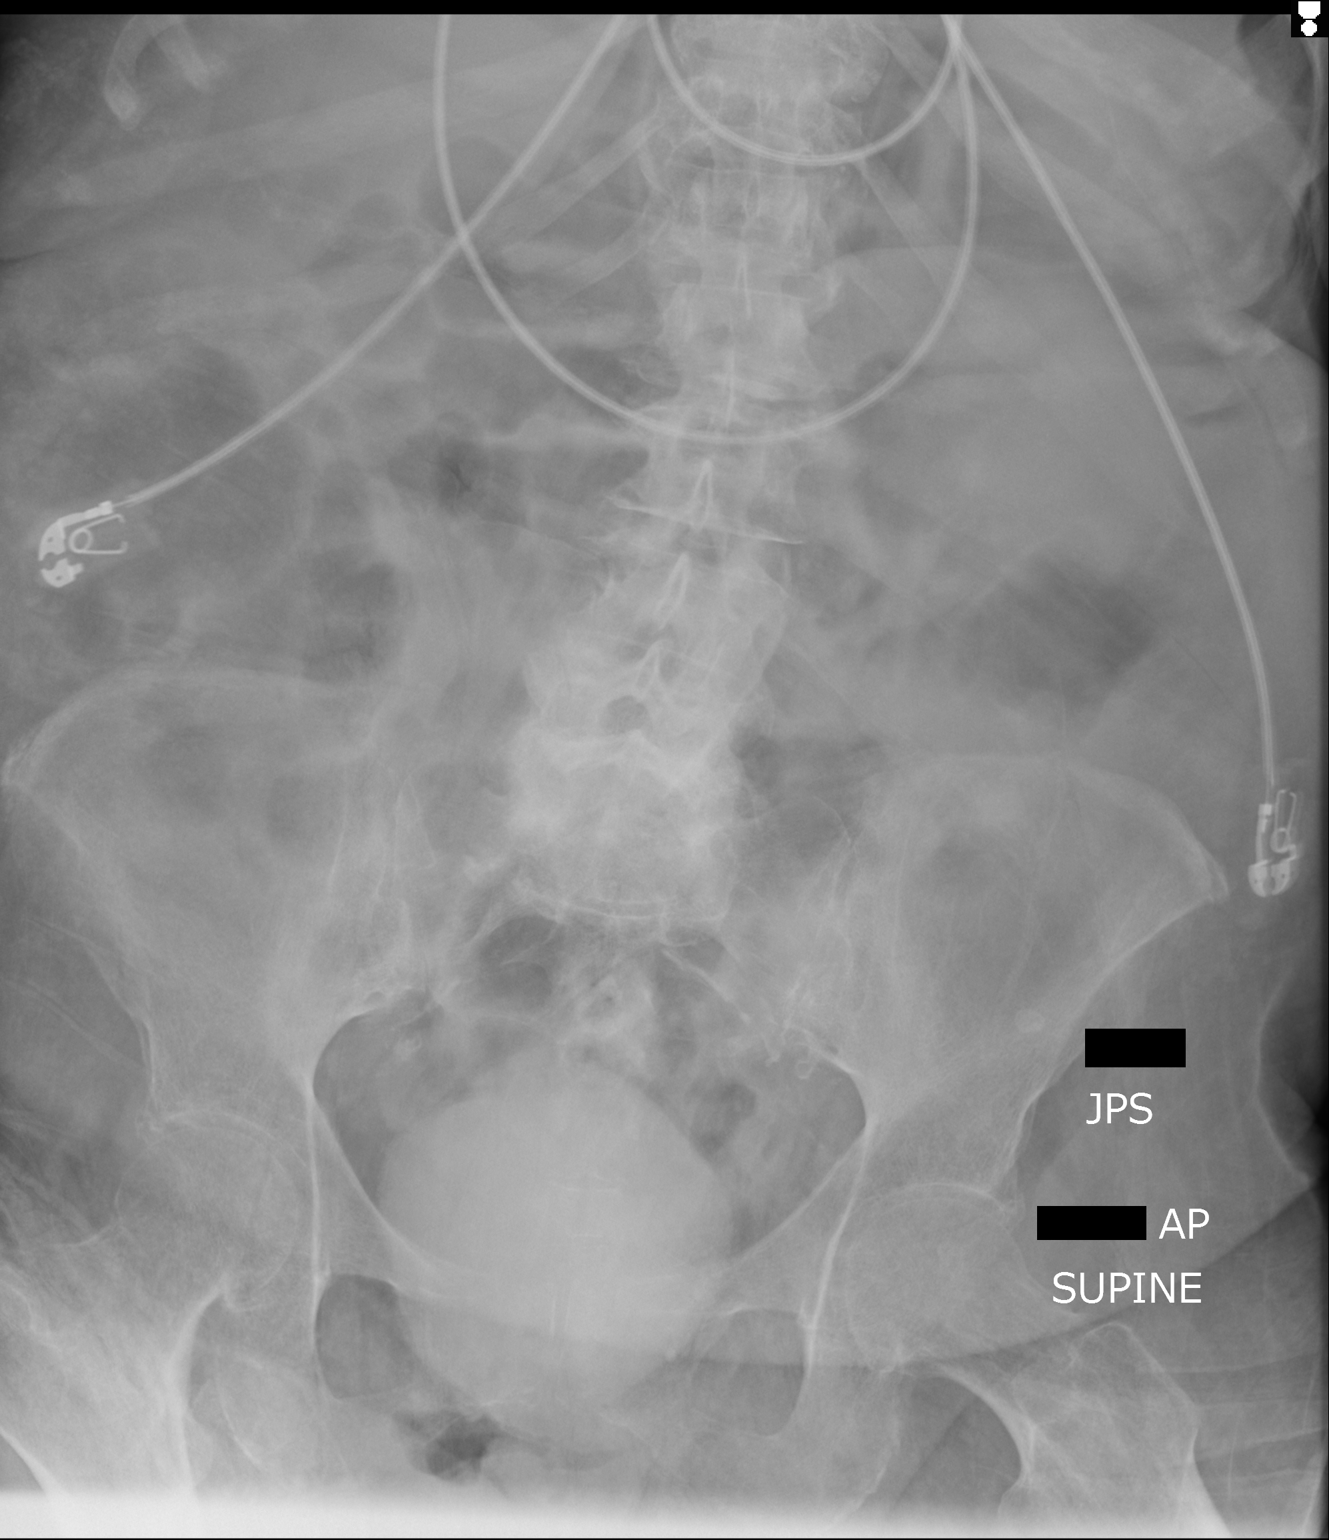

[1 of 1 positions shown; findings below may reference images not displayed]

FINDINGS: Decreased gaseous distention of bowel loops throughout the abdomen.

No bowel wall thickening or evidence of obstruction.

Excreted contrast within urinary bladder.

Atherosclerotic calcification aorta and iliac arteries bilaterally.

Bones demineralized.
IMPRESSION: Decreased gaseous distention of bowel since previous exam.

Aortic atherosclerosis.
